# Patient Record
Sex: Male | Born: 1965
Health system: Southern US, Community
[De-identification: ages and names within clinical notes are randomized; demographics above are authoritative.]

## PROBLEM LIST (undated history)

## (undated) DIAGNOSIS — G4730 Sleep apnea, unspecified: Secondary | ICD-10-CM

## (undated) DIAGNOSIS — I872 Venous insufficiency (chronic) (peripheral): Secondary | ICD-10-CM

## (undated) DIAGNOSIS — M1990 Unspecified osteoarthritis, unspecified site: Secondary | ICD-10-CM

## (undated) DIAGNOSIS — E669 Obesity, unspecified: Secondary | ICD-10-CM

## (undated) DIAGNOSIS — E119 Type 2 diabetes mellitus without complications: Secondary | ICD-10-CM

## (undated) DIAGNOSIS — I2699 Other pulmonary embolism without acute cor pulmonale: Secondary | ICD-10-CM

## (undated) DIAGNOSIS — I10 Essential (primary) hypertension: Secondary | ICD-10-CM

## (undated) DIAGNOSIS — M109 Gout, unspecified: Secondary | ICD-10-CM

## (undated) DIAGNOSIS — E785 Hyperlipidemia, unspecified: Secondary | ICD-10-CM

## (undated) HISTORY — PX: NO PAST SURGERIES: SHX2092

## (undated) HISTORY — DX: Sleep apnea, unspecified: G47.30

## (undated) HISTORY — DX: Type 2 diabetes mellitus without complications: E11.9

## (undated) HISTORY — DX: Hyperlipidemia, unspecified: E78.5

## (undated) HISTORY — DX: Venous insufficiency (chronic) (peripheral): I87.2

## (undated) HISTORY — DX: Unspecified osteoarthritis, unspecified site: M19.90

---

## 2002-12-24 ENCOUNTER — Emergency Department (HOSPITAL_COMMUNITY): Admission: EM | Admit: 2002-12-24 | Discharge: 2002-12-25 | Payer: Self-pay | Admitting: Emergency Medicine

## 2011-11-23 ENCOUNTER — Other Ambulatory Visit: Payer: Self-pay | Admitting: Neurology

## 2011-11-23 DIAGNOSIS — M48061 Spinal stenosis, lumbar region without neurogenic claudication: Secondary | ICD-10-CM

## 2011-11-23 DIAGNOSIS — M549 Dorsalgia, unspecified: Secondary | ICD-10-CM

## 2012-04-15 ENCOUNTER — Encounter (HOSPITAL_COMMUNITY): Payer: Self-pay | Admitting: *Deleted

## 2012-04-15 ENCOUNTER — Emergency Department (HOSPITAL_COMMUNITY)
Admission: EM | Admit: 2012-04-15 | Discharge: 2012-04-15 | Disposition: A | Discharge status: Home / Self Care | Payer: BC Managed Care – PPO | Attending: Emergency Medicine | Admitting: Emergency Medicine

## 2012-04-15 DIAGNOSIS — I8002 Phlebitis and thrombophlebitis of superficial vessels of left lower extremity: Secondary | ICD-10-CM

## 2012-04-15 DIAGNOSIS — M7989 Other specified soft tissue disorders: Secondary | ICD-10-CM

## 2012-04-15 DIAGNOSIS — M79609 Pain in unspecified limb: Secondary | ICD-10-CM | POA: Insufficient documentation

## 2012-04-15 DIAGNOSIS — Z7982 Long term (current) use of aspirin: Secondary | ICD-10-CM | POA: Insufficient documentation

## 2012-04-15 DIAGNOSIS — I10 Essential (primary) hypertension: Secondary | ICD-10-CM | POA: Insufficient documentation

## 2012-04-15 DIAGNOSIS — I8000 Phlebitis and thrombophlebitis of superficial vessels of unspecified lower extremity: Secondary | ICD-10-CM | POA: Insufficient documentation

## 2012-04-15 DIAGNOSIS — M109 Gout, unspecified: Secondary | ICD-10-CM | POA: Insufficient documentation

## 2012-04-15 DIAGNOSIS — Z79899 Other long term (current) drug therapy: Secondary | ICD-10-CM | POA: Insufficient documentation

## 2012-04-15 DIAGNOSIS — E669 Obesity, unspecified: Secondary | ICD-10-CM | POA: Insufficient documentation

## 2012-04-15 HISTORY — DX: Gout, unspecified: M10.9

## 2012-04-15 HISTORY — DX: Essential (primary) hypertension: I10

## 2012-04-15 HISTORY — DX: Obesity, unspecified: E66.9

## 2012-04-15 MED ORDER — IBUPROFEN 600 MG PO TABS: 600.0000 mg | ORAL_TABLET | Freq: Four times a day (QID) | ORAL | Status: DC | PRN

## 2012-04-15 NOTE — ED Provider Notes (Signed)
History   47 year old male with atraumatic right lower extremity pain. Gradual onset about 3 days ago. Patient currently feels a "knot" to the medial aspect of his shin. Mild swelling of the same leg. No acute numbness, tingling loss of strength. No fevers or chills. No history of diabetes. No intervention prior to Arrival.  CSN: [id number]  Arrival date & time 04/15/12  1143   First MD Initiated Contact with Patient 04/15/12 1303      Chief Complaint  Patient presents with  . Leg Swelling    (Consider location/radiation/quality/duration/timing/severity/associated sxs/prior treatment) HPI  Past Medical History  Diagnosis Date  . Gout   . Hypertension   . Obesity     History reviewed. No pertinent past surgical history.  History reviewed. No pertinent family history.  History  Substance Use Topics  . Smoking status: Not on file  . Smokeless tobacco: Not on file  . Alcohol Use: No      Review of Systems  All systems reviewed and negative, other than as noted in HPI.   Allergies  Review of patient's allergies indicates no known allergies.  Home Medications   Current Outpatient Rx  Name  Route  Sig  Dispense  Refill  . aspirin 81 MG tablet   Oral   Take 81 mg by mouth daily.         . Azilsartan Medoxomil (EDARBI) 80 MG TABS   Oral   Take 80 mg by mouth daily.         . Febuxostat (ULORIC) 80 MG TABS   Oral   Take 80 mg by mouth daily.         Marland Kitchen lovastatin (MEVACOR) 20 MG tablet   Oral   Take 20 mg by mouth at bedtime.           BP 138/83  Pulse 78  Temp(Src) 98.6 F (37 C) (Oral)  Resp 18  SpO2 96%  Physical Exam  Nursing note and vitals reviewed. Constitutional: He appears well-developed and well-nourished. No distress.  HENT:  Head: Normocephalic and atraumatic.  Eyes: Conjunctivae are normal. Right eye exhibits no discharge. Left eye exhibits no discharge.  Neck: Neck supple.  Cardiovascular: Normal rate, regular rhythm and  normal heart sounds.  Exam reveals no gallop and no friction rub.   No murmur heard. Pulmonary/Chest: Effort normal and breath sounds normal. No respiratory distress.  Abdominal: Soft. He exhibits no distension. There is no tenderness.  Musculoskeletal: He exhibits no edema and no tenderness.  Neurological: He is alert.  Skin: Skin is warm and dry.  Chronic appearing skin changes to bilateral shins. NO evidence of cellulitis. Right lower extremity swelling right, worse than left. Tenderness over the mid shin to the medial aspect. Good DP pulses bilaterally.   Psychiatric: He has a normal mood and affect. His behavior is normal. Thought content normal.    ED Course  Procedures (including critical care time)  Labs Reviewed - No data to display No results found.   1. Superficial phlebitis of left leg       MDM  47 year old male with atraumatic right lower extremity pain and swelling. Ultrasound significant for superficial phlebitis. No evidence of deep vein thrombosis. Plan NSAIDs and warm compresses.         Raeford Razor, MD 04/17/12 2008

## 2012-04-15 NOTE — Progress Notes (Signed)
VASCULAR LAB PRELIMINARY  PRELIMINARY  PRELIMINARY  PRELIMINARY  Right lower extremity venous Doppler completed.    Preliminary report:  There is no DVT noted in the right lower extremity.  There is superficial thrombophlebitis noted in a varicose vein from approximately 4 inches above ankle to mid calf.   KANADY, CANDACE, RVT 04/15/2012, 2:50 PM

## 2012-04-15 NOTE — ED Notes (Signed)
Pt reports having "knot" to right lower leg, having pain, redness, swelling and reports its warm to touch. States that his lower leg is normally discolored and swollen, but it is worse than it normally is. Ambulatory at triage.

## 2012-05-10 ENCOUNTER — Other Ambulatory Visit: Payer: Self-pay | Admitting: Neurology

## 2012-05-10 DIAGNOSIS — M549 Dorsalgia, unspecified: Secondary | ICD-10-CM

## 2012-05-10 DIAGNOSIS — M48061 Spinal stenosis, lumbar region without neurogenic claudication: Secondary | ICD-10-CM

## 2012-05-10 DIAGNOSIS — Z01818 Encounter for other preprocedural examination: Secondary | ICD-10-CM

## 2012-05-17 ENCOUNTER — Ambulatory Visit
Admission: RE | Admit: 2012-05-17 | Discharge: 2012-05-17 | Disposition: A | Discharge status: Home / Self Care | Payer: No Typology Code available for payment source | Source: Ambulatory Visit | Attending: Neurology | Admitting: Neurology

## 2012-05-17 ENCOUNTER — Ambulatory Visit
Admission: RE | Admit: 2012-05-17 | Discharge: 2012-05-17 | Disposition: A | Discharge status: Home / Self Care | Payer: Self-pay | Source: Ambulatory Visit | Attending: Neurology | Admitting: Neurology

## 2012-05-17 ENCOUNTER — Other Ambulatory Visit: Payer: Self-pay

## 2012-05-17 DIAGNOSIS — Z01818 Encounter for other preprocedural examination: Secondary | ICD-10-CM

## 2012-05-17 DIAGNOSIS — M549 Dorsalgia, unspecified: Secondary | ICD-10-CM

## 2012-05-17 DIAGNOSIS — M48061 Spinal stenosis, lumbar region without neurogenic claudication: Secondary | ICD-10-CM

## 2012-05-17 DIAGNOSIS — R209 Unspecified disturbances of skin sensation: Secondary | ICD-10-CM

## 2012-06-07 ENCOUNTER — Telehealth: Payer: Self-pay | Admitting: Neurology

## 2012-06-07 NOTE — Telephone Encounter (Signed)
Patient called to getrresults of MRI. Information provided with brief explanation of findings. Patient reports that he has had increased pain recently. Advised patient to schedule follow up appointment with increased symptoms. Patient would also like a copy of MRI results and will send in a medical release of information request.

## 2012-07-23 ENCOUNTER — Encounter: Payer: Self-pay | Admitting: Neurology

## 2012-07-23 ENCOUNTER — Ambulatory Visit (INDEPENDENT_AMBULATORY_CARE_PROVIDER_SITE_OTHER): Payer: BC Managed Care – PPO | Admitting: Neurology

## 2012-07-23 VITALS — BP 146/85 | HR 76 | Temp 98.1°F | Ht 72.0 in | Wt 391.0 lb

## 2012-07-23 DIAGNOSIS — M545 Low back pain, unspecified: Secondary | ICD-10-CM

## 2012-07-23 DIAGNOSIS — R200 Anesthesia of skin: Secondary | ICD-10-CM

## 2012-07-23 DIAGNOSIS — M549 Dorsalgia, unspecified: Secondary | ICD-10-CM | POA: Insufficient documentation

## 2012-07-23 DIAGNOSIS — G8929 Other chronic pain: Secondary | ICD-10-CM

## 2012-07-23 DIAGNOSIS — M48061 Spinal stenosis, lumbar region without neurogenic claudication: Secondary | ICD-10-CM

## 2012-07-23 NOTE — Progress Notes (Signed)
[facility]  PATIENT: Earl Donovan DOB: 05-11-1965   HISTORY FROM: patient, chart REASON FOR VISIT: routine follow up  HISTORY OF PRESENT ILLNESS:  47 year old morbidly obese Caucasian male with chronic low back pain and numbness likely of musculoskeletal etiology but onset with exertion and relief with rest raises concern for spinal stenosis.  UPDATE 01/25/12: He returns for f/u after last visit on 11/22/11.  He admits to noticing improvement in his low back pain and numbness while he was doing regular back exercises.  He had slight cough in the last month and noticed worsening of his back pain again.  He has not scheduled MRI scan of his spine yet due to financial constraints and may do so next year.  He had a NCV/EMG study on 11/24/11 which showed no evidence of neuropathy or radiculopathy.  He states he has been on diet and trying to lose weight.  He has no new complaints today.  UPDATE 07/23/12: He returns for f/u visit to get results of MRI lumbar spine.  He has no new complaints.  He admits his back felt better when he did back exercises 4-5 days per week but he has only been doing them once or twice a week for the last few months.  His weight has increased about five lbs. since last visit.  He thinks late-night eating is one of his barriers.  He states he knows he needs to loose weight but he has trouble sticking to a diet.  He states he has trouble exercising because his back hurts and goes numb with exertion.  He denies pain or numbness in his legs or feet.  He states he has frequent loose stools with occasional blood (not bright red) intermixed with stool.  REVIEW OF SYSTEMS: Full 14 system review of systems performed and notable only for: endocrine: flushing gastrointestinal: blood in stool sleep: snoring   ALLERGIES: No Known Allergies  HOME MEDICATIONS: Outpatient Prescriptions Prior to Visit  Medication Sig Dispense Refill  . aspirin 81 MG tablet Take  81 mg by mouth daily.      . Febuxostat (ULORIC) 80 MG TABS Take 80 mg by mouth daily.      Marland Kitchen lovastatin (MEVACOR) 20 MG tablet Take 20 mg by mouth at bedtime.      . Azilsartan Medoxomil (EDARBI) 80 MG TABS Take 80 mg by mouth daily.      Marland Kitchen ibuprofen (ADVIL,MOTRIN) 600 MG tablet Take 1 tablet (600 mg total) by mouth every 6 (six) hours as needed for pain.  30 tablet  0   No facility-administered medications prior to visit.    PAST MEDICAL HISTORY: Past Medical History  Diagnosis Date  . Gout   . Hypertension   . Obesity     PAST SURGICAL HISTORY: No past surgical history on file.  FAMILY HISTORY: Family History  Problem Relation Age of Onset  . Arthritis Mother   . Cancer Mother   . Cancer Father   . Asthma Maternal Grandmother   . Asthma Maternal Grandfather     SOCIAL HISTORY: History   Social History  . Marital Status: Married    Spouse Name: N/A    Number of Children: N/A  . Years of Education: N/A   Occupational History  . Not on file.   Social History Main Topics  . Smoking status: Never Smoker   . Smokeless tobacco: Not on file  . Alcohol Use: No  . Drug Use: No  . Sexually  Active: Not on file   Other Topics Concern  . Not on file   Social History Narrative  . No narrative on file     PHYSICAL EXAM  Filed Vitals:   07/23/12 1426  BP: 146/85  Pulse: 76  Temp: 98.1 F (36.7 C)  TempSrc: Oral  Height: 6' (1.829 m)  Weight: 391 lb (177.356 kg)   Body mass index is 53.02 kg/(m^2).  GENERAL EXAM: Patient is in no distress, well developed and well groomed. HEAD: Symmetric facial features. EARS, NOSE, and THROAT: Normal.  NECK: Supple, no JVD RESPIRATORY: Lungs CTA. CARDIOVASCULAR: Regular rate and rhythm, no murmurs, no carotid bruits SKIN: No rash, no bruising  NEUROLOGIC: MENTAL STATUS: awake, alert and oriented to person, place and time, language fluent, comprehension intact, naming intact CRANIAL NERVE: pupils equal and reactive to  light, visual fields full to confrontation, extraocular muscles intact, no nystagmus, facial sensation and strength symmetric, uvula midline, shoulder shrug symmetric, tongue midline. MOTOR: normal bulk and tone, full strength in the BUE, BLE, fine finger movements normal. SENSORY: normal and symmetric to light touch, pinprick, temperature, vibration  COORDINATION: finger-nose-finger normal REFLEXES: deep tendon reflexes present and symmetric 2+,  GAIT/STATION: narrow based gait; able to walk on toes, heels.  Tandem UNSTEADY; romberg is negative.     DIAGNOSTIC DATA (LABS, IMAGING, TESTING) - I reviewed patient records, labs, notes, testing and imaging myself where available.   11/23/11: MRI lumbar spine without This MRI scan of the lumbar spine shows only mild disc desiccation changes at L3-4 and L4-5 and minor facet hypertrophy but no significant compression.   ASSESSMENT AND PLAN  47 year old morbidly obese Caucasian male with chronic low back pain and numbness of musculoskeletal etiology.  Workup for spinal stenosis negative.  Plan:  I have reviewed results from MRI lumbar spine with patient.  Advised patient to do regular back stretching exercises 4-5 days per week, lose weight with lifestyle modifications including daily exercise. Suggested stationary bicycling, walking or working out in the pool as warm water may soothe the back and provide non-weight-bearing exercise.  Encouraged patient to start slow and build up exercise tolerance.  Advised him to make dietary changes to reduce caloric intake and cut out late-night eating. And lose weight.  Encouraged patient to discuss GI problems with his PCP.  No future appointment scheduled.   LYNN LAM NP-C 07/23/2012, 3:09 PM  [facility] [address] [place], [place] [address] [phone]  I have personally examined this patient, reviewed pertinent data, developed plan of care and discussed with patient and  agree with above. Delia Heady, MD

## 2012-07-23 NOTE — Patient Instructions (Signed)
Return as needed.  No future appointment needed.

## 2013-08-20 IMAGING — CR DG ORBITS FOR FOREIGN BODY
2 series · 2 of 2 positions shown · non-contrast
Comparison: None.

CLINICAL DATA: Pre MRI; history of previous metal work

ORBITS FOR FOREIGN BODY - 2 VIEW

[view not recorded (1 of 2)]
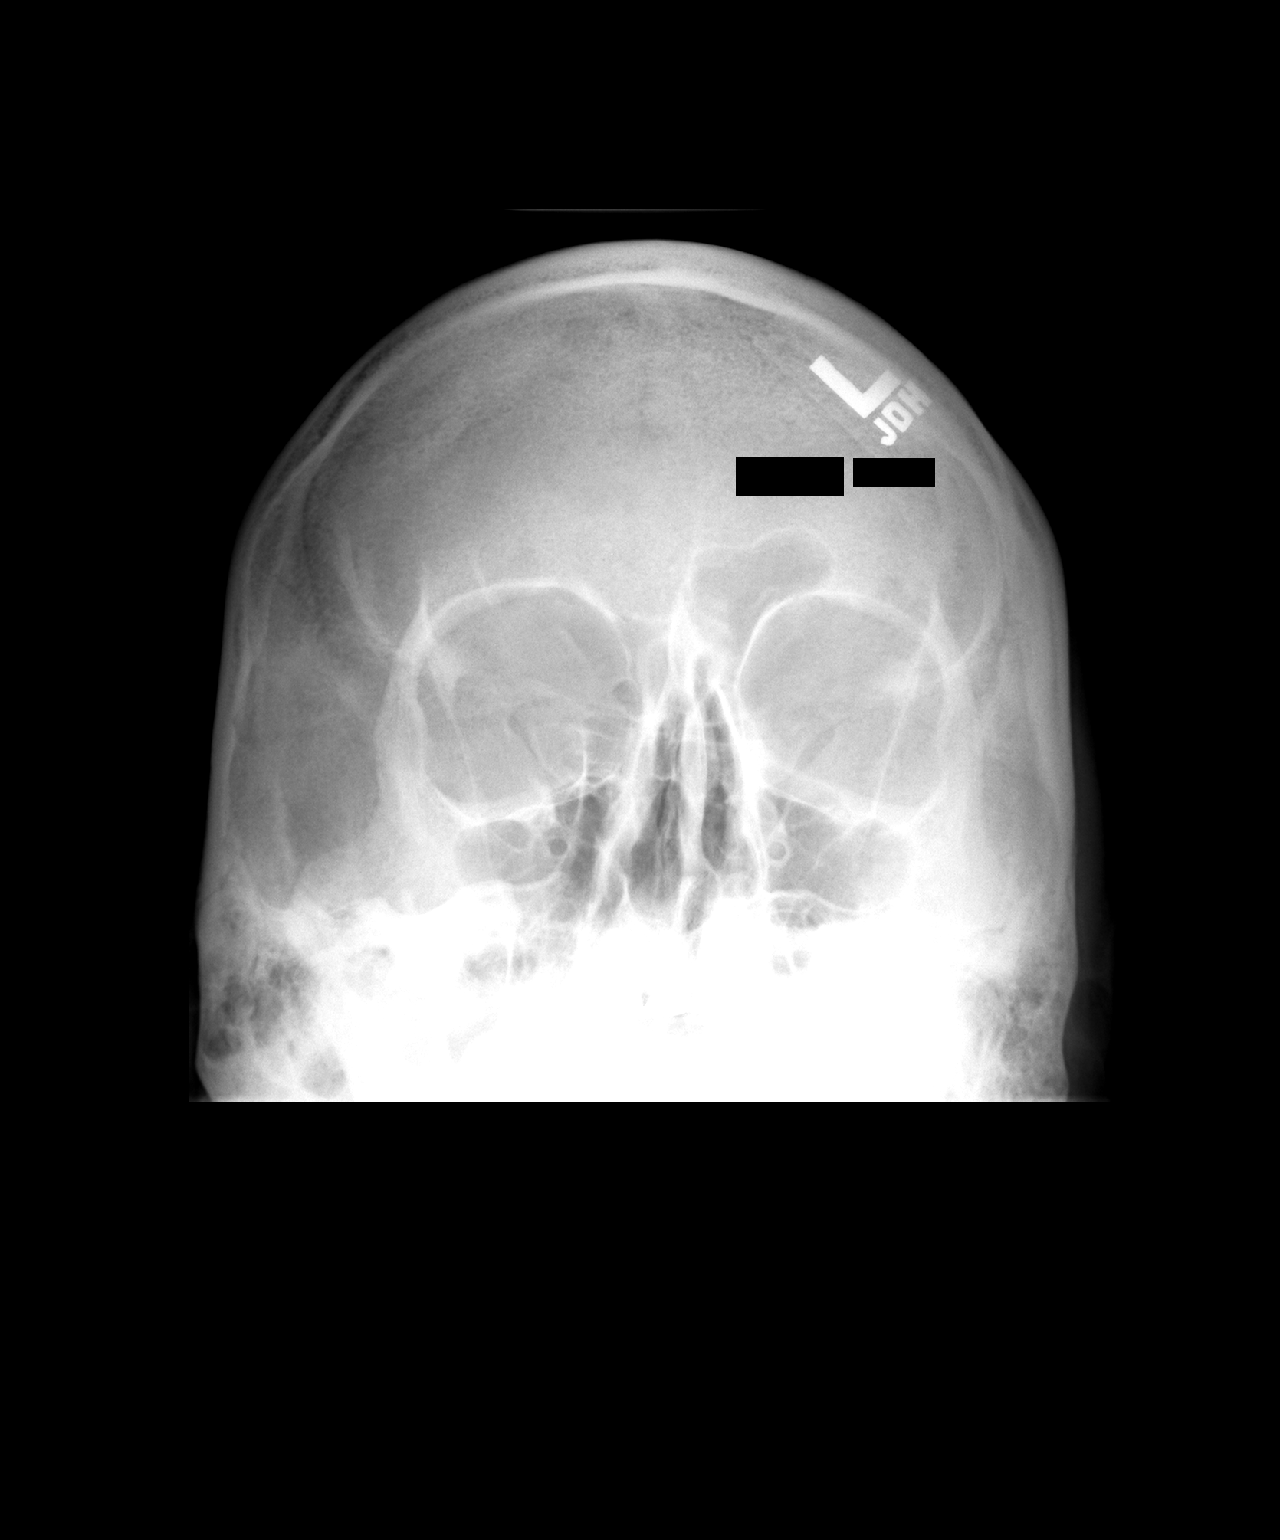

[view not recorded (2 of 2)]
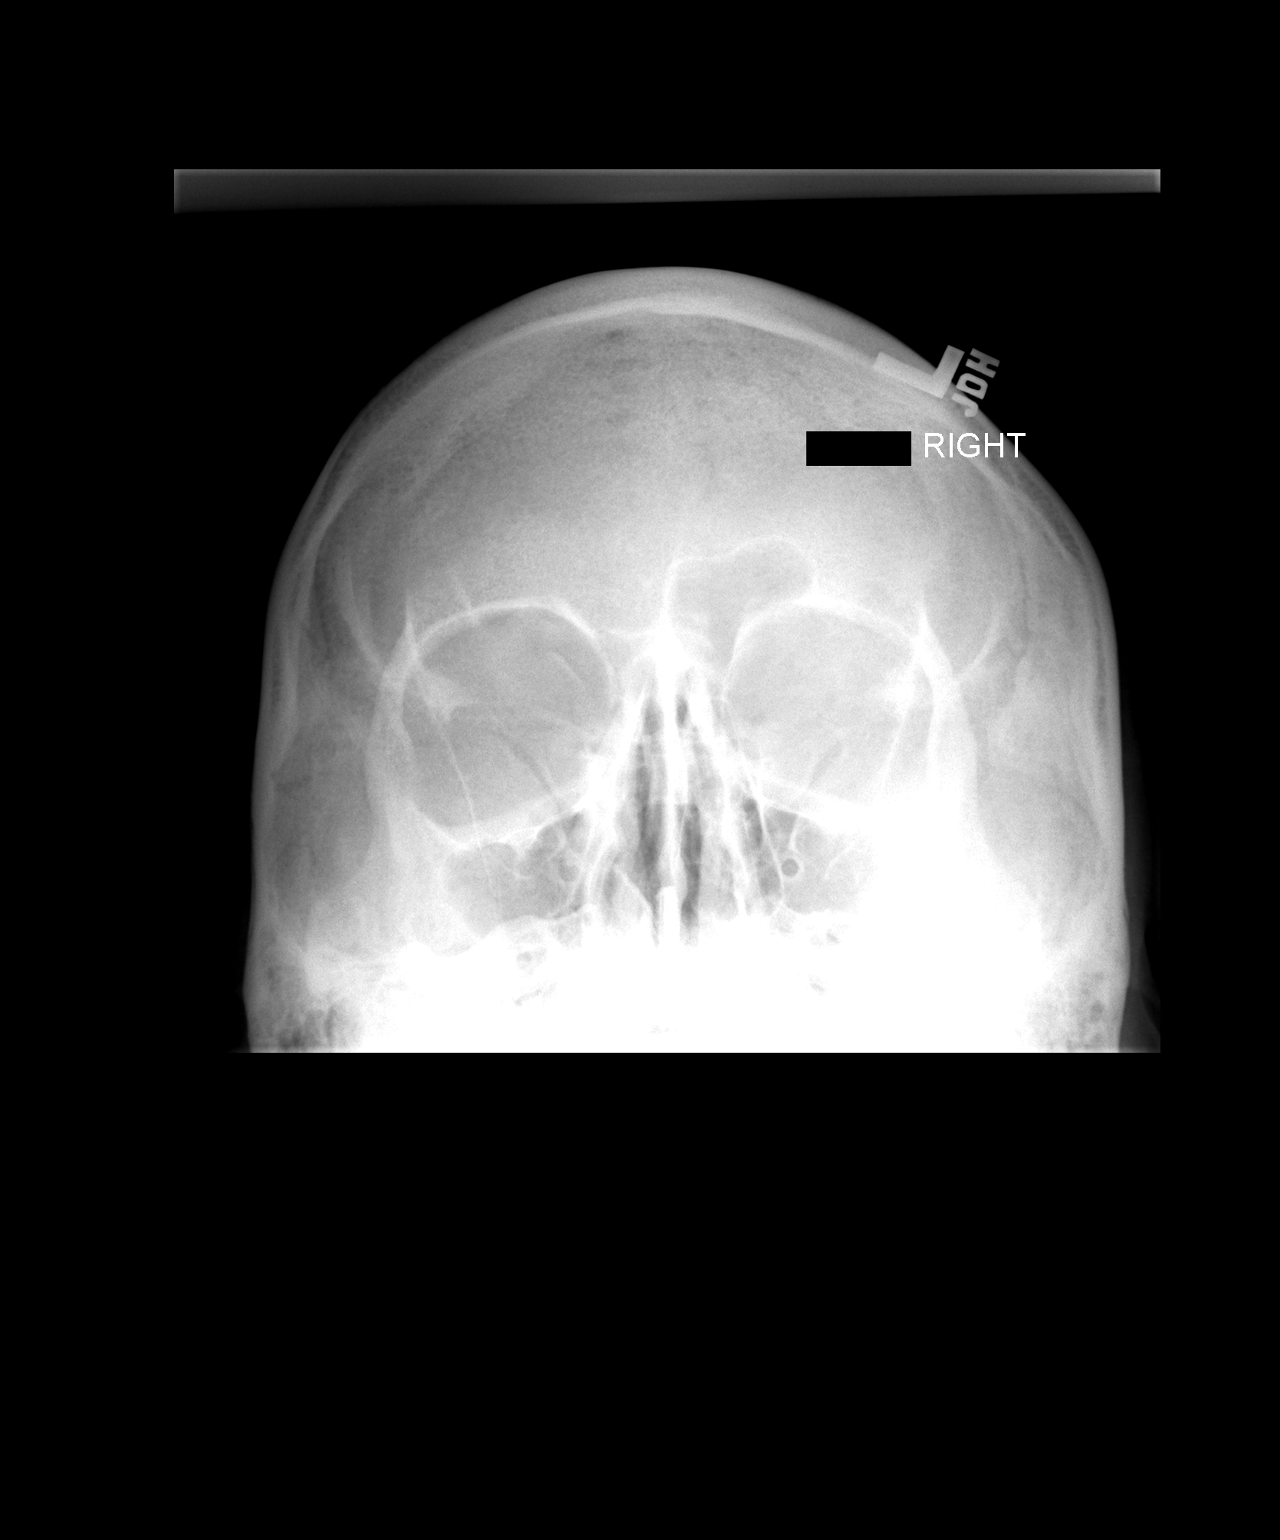

[2 of 2 positions shown; findings below may reference images not displayed]

FINDINGS: Water's views with eyes deviated toward the left and
toward the right were obtained.  There is no intraorbital
radiopaque foreign body.  Frontal sinuses are hypoplastic.  Aerated
paranasal sinuses appear clear.  No fracture or dislocation.
IMPRESSION: No intraorbital radiopaque foreign body.

## 2015-07-31 ENCOUNTER — Encounter: Payer: Self-pay | Admitting: Family Medicine

## 2015-07-31 ENCOUNTER — Ambulatory Visit (INDEPENDENT_AMBULATORY_CARE_PROVIDER_SITE_OTHER): Payer: BLUE CROSS/BLUE SHIELD | Admitting: Family Medicine

## 2015-07-31 VITALS — BP 140/90 | HR 66 | Temp 98.4°F | Resp 12 | Ht 72.0 in | Wt 395.4 lb

## 2015-07-31 DIAGNOSIS — E785 Hyperlipidemia, unspecified: Secondary | ICD-10-CM

## 2015-07-31 DIAGNOSIS — I8311 Varicose veins of right lower extremity with inflammation: Secondary | ICD-10-CM

## 2015-07-31 DIAGNOSIS — G4733 Obstructive sleep apnea (adult) (pediatric): Secondary | ICD-10-CM | POA: Insufficient documentation

## 2015-07-31 DIAGNOSIS — I872 Venous insufficiency (chronic) (peripheral): Secondary | ICD-10-CM | POA: Insufficient documentation

## 2015-07-31 DIAGNOSIS — I8312 Varicose veins of left lower extremity with inflammation: Secondary | ICD-10-CM

## 2015-07-31 DIAGNOSIS — M549 Dorsalgia, unspecified: Secondary | ICD-10-CM

## 2015-07-31 DIAGNOSIS — Z9989 Dependence on other enabling machines and devices: Secondary | ICD-10-CM

## 2015-07-31 DIAGNOSIS — M109 Gout, unspecified: Secondary | ICD-10-CM | POA: Insufficient documentation

## 2015-07-31 DIAGNOSIS — Z6841 Body Mass Index (BMI) 40.0 and over, adult: Secondary | ICD-10-CM

## 2015-07-31 DIAGNOSIS — I10 Essential (primary) hypertension: Secondary | ICD-10-CM | POA: Diagnosis not present

## 2015-07-31 DIAGNOSIS — M1009 Idiopathic gout, multiple sites: Secondary | ICD-10-CM

## 2015-07-31 DIAGNOSIS — Z1211 Encounter for screening for malignant neoplasm of colon: Secondary | ICD-10-CM | POA: Diagnosis not present

## 2015-07-31 DIAGNOSIS — G8929 Other chronic pain: Secondary | ICD-10-CM

## 2015-07-31 LAB — BASIC METABOLIC PANEL
BUN: 12 mg/dL (ref 6–23)
CO2: 30 mEq/L (ref 19–32)
Calcium: 9.6 mg/dL (ref 8.4–10.5)
Chloride: 101 mEq/L (ref 96–112)
Creatinine, Ser: 0.88 mg/dL (ref 0.40–1.50)
GFR: 97.33 mL/min (ref >60.00)
Glucose, Bld: 133 mg/dL — ABNORMAL HIGH (ref 70–99)
Potassium: 4 mEq/L (ref 3.5–5.1)
Sodium: 137 mEq/L (ref 135–145)

## 2015-07-31 NOTE — Patient Instructions (Signed)
A few things to remember from today's visit:   1. Venous stasis dermatitis of both lower extremities   2. BMI 50.0-59.9, adult (HCC)   3. Essential hypertension, benign  - Basic Metabolic Panel  4. Colon cancer screening  - Ambulatory referral to Gastroenterology  5. Gout, arthropathy   6. Hyperlipidemia   7. Back pain, chronic    Blood pressure goal for most people is less than 140/90.Some populations (older than 60) the goal is less than 150/90.  Elevated blood pressure increases the risk of strokes, heart and kidney disease, and eye problems. Regular physical activity and a healthy diet (DASH diet) usually help. Low salt diet. Take medications as instructed. Caution with some over the counter medications as cold medications, dietary products (for weight loss), and Ibuprofen or Aleve (frequent use);all these medications could cause elevation of blood pressure.   No changes in current medications. Continue healthy diet and regular physical activity. I will see you back in 4 months, then we can repeat cholesterol lab and check for diabetes.          If you sign-up for My chart, you can communicate easier with us in case you have any question or concern.

## 2015-07-31 NOTE — Progress Notes (Signed)
Pre visit review using our clinic review tool, if applicable. No additional management support is needed unless otherwise documented below in the visit note. 

## 2015-07-31 NOTE — Progress Notes (Signed)
HPI:   [name] is a 50 y.o. male, who is here today to establish care with me.  Former PCP: Dr Orvan Falconerampbell. Last preventive routine visit: 02/2015.  Hx of HTN,gout,HLD,lower back pain, and obesity among some.  Concerns today: LE edema for years, worse at the end of the day. Elevation and compression stocking help. Stable. He states that his wife whnted him to mention it.  04/2012: Lower ext venous Doppler:No evidence of deep vein thrombosis involving the right  lower extremity. There is superficial thrombophlebitis  noted in a varicose vein approximately 4 inches above calf  extending to mid calf. - No evidence of Baker's cyst on the right.   Hypertension: Dx 10 years ago.  Reporting labs done last in 02/2015. + OSA, started CPaP about a month ago. Currently he is on Losartan 100 mg daily.  Exercising and following a healthy/low salt diet.   He has not noted unusual headache, visual changes, exertional chest pain,dyspnea, focal weakness, or worsening edema. Tolerating medications well with no major side effects reported.   BP reading at home.140-150/80's. Started exercising and following a healthier diet for the past 2 weeks.   Reporting Hx of pre-diabetes.    Back pain:  4-5 years, intermittent, no radiated. He has followed with neurosurgeon in 2013.  05/2012: Lumbar WUJ:WJXBRI:This MRI scan of the lumbar spine shows only mild disc desiccation changes at L3-4 and L4-5 and minor facet hypertrophy but no significant compression.  07/2012: He had a NCV/EMG study on 11/24/11 which showed no evidence of neuropathy or radiculopathy   Pain is not associated LE numbness, tingling, urinary incontinence or retention, stool incontinence, or saddle anesthesia.  No rash or edema on area, fever, chills, or abnormal wt loss.  Exacerbated by prolonged walking, pain is mild and does not limit daily activities. + lower back numbness also when prolonged walking, it is  moderated and this is the reason he stopped exercising before. He has not had any since he resume exercise. Alleviated by bending forward.  Gout: last 7 years ago when he started Uloric 80 mg daily.He tried Allopurinol first but did not help.    HLD:  He was on Lovastatin, he discontinued, he states that he does not like to take medications if he does not feel like it is needed. He is trying to do better with healthy diet. Denies any Hx of CVD. Currently on Aspirin.     Review of Systems  Constitutional: Negative for fever, activity change, appetite change, fatigue and unexpected weight change.  HENT: Negative for nosebleeds, sore throat and trouble swallowing.   Eyes: Negative for pain, redness and visual disturbance.  Respiratory: Positive for apnea (on CPaP). Negative for cough, shortness of breath and wheezing.   Cardiovascular: Positive for leg swelling. Negative for chest pain and palpitations.  Gastrointestinal: Negative for nausea, vomiting, abdominal pain and constipation.       + Has had blood on tissue after defecation, has not had any in 6 months.  No changes in bowel habits.  Endocrine: Negative for polydipsia, polyphagia and polyuria.  Genitourinary: Negative for dysuria, hematuria and decreased urine volume.  Musculoskeletal: Positive for back pain. Negative for myalgias, arthralgias and gait problem.  Skin: Positive for rash (LE, stable.). Negative for wound.  Neurological: Negative for dizziness, seizures, syncope, weakness and headaches.  Psychiatric/Behavioral: Negative for confusion. The patient is not nervous/anxious.       Current Outpatient Prescriptions on File Prior to Visit  Medication  Sig Dispense Refill  . aspirin 81 MG tablet Take 81 mg by mouth daily.    . Febuxostat (ULORIC) 80 MG TABS Take 80 mg by mouth daily.     No current facility-administered medications on file prior to visit.     Past Medical History  Diagnosis Date  . Gout   .  Hypertension   . Obesity    No Known Allergies  Family History  Problem Relation Age of Onset  . Arthritis Mother   . Cancer Mother   . Cancer Father   . Asthma Maternal Grandmother   . Asthma Maternal Grandfather     Social History   Social History  . Marital Status: Married    Spouse Name: N/A  . Number of Children: N/A  . Years of Education: N/A   Social History Main Topics  . Smoking status: Never Smoker   . Smokeless tobacco: None  . Alcohol Use: No  . Drug Use: No  . Sexual Activity: Not Asked   Other Topics Concern  . None   Social History Narrative    Filed Vitals:   07/31/15 1301  BP: 140/90  Pulse: 66  Temp: 98.4 F (36.9 C)    Body mass index is 53.61 kg/(m^2).   SpO2 Readings from Last 3 Encounters:  07/31/15 97%  04/15/12 97%     Physical Exam  Constitutional: He is oriented to person, place, and time. He appears well-developed. No distress.  HENT:  Head: Atraumatic.  Mouth/Throat: Oropharynx is clear and moist and mucous membranes are normal.  Eyes: Conjunctivae and EOM are normal. Pupils are equal, round, and reactive to light.  Neck: No thyromegaly present.  Cardiovascular: Normal rate and regular rhythm.   No murmur heard. Pulses:      Dorsalis pedis pulses are 2+ on the right side, and 2+ on the left side.  Respiratory: Effort normal and breath sounds normal. No respiratory distress.  GI: Soft. He exhibits no mass. There is no hepatomegaly. There is no tenderness.  Musculoskeletal: He exhibits no edema or tenderness.  No significant deformity appreciated. No tenderness upon palpation of paraspinal muscles. Pain is not elicited with movement on exam table during examination.    Lymphadenopathy:    He has no cervical adenopathy.  Neurological: He is alert and oriented to person, place, and time. He has normal strength. Coordination and gait normal.  SLR negative bilateral.  Skin: Skin is warm. Rash (Hyperpigmentation changes,  pretibial bilateral.Scaly skin, no wounds or drainage.) noted. No lesion noted. No erythema.  Psychiatric: He has a normal mood and affect. His speech is normal.  Well groomed, good eye contact.      ASSESSMENT AND PLAN:    Saba was seen today for new patient (initial visit).  Diagnoses and all orders for this visit:  Venous stasis dermatitis of both lower extremities  Stable. Skin care discussed, moisturized with Vaseline at bedtime. LE elevation and compression stockings. I do not think further study is needed for now.  Essential hypertension, benign  Slightly above goal, ideally < 140/90. No changes in current management. DASH diet recommended. Eye exam recommended annually. F/U in 4 months, before if needed. Instructed to continue monitoring BP at home and bring readings.  BMI 50.0-59.9, adult (HCC)  We discussed benefits of wt loss as well as adverse effects of obesity. Consistency with healthy diet and physical activity recommended. Weight Watchers is a good option as well as daily brisk walking for 15-30 min as tolerated.  Colon cancer screening  Vs Dx, reporting occasional blood in stool, none in 6 months. FHx negative for colon cancer.  -     Ambulatory referral to Gastroenterology  Gout, arthropathy  Asymptomatic. No changes in current management. Some side effects of Uloric discussed. F/U in 4 months.  Hyperlipidemia  Low fat diet to continue for now. FLP next OV.   Back pain, chronic  Stable and reported as mild. Continue stretching exercises. Wt loss may help. Monitor for warning signs. F/U annually, before if needed.        -PMHx reviewed. -Will obtain records from former PCP.        Betty G. [place], MD  [facility]. [place] office.

## 2015-08-14 ENCOUNTER — Encounter: Payer: Self-pay | Admitting: Family Medicine

## 2015-09-13 ENCOUNTER — Encounter (HOSPITAL_COMMUNITY): Payer: Self-pay

## 2015-09-13 ENCOUNTER — Emergency Department (HOSPITAL_COMMUNITY)
Admission: EM | Admit: 2015-09-13 | Discharge: 2015-09-13 | Disposition: A | Discharge status: Home / Self Care | Payer: BLUE CROSS/BLUE SHIELD | Attending: Emergency Medicine | Admitting: Emergency Medicine

## 2015-09-13 DIAGNOSIS — M545 Low back pain, unspecified: Secondary | ICD-10-CM

## 2015-09-13 DIAGNOSIS — Z79899 Other long term (current) drug therapy: Secondary | ICD-10-CM | POA: Insufficient documentation

## 2015-09-13 DIAGNOSIS — I10 Essential (primary) hypertension: Secondary | ICD-10-CM | POA: Diagnosis not present

## 2015-09-13 DIAGNOSIS — Z7982 Long term (current) use of aspirin: Secondary | ICD-10-CM | POA: Diagnosis not present

## 2015-09-13 MED ORDER — CYCLOBENZAPRINE HCL 10 MG PO TABS: 10.0000 mg | ORAL_TABLET | Freq: Two times a day (BID) | ORAL | 0 refills | Status: DC | PRN

## 2015-09-13 MED ORDER — NAPROXEN 500 MG PO TABS: 500.0000 mg | ORAL_TABLET | Freq: Two times a day (BID) | ORAL | 0 refills | Status: DC

## 2015-09-13 MED ORDER — HYDROCODONE-ACETAMINOPHEN 5-325 MG PO TABS
2.0000 | ORAL_TABLET | Freq: Once | ORAL | Status: AC
  Administered 2015-09-13: 2 via ORAL
  Filled 2015-09-13: qty 2

## 2015-09-13 NOTE — ED Triage Notes (Signed)
Pt states twisted back this am. Denies any fall/trauma. Denies any radiation to legs. Pt states central low back pain.

## 2015-09-13 NOTE — ED Provider Notes (Signed)
[facility]-EMERGENCY DEPT Provider Note   CSN: [id number] Arrival date & time: 09/13/15  2036  First Provider Contact: 11:08 PM   By signing my name below, I, Vista Mink, attest that this documentation has been prepared under the direction and in the presence of [facility]  Electronically Signed: Vista Mink, ED Scribe. 09/14/15. 12:07 AM.   History   Chief Complaint Chief Complaint  Patient presents with  . Back Pain    HPI HPI Comments: Earl Donovan is a 50 y.o. Male with a PMHx of chronic back pain, who presents to the Emergency Department complaining of sudden onset, middle to lower back pain that occurred this morning. Pt was getting ready to go play golf this morning when he twisted and bent down and reports sudden onset of pain to his back ever since. Pt also states he is in no significant pain while resting but reports pain is exacerbated upon ambulation and certain movements. Pt states he took 1 Hydrocodone from an old prescription prior to arrival, around 1900, and reports mild relief. Pt reports Hx of chronic back pain but none similar to this episode. Pt states he has had MRI's of his back in the past, none showed significant findings. Pt denies any numbness or tingling in lower extremities, bowel or bladder incontinence, urinary symptoms, abdominal pain. Pt further denies Hx of IV drug use, GI issues  The history is provided by the patient. No language interpreter was used.   Past Medical History:  Diagnosis Date  . Gout   . Hypertension   . Obesity     Patient Active Problem List   Diagnosis Date Noted  . Hyperlipidemia 07/31/2015  . Gout, arthropathy 07/31/2015  . BMI 50.0-59.9, adult (HCC) 07/31/2015  . Essential hypertension, benign 07/31/2015  . Venous stasis dermatitis of both lower extremities 07/31/2015  . OSA on CPAP 07/31/2015  . Spinal stenosis of lumbar region 07/23/2012  . Numbness 07/23/2012  . Back pain, chronic 07/23/2012     History reviewed. No pertinent surgical history.     Home Medications    Prior to Admission medications   Medication Sig Start Date End Date Taking? Authorizing Provider  aspirin 81 MG tablet Take 81 mg by mouth daily.    Historical Provider, MD  cyclobenzaprine (FLEXERIL) 10 MG tablet Take 1 tablet (10 mg total) by mouth 2 (two) times daily as needed for muscle spasms. 09/13/15   Jerre Simon, PA  Febuxostat (ULORIC) 80 MG TABS Take 80 mg by mouth daily.    Historical Provider, MD  losartan (COZAAR) 100 MG tablet Take 100 mg by mouth daily.    Historical Provider, MD  naproxen (NAPROSYN) 500 MG tablet Take 1 tablet (500 mg total) by mouth 2 (two) times daily. 09/13/15   Jerre Simon, PA    Family History Family History  Problem Relation Age of Onset  . Arthritis Mother   . Cancer Mother   . Cancer Father   . Asthma Maternal Grandmother   . Asthma Maternal Grandfather     Social History Social History  Substance Use Topics  . Smoking status: Never Smoker  . Smokeless tobacco: Never Used  . Alcohol use No     Allergies   Review of patient's allergies indicates no known allergies.   Review of Systems Review of Systems  Constitutional: Negative for fever.  Gastrointestinal: Negative for abdominal pain.  Genitourinary: Negative for frequency and urgency.  Musculoskeletal: Positive for back pain (lower mid back).  Neurological: Negative for numbness.     Physical Exam Updated Vital Signs BP 138/76   Pulse 62   Temp 98.2 F (36.8 C) (Oral)   Resp 18   Ht 6' (1.829 m)   Wt (!) 385 lb (174.6 kg)   SpO2 95%   BMI 52.22 kg/m   Physical Exam  Constitutional: He appears well-developed and well-nourished. No distress.  HENT:  Head: Normocephalic and atraumatic.  Eyes: Conjunctivae are normal.  Pulmonary/Chest: Effort normal. No respiratory distress.  Musculoskeletal: Normal range of motion.  Examination lower back revealed no deformity, step-off,  ecchymosis, edema, no TTP to lumbar spine, mild TTP of the paraspinal muscles bilaterally left greater than right, good range of motion of the T and L-spine, strength 5/5 of bilateral lower extremities, DP pulses 2+ bilaterally, patient neurovascularly intact distally  Neurological: He is alert. Coordination normal.  Skin: Skin is warm and dry. He is not diaphoretic.  Psychiatric: He has a normal mood and affect. His behavior is normal.  Nursing note and vitals reviewed.    ED Treatments / Results  DIAGNOSTIC STUDIES: Oxygen Saturation is 95% on RA, normal by my interpretation.  COORDINATION OF CARE: 11:32 PM-Will order muscle relaxer and continued care of pain. Discussed treatment plan with pt at bedside and pt agreed to plan.   Labs (all labs ordered are listed, but only abnormal results are displayed) Labs Reviewed - No data to display  EKG  EKG Interpretation None       Radiology No results found.  Procedures Procedures (including critical care time)  Medications Ordered in ED Medications  HYDROcodone-acetaminophen (NORCO/VICODIN) 5-325 MG per tablet 2 tablet (2 tablets Oral Given 09/13/15 2325)     Initial Impression / Assessment and Plan / ED Course  I have reviewed the triage vital signs and the nursing notes.  Pertinent labs & imaging results that were available during my care of the patient were reviewed by me and considered in my medical decision making (see chart for details).  Clinical Course    Patient with low back pain.  Denies trauma. No neurological deficits and normal neuro exam.  Patient can walk but states is painful.  No loss of bowel or bladder control.  No concern for cauda equina.  No fever, night sweats, weight loss, h/o cancer, IVDU.  RICE protocol and pain medicine indicated and discussed with patient.Instructed patient to follow-up with his primary care provider and/or neurosurgeon to his back reevaluated to 3 days of symptoms do not improve.  Discussed strict return precautions. Patient expressed understanding to the discharge instructions.  I personally performed the services described in this documentation, which was scribed in my presence. The recorded information has been reviewed and is accurate.     Final Clinical Impressions(s) / ED Diagnoses   Final diagnoses:  Bilateral low back pain without sciatica    New Prescriptions Discharge Medication List as of 09/13/2015 11:25 PM    START taking these medications   Details  cyclobenzaprine (FLEXERIL) 10 MG tablet Take 1 tablet (10 mg total) by mouth 2 (two) times daily as needed for muscle spasms., Starting Sun 09/13/2015, Print    naproxen (NAPROSYN) 500 MG tablet Take 1 tablet (500 mg total) by mouth 2 (two) times daily., Starting Sun 09/13/2015, Print         Jerre Simon, PA 09/14/15 0031    Rolland Porter, MD 09/26/15 2154

## 2015-09-13 NOTE — Discharge Instructions (Signed)
Take the naproxen and Flexeril as prescribed. Be sure to eat when taking the naproxen as it can be hard on your stomach. Do not drive or operate machinery or drink alcohol while on the Flexeril as it can cause drowsiness. Use ice as often as you can, 20 minutes on, 20 minutes off and be sure to keep a thin cloth between your skin and the ice. Follow-up with your primary care provider within 3 days to be reevaluated. If your symptoms do not improve follow-up with your neurosurgeon.  Return to the department if you experience worsening back pain, pain radiating into your legs, numbness/tingling into your legs, weakness, loss of bowel or bladder function, fever, chills, night sweats or any other concerning symptoms.

## 2015-12-07 ENCOUNTER — Ambulatory Visit: Payer: Self-pay | Admitting: Family Medicine

## 2016-08-02 NOTE — Progress Notes (Signed)
HPI:  [name] is a 51 y.o.male here today for his routine physical examination.   He lives with wife, [name].  Regular exercise 3 or more times per week: Walking for 30 min and active with yard work. Following a healthy diet: Not consistently.   Chronic medical problems: HTN,OSA, venus stasis dermatitis, back pain, gout,and HLD among some.He last followed in 07/2015.  Hx of STD's: Denies.  Last colon cancer screening: Referral was placed last OV, 07/2015. He did not keep appt. Last prostate ca screening: Not done before.  Denies dysuria,increased urinary frequency, gross hematuria,or decreased urine output.  -Denies high alcohol intake, tobacco use, or Hx of illicit drug use.  -Concerns and/or follow up today:   Hypertension:   Hx of OSA on CPAP. Currently on Cozaar 100 mg daily.   120'-130's/80's. He has not been consistent with low salt diet. Last eye exam 1.5 years.   He denies headache, visual changes, exertional chest pain, dyspnea,  focal weakness, or edema.   Lab Results  Component Value Date   CREATININE 0.88 07/31/2015   BUN 12 07/31/2015   NA 137 07/31/2015   K 4.0 07/31/2015   CL 101 07/31/2015   CO2 30 07/31/2015   He has had some lightheadedness for the past 2 weeks,intermittently, exacerbated by bending down. He denies associated palpitation,hearing los,tinnitus.   Gout:  He has not had an attack since he started Uloric, 8 years ago. He tried Allopurinol and daily Colchicine but did nit help control attacks. His insurance is not longer covering for this medication, according to pt, he needs a letter from PCP.  Venous insufficiency with stasis dermatitis. He wears compression stockings. Usually symptoms are worse during hot weather. A week ago he scratched RLE while working outdoors. Superficial laceration is healing.  HLD: He is non pharmacologic treatment.  Review of Systems  Constitutional: Negative for activity  change, appetite change, fatigue, fever and unexpected weight change.  HENT: Negative for dental problem, nosebleeds, sore throat, trouble swallowing and voice change.   Eyes: Negative for redness and visual disturbance.  Respiratory: Negative for cough, shortness of breath and wheezing.   Cardiovascular: Positive for leg swelling. Negative for chest pain and palpitations.  Gastrointestinal: Negative for abdominal pain, blood in stool, nausea and vomiting.       No changes in bowel habits.  Endocrine: Negative for cold intolerance, heat intolerance, polydipsia, polyphagia and polyuria.  Genitourinary: Negative for decreased urine volume, dysuria, genital sores, hematuria and testicular pain.  Musculoskeletal: Positive for back pain (Chronic). Negative for gait problem and myalgias.  Skin: Positive for wound. Negative for color change and rash.  Neurological: Positive for light-headedness. Negative for syncope, weakness and headaches.  Hematological: Negative for adenopathy. Does not bruise/bleed easily.  Psychiatric/Behavioral: Negative for confusion and sleep disturbance. The patient is not nervous/anxious.   All other systems reviewed and are negative.    Current Outpatient Prescriptions on File Prior to Visit  Medication Sig Dispense Refill  . aspirin 81 MG tablet Take 81 mg by mouth daily.     No current facility-administered medications on file prior to visit.      Past Medical History:  Diagnosis Date  . Arthritis    Lower back pain  . Gout   . Hypertension   . Obesity     No Known Allergies  Family History  Problem Relation Age of Onset  . Arthritis Mother   . Cancer Mother   . Cancer  Father   . Asthma Maternal Grandmother   . Asthma Maternal Grandfather   . Diabetes Daughter     Social History   Social History  . Marital status: Married    Spouse name: N/A  . Number of children: N/A  . Years of education: N/A   Social History Main Topics  . Smoking  status: Never Smoker  . Smokeless tobacco: Never Used  . Alcohol use No  . Drug use: No  . Sexual activity: Yes   Other Topics Concern  . None   Social History Narrative  . None     Vitals:   08/03/16 0702  BP: 128/80  Pulse: 74  Resp: 12   Body mass index is 54.04 kg/m.  O2 sat at RA 96%  Wt Readings from Last 3 Encounters:  08/03/16 (!) 398 lb 7 oz (180.7 kg)  09/13/15 (!) 385 lb (174.6 kg)  07/31/15 (!) 395 lb 6 oz (179.3 kg)    Physical Exam  Nursing note and vitals reviewed. Constitutional: He is oriented to person, place, and time. He appears well-developed. No distress.  HENT:  Head: Atraumatic.  Right Ear: Tympanic membrane, external ear and ear canal normal.  Left Ear: Tympanic membrane, external ear and ear canal normal.  Mouth/Throat: Oropharynx is clear and moist and mucous membranes are normal.  Eyes: Conjunctivae and EOM are normal. Pupils are equal, round, and reactive to light.  Neck: Normal range of motion. No tracheal deviation present. No thyromegaly present.  Cardiovascular: Normal rate and regular rhythm.   No murmur heard. Pulses:      Dorsalis pedis pulses are 2+ on the right side, and 2+ on the left side.  Respiratory: Effort normal and breath sounds normal. No respiratory distress.  GI: Soft. He exhibits no mass. There is no tenderness.  Genitourinary:  Genitourinary Comments: Chose not to do, no concerns  Musculoskeletal: He exhibits edema (R>L 3+ and 2+ pitting LE edema.). He exhibits no tenderness.  No major deformities appreciated and no signs of synovitis.  Lymphadenopathy:    He has no cervical adenopathy.       Right: No supraclavicular adenopathy present.       Left: No supraclavicular adenopathy present.  Neurological: He is alert and oriented to person, place, and time. He has normal strength. No cranial nerve deficit or sensory deficit. Coordination and gait normal.  Reflex Scores:      Bicep reflexes are 2+ on the right  side and 2+ on the left side.      Patellar reflexes are 2+ on the right side and 2+ on the left side. Skin: Skin is warm. Abrasion noted. There is erythema.     Superficial excoriation lateral aspect of right calf, max diameter 2 cm. No erythema, no drainage, no edema  Hyperpigmentation changes on pretibial area,bilateral.  Psychiatric: He has a normal mood and affect. Cognition and memory are normal.  Well groomed,good eye contact.     ASSESSMENT AND PLAN:   Mikhael was seen today for annual exam.  Diagnoses and all orders for this visit:  Encounter for general adult medical examination with abnormal findings   We discussed the importance of regular physical activity and healthy diet for prevention of chronic illness and/or complications. Preventive guidelines reviewed. Vaccination up to date, per pt report. Aspirin for primary prevention discussed as well as side effects. Next CPE in 1 year.   -     Hemoglobin A1c  Essential hypertension, benign  Adequately controlled.  No changes in current management. DASH-low salt diet to continue. Eye exam recommended annually. F/U in 6 months, before if needed.  -     Comprehensive metabolic panel -     TSH -     losartan (COZAAR) 100 MG tablet; Take 1 tablet (100 mg total) by mouth daily.  Hyperlipidemia, unspecified hyperlipidemia type  Continue low fat diet. Further recommendations will be given according to lab results.  -     Comprehensive metabolic panel -     Lipid panel  Venous stasis dermatitis of both lower extremities  Skin care discussed. Wt loss may also help.  Topical abx to apply on small excoriation x 7 days. Avoid trauma. Continue wearing compression stockings.   -     silver sulfADIAZINE (SILVADENE) 1 % cream; Apply 1 application topically daily.  Gout, arthropathy  No changes in current management, will follow labs done today and will give further recommendations accordingly. Letter will be  provided.  -     Uric acid -     Febuxostat (ULORIC) 80 MG TABS; Take 1 tablet (80 mg total) by mouth daily.  Diabetes mellitus screening -     Hemoglobin A1c  Prostate cancer screening -     PSA(Must document that pt has been informed of limitations of PSA testing.)  Colon cancer screening -     Ambulatory referral to Gastroenterology    Return in 6 months (on 02/02/2017).    Dionna Wiedemann G. [place], MD  [facility]. [place] office.

## 2016-08-03 ENCOUNTER — Encounter: Payer: Self-pay | Admitting: Family Medicine

## 2016-08-03 ENCOUNTER — Encounter: Payer: Self-pay | Admitting: Internal Medicine

## 2016-08-03 ENCOUNTER — Ambulatory Visit (INDEPENDENT_AMBULATORY_CARE_PROVIDER_SITE_OTHER): Payer: BLUE CROSS/BLUE SHIELD | Admitting: Family Medicine

## 2016-08-03 VITALS — BP 128/80 | HR 74 | Resp 12 | Ht 72.0 in | Wt 398.4 lb

## 2016-08-03 DIAGNOSIS — E785 Hyperlipidemia, unspecified: Secondary | ICD-10-CM

## 2016-08-03 DIAGNOSIS — Z1211 Encounter for screening for malignant neoplasm of colon: Secondary | ICD-10-CM

## 2016-08-03 DIAGNOSIS — Z0001 Encounter for general adult medical examination with abnormal findings: Secondary | ICD-10-CM | POA: Diagnosis not present

## 2016-08-03 DIAGNOSIS — Z125 Encounter for screening for malignant neoplasm of prostate: Secondary | ICD-10-CM | POA: Diagnosis not present

## 2016-08-03 DIAGNOSIS — Z131 Encounter for screening for diabetes mellitus: Secondary | ICD-10-CM

## 2016-08-03 DIAGNOSIS — I10 Essential (primary) hypertension: Secondary | ICD-10-CM | POA: Diagnosis not present

## 2016-08-03 DIAGNOSIS — M109 Gout, unspecified: Secondary | ICD-10-CM | POA: Diagnosis not present

## 2016-08-03 DIAGNOSIS — I872 Venous insufficiency (chronic) (peripheral): Secondary | ICD-10-CM

## 2016-08-03 LAB — LIPID PANEL
Cholesterol: 185 mg/dL (ref 0–200)
HDL: 45.8 mg/dL (ref >39.00)
LDL Cholesterol: 123 mg/dL — ABNORMAL HIGH (ref 0–99)
NonHDL: 138.71
Total CHOL/HDL Ratio: 4
Triglycerides: 77 mg/dL (ref 0.0–149.0)
VLDL: 15.4 mg/dL (ref 0.0–40.0)

## 2016-08-03 LAB — COMPREHENSIVE METABOLIC PANEL
ALT: 19 U/L (ref 0–53)
AST: 29 U/L (ref 0–37)
Albumin: 4.1 g/dL (ref 3.5–5.2)
Alkaline Phosphatase: 58 U/L (ref 39–117)
BUN: 11 mg/dL (ref 6–23)
CO2: 26 mEq/L (ref 19–32)
Calcium: 9.4 mg/dL (ref 8.4–10.5)
Chloride: 103 mEq/L (ref 96–112)
Creatinine, Ser: 0.75 mg/dL (ref 0.40–1.50)
GFR: 116.57 mL/min (ref >60.00)
Glucose, Bld: 116 mg/dL — ABNORMAL HIGH (ref 70–99)
Potassium: 4.3 mEq/L (ref 3.5–5.1)
Sodium: 137 mEq/L (ref 135–145)
Total Bilirubin: 1.1 mg/dL (ref 0.2–1.2)
Total Protein: 7.2 g/dL (ref 6.0–8.3)

## 2016-08-03 LAB — PSA: PSA: 2.68 ng/mL (ref 0.10–4.00)

## 2016-08-03 LAB — HEMOGLOBIN A1C: Hgb A1c MFr Bld: 6.4 % (ref 4.6–6.5)

## 2016-08-03 LAB — TSH: TSH: 2.29 u[IU]/mL (ref 0.35–4.50)

## 2016-08-03 LAB — URIC ACID: Uric Acid, Serum: 3.1 mg/dL — ABNORMAL LOW (ref 4.0–7.8)

## 2016-08-03 MED ORDER — SILVER SULFADIAZINE 1 % EX CREA: 1.0000 "application " | TOPICAL_CREAM | Freq: Every day | CUTANEOUS | 1 refills | Status: DC

## 2016-08-03 MED ORDER — FEBUXOSTAT 80 MG PO TABS: 80.0000 mg | ORAL_TABLET | Freq: Every day | ORAL | 3 refills | Status: DC

## 2016-08-03 MED ORDER — LOSARTAN POTASSIUM 100 MG PO TABS: 100.0000 mg | ORAL_TABLET | Freq: Every day | ORAL | 2 refills | Status: DC

## 2016-08-03 NOTE — Patient Instructions (Addendum)
A few things to remember from today's visit:   Essential hypertension, benign - Plan: Comprehensive metabolic panel, TSH  Hyperlipidemia, unspecified hyperlipidemia type - Plan: Comprehensive metabolic panel, Lipid panel  Venous stasis dermatitis of both lower extremities  Gout, arthropathy - Plan: Uric acid  Diabetes mellitus screening - Plan: Hemoglobin A1c  Prostate cancer screening - Plan: PSA(Must document that pt has been informed of limitations of PSA testing.)  Colon cancer screening - Plan: Ambulatory referral to Gastroenterology  Encounter for general adult medical examination with abnormal findings    At least 150 minutes of moderate exercise per week, daily brisk walking for 15-30 min is a good exercise option. Healthy diet low in saturated (animal) fats and sweets and consisting of fresh fruits and vegetables, lean meats such as fish and white chicken and whole grains.  - Vaccines:  Tdap vaccine every 10 years.  Shingles vaccine recommended at age 51, could be given after 51 years of age but not sure about insurance coverage.  Pneumonia vaccines:  Prevnar 13 at 65 and Pneumovax at 266.   -Screening recommendations for low/normal risk males:  Screening for diabetes at age 51-45 and every 3 years.    Lipid screening at 35 and every 3 years.   Colonoscopy for colon cancer screening at age 51 and until age 51.  Prostate cancer screening: some controversy.         Please be sure medication list is accurate. If a new problem present, please set up appointment sooner than planned today.

## 2016-08-06 ENCOUNTER — Encounter: Payer: Self-pay | Admitting: Family Medicine

## 2016-09-14 ENCOUNTER — Other Ambulatory Visit: Payer: Self-pay

## 2016-09-14 ENCOUNTER — Telehealth: Payer: Self-pay | Admitting: *Deleted

## 2016-09-14 DIAGNOSIS — Z1211 Encounter for screening for malignant neoplasm of colon: Secondary | ICD-10-CM

## 2016-09-14 NOTE — Telephone Encounter (Signed)
Dr Leone PayorGessner: Pt is scheduled for direct screening colonoscopy and PV 8/14. I see during chart review that pt has BMI 54.2 and wt 398 lb.  Medical history include sleep apnea.  Is pt okay for direct hospital procedure or does he need OV with you first? Thanks, Olegario MessierKathy in Pv

## 2016-09-14 NOTE — Telephone Encounter (Signed)
Direct hospital ok 

## 2016-09-14 NOTE — Telephone Encounter (Signed)
[facility] at 11;45.  Called pt to let him know of change in appointment.  No answer.  Will try later

## 2016-09-15 NOTE — Telephone Encounter (Signed)
Spoke with pt and notified of [facility] and his PV is scheduled for 08/14 with the nurse.  Pt will call back if he has further questions.

## 2016-10-05 ENCOUNTER — Telehealth: Payer: Self-pay | Admitting: Internal Medicine

## 2016-10-05 NOTE — Telephone Encounter (Signed)
Patient wants to cancel procedure at [facility] for 9.17.18 and does not want to reschedule until first of the year.

## 2016-10-06 NOTE — Telephone Encounter (Signed)
Procedure cancelled with the hospital

## 2016-10-11 ENCOUNTER — Encounter: Payer: BLUE CROSS/BLUE SHIELD | Admitting: Internal Medicine

## 2016-10-21 ENCOUNTER — Ambulatory Visit (HOSPITAL_COMMUNITY): Admit: 2016-10-21 | Payer: BLUE CROSS/BLUE SHIELD | Admitting: Internal Medicine

## 2016-10-21 ENCOUNTER — Encounter (HOSPITAL_COMMUNITY): Payer: Self-pay

## 2016-10-21 SURGERY — COLONOSCOPY WITH PROPOFOL
Anesthesia: Monitor Anesthesia Care

## 2016-11-03 ENCOUNTER — Encounter: Payer: Self-pay | Admitting: Family Medicine

## 2017-02-02 NOTE — Progress Notes (Signed)
[name] is a 51 y.o.male, who is here today to follow on HTN.  Currently he is on Cozaar 100 mg daily. He is taking medications as instructed, no side effects reported. Home BP's: 120's-130/80's. He has not noted unusual headache, visual changes, exertional chest pain, dyspnea,  focal weakness, or edema.  Last eye exam has been "a while."  Lab Results  Component Value Date   CREATININE 0.75 08/03/2016   BUN 11 08/03/2016   NA 137 08/03/2016   K 4.3 08/03/2016   CL 103 08/03/2016   CO2 26 08/03/2016    Pre-diabetes:  Lab Results  Component Value Date   HGBA1C 6.4 08/03/2016   Obesity:  Dietary changes since last OV: He is trying not to eat at night. Exercise: Not regularly.  He tells me that his wife is concerned about LE edema. Hx of stasis dermatitis. Problem is worse during warm weather and with prolonged standing/sitting. Alleviated with LE elevation, better in the morning. Intermittent skin erythema with exudate. He wears compression stockings.   A few days ago he "bumped" RLE against something and has a superficial excoriation. He denies worsening pain, local heat,or erythema. He has not used OTC medication.   Review of Systems  Constitutional: Negative for activity change, appetite change, fatigue, fever and unexpected weight change.  HENT: Negative for nosebleeds, sore throat and trouble swallowing.   Eyes: Negative for redness and visual disturbance.  Respiratory: Negative for apnea, cough, shortness of breath and wheezing.   Cardiovascular: Positive for leg swelling. Negative for chest pain and palpitations.  Gastrointestinal: Negative for abdominal pain, nausea and vomiting.  Endocrine: Negative for polydipsia, polyphagia and polyuria.  Genitourinary: Negative for decreased urine volume, dysuria and hematuria.  Skin: Positive for rash and wound.  Neurological: Negative for dizziness, syncope, weakness and headaches.    Psychiatric/Behavioral: Negative for confusion.     Current Outpatient Medications on File Prior to Visit  Medication Sig Dispense Refill  . aspirin 81 MG tablet Take 81 mg by mouth daily.    . Febuxostat (ULORIC) 80 MG TABS Take 1 tablet (80 mg total) by mouth daily. 90 tablet 3  . losartan (COZAAR) 100 MG tablet Take 1 tablet (100 mg total) by mouth daily. 90 tablet 2  . silver sulfADIAZINE (SILVADENE) 1 % cream Apply 1 application topically daily. 50 g 1   No current facility-administered medications on file prior to visit.      Past Medical History:  Diagnosis Date  . Arthritis    Lower back pain  . Gout   . Hypertension   . Obesity     No Known Allergies  Social History   Socioeconomic History  . Marital status: Married    Spouse name: None  . Number of children: None  . Years of education: None  . Highest education level: None  Social Needs  . Financial resource strain: None  . Food insecurity - worry: None  . Food insecurity - inability: None  . Transportation needs - medical: None  . Transportation needs - non-medical: None  Occupational History  . None  Tobacco Use  . Smoking status: Never Smoker  . Smokeless tobacco: Never Used  Substance and Sexual Activity  . Alcohol use: No  . Drug use: No  . Sexual activity: Yes  Other Topics Concern  . None  Social History Narrative  . None    Vitals:   02/03/17 0744  BP: 125/82  Pulse: 67  Resp:  12  Temp: 98.3 F (36.8 C)  SpO2: 95%   Body mass index is 53.32 kg/m.  Wt Readings from Last 3 Encounters:  02/03/17 (!) 393 lb 2 oz (178.3 kg)  08/03/16 (!) 398 lb 7 oz (180.7 kg)  09/13/15 (!) 385 lb (174.6 kg)     Physical Exam  Nursing note and vitals reviewed. Constitutional: He is oriented to person, place, and time. He appears well-developed. No distress.  HENT:  Head: Normocephalic and atraumatic.  Mouth/Throat: Oropharynx is clear and moist and mucous membranes are normal.  Eyes:  Conjunctivae are normal. Pupils are equal, round, and reactive to light.  Cardiovascular: Normal rate and regular rhythm.  No murmur heard. Pulses:      Dorsalis pedis pulses are 2+ on the right side, and 2+ on the left side.  Respiratory: Effort normal and breath sounds normal. No respiratory distress.  GI: Soft. He exhibits no mass. There is no hepatomegaly. There is no tenderness.  Musculoskeletal: He exhibits edema (2+ pitting LE edema LE,bilateral.). He exhibits no tenderness.  Lymphadenopathy:    He has no cervical adenopathy.  Neurological: He is alert and oriented to person, place, and time. He has normal strength. Gait normal.  Skin: Skin is warm. Abrasion noted. No ecchymosis noted. Rash is not vesicular. No erythema.  LE post inflammatory changes,pretibial bilateral. Crusty macular lesions scattered on pretibial rea,bilateral.  No local heat,drainage,or erythema.     Psychiatric: He has a normal mood and affect. Cognition and memory are normal.  Well groomed, good eye contact.    ASSESSMENT AND PLAN:   [name] was seen today for follow-up.  Diagnoses and all orders for this visit:  Lab Results  Component Value Date   HGBA1C 6.4 02/03/2017   Lab Results  Component Value Date   CREATININE 0.79 02/03/2017   BUN 11 02/03/2017   NA 134 (L) 02/03/2017   K 4.4 02/03/2017   CL 100 02/03/2017   CO2 26 02/03/2017   Venous stasis dermatitis of both lower extremities  Educated about Dx,prognosis,and treatment options. Vascular evaluation could be arrange but he prefers to hold on it. Skin care discussed, no signs of infection,so I do not think oral abx is needed today. Continue compression stockings. Wt loss may also help. HCTZ added today,which may help to control edema. Instructed about warning signs.  -     hydrochlorothiazide (HYDRODIURIL) 25 MG tablet; Take 1 tablet (25 mg total) by mouth daily.  Essential hypertension, benign  DBP's in the 80's. HCTZ  added today to aim for BP goal of < 130/80. No changes in current management. DASH diet recommended. Eye exam recommended annually. F/U in 6 months, before if needed.  -     hydrochlorothiazide (HYDRODIURIL) 25 MG tablet; Take 1 tablet (25 mg total) by mouth daily. -     Basic metabolic panel  BMI 50.0-59.9, adult (HCC)  He lost about 5 Lbs. We discussed benefits of wt loss as well as adverse effects of obesity. Consistency with healthy diet and physical activity recommended. Daily brisk walking for 15-30 min as tolerated.  Pre-diabetes  Healthy life style for primary prevention recommended. Further recommendations will be given according to lab results.  -     Hemoglobin A1c      -[name] was advised to return sooner than planned today if new concerns arise.     Betty G. SwazilandJordan, MD  [facility]. [place] office.

## 2017-02-03 ENCOUNTER — Encounter: Payer: Self-pay | Admitting: Family Medicine

## 2017-02-03 ENCOUNTER — Ambulatory Visit (INDEPENDENT_AMBULATORY_CARE_PROVIDER_SITE_OTHER): Payer: BLUE CROSS/BLUE SHIELD | Admitting: Family Medicine

## 2017-02-03 VITALS — BP 125/82 | HR 67 | Temp 98.3°F | Resp 12 | Ht 72.0 in | Wt 393.1 lb

## 2017-02-03 DIAGNOSIS — I10 Essential (primary) hypertension: Secondary | ICD-10-CM

## 2017-02-03 DIAGNOSIS — R7303 Prediabetes: Secondary | ICD-10-CM

## 2017-02-03 DIAGNOSIS — E119 Type 2 diabetes mellitus without complications: Secondary | ICD-10-CM | POA: Insufficient documentation

## 2017-02-03 DIAGNOSIS — E1169 Type 2 diabetes mellitus with other specified complication: Secondary | ICD-10-CM | POA: Insufficient documentation

## 2017-02-03 DIAGNOSIS — Z6841 Body Mass Index (BMI) 40.0 and over, adult: Secondary | ICD-10-CM

## 2017-02-03 DIAGNOSIS — I872 Venous insufficiency (chronic) (peripheral): Secondary | ICD-10-CM | POA: Diagnosis not present

## 2017-02-03 LAB — BASIC METABOLIC PANEL
BUN: 11 mg/dL (ref 6–23)
CO2: 26 mEq/L (ref 19–32)
Calcium: 9.1 mg/dL (ref 8.4–10.5)
Chloride: 100 mEq/L (ref 96–112)
Creatinine, Ser: 0.79 mg/dL (ref 0.40–1.50)
GFR: 109.57 mL/min (ref >60.00)
Glucose, Bld: 117 mg/dL — ABNORMAL HIGH (ref 70–99)
Potassium: 4.4 mEq/L (ref 3.5–5.1)
Sodium: 134 mEq/L — ABNORMAL LOW (ref 135–145)

## 2017-02-03 LAB — HEMOGLOBIN A1C: Hgb A1c MFr Bld: 6.4 % (ref 4.6–6.5)

## 2017-02-03 MED ORDER — HYDROCHLOROTHIAZIDE 25 MG PO TABS: 25.0000 mg | ORAL_TABLET | Freq: Every day | ORAL | 2 refills | Status: DC

## 2017-02-03 NOTE — Patient Instructions (Addendum)
A few things to remember from today's visit:   Venous stasis dermatitis of both lower extremities  Essential hypertension, benign - Plan: Basic metabolic panel  BMI 50.0-59.9, adult (HCC)  Pre-diabetes - Plan: Hemoglobin A1c   Chronic Venous Insufficiency Chronic venous insufficiency, also called venous stasis, is a condition that prevents blood from being pumped effectively through the veins in your legs. Blood may no longer be pumped effectively from the legs back to the heart. This condition can range from mild to severe. With proper treatment, you should be able to continue with an active life. What are the causes? Chronic venous insufficiency occurs when the vein walls become stretched, weakened, or damaged, or when valves within the vein are damaged. Some common causes of this include:  High blood pressure inside the veins (venous hypertension).  Increased blood pressure in the leg veins from long periods of sitting or standing.  A blood clot that blocks blood flow in a vein (deep vein thrombosis, DVT).  Inflammation of a vein (phlebitis) that causes a blood clot to form.  Tumors in the pelvis that cause blood to back up.  What increases the risk? The following factors may make you more likely to develop this condition:  Having a family history of this condition.  Obesity.  Pregnancy.  Living without enough physical activity or exercise (sedentary lifestyle).  Smoking.  Having a job that requires long periods of standing or sitting in one place.  Being a certain age. Women in their 7840s and 7350s and men in their 870s are more likely to develop this condition.  What are the signs or symptoms? Symptoms of this condition include:  Veins that are enlarged, bulging, or twisted (varicose veins).  Skin breakdown or ulcers.  Reddened or discolored skin on the front of the leg.  Brown, smooth, tight, and painful skin just above the ankle, usually on the inside of the  leg (lipodermatosclerosis).  Swelling.  How is this diagnosed? This condition may be diagnosed based on:  Your medical history.  A physical exam.  Tests, such as: ? A procedure that creates an image of a blood vessel and nearby organs and provides information about blood flow through the blood vessel (duplex ultrasound). ? A procedure that tests blood flow (plethysmography). ? A procedure to look at the veins using X-ray and dye (venogram).  How is this treated? The goals of treatment are to help you return to an active life and to minimize pain or disability. Treatment depends on the severity of your condition, and it may include:  Wearing compression stockings. These can help relieve symptoms and help prevent your condition from getting worse. However, they do not cure the condition.  Sclerotherapy. This is a procedure involving an injection of a material that "dissolves" damaged veins.  Surgery. This may involve: ? Removing a diseased vein (vein stripping). ? Cutting off blood flow through the vein (laser ablation surgery). ? Repairing a valve.  Follow these instructions at home:  Wear compression stockings as told by your health care provider. These stockings help to prevent blood clots and reduce swelling in your legs.  Take over-the-counter and prescription medicines only as told by your health care provider.  Stay active by exercising, walking, or doing different activities. Ask your health care provider what activities are safe for you and how much exercise you need.  Drink enough fluid to keep your urine clear or pale yellow.  Do not use any products that contain nicotine or  tobacco, such as cigarettes and e-cigarettes. If you need help quitting, ask your health care provider.  Keep all follow-up visits as told by your health care provider. This is important. Contact a health care provider if:  You have redness, swelling, or more pain in the affected area.  You  see a red streak or line that extends up or down from the affected area.  You have skin breakdown or a loss of skin in the affected area, even if the breakdown is small.  You get an injury in the affected area. Get help right away if:  You get an injury and an open wound in the affected area.  You have severe pain that does not get better with medicine.  You have sudden numbness or weakness in the foot or ankle below the affected area, or you have trouble moving your foot or ankle.  You have a fever and you have worse or persistent symptoms.  You have chest pain.  You have shortness of breath. Summary  Chronic venous insufficiency, also called venous stasis, is a condition that prevents blood from being pumped effectively through the veins in your legs.  Chronic venous insufficiency occurs when the vein walls become stretched, weakened, or damaged, or when valves within the vein are damaged.  Treatment for this condition depends on how severe your condition is, and it may involve wearing compression stockings or having a procedure.  Make sure you stay active by exercising, walking, or doing different activities. Ask your health care provider what activities are safe for you and how much exercise you need. This information is not intended to replace advice given to you by your health care provider. Make sure you discuss any questions you have with your health care provider. Document Released: 06/06/2006 Document Revised: 12/21/2015 Document Reviewed: 12/21/2015 Elsevier Interactive Patient Education  2017 [employer]  Please be sure medication list is accurate. If a new problem present, please set up appointment sooner than planned today.

## 2017-02-11 ENCOUNTER — Encounter: Payer: Self-pay | Admitting: Family Medicine

## 2017-02-21 ENCOUNTER — Other Ambulatory Visit: Payer: Self-pay | Admitting: *Deleted

## 2017-02-21 DIAGNOSIS — M109 Gout, unspecified: Secondary | ICD-10-CM

## 2017-02-21 MED ORDER — FEBUXOSTAT 80 MG PO TABS: 80.0000 mg | ORAL_TABLET | Freq: Every day | ORAL | 3 refills | Status: DC

## 2017-02-24 ENCOUNTER — Telehealth: Payer: Self-pay | Admitting: Family Medicine

## 2017-02-24 NOTE — Telephone Encounter (Signed)
Copied from CRM [phone]. Topic: Quick Communication - See Telephone Encounter >> Feb 24, 2017 11:54 AM Eston Mouldavis, Sundeep Cary B wrote: CRM for notification. See Telephone encounter for:  PT states he needs prior authorization for  Febuxostat (ULORIC) 80 MG TABS  PT is out of the med and when he went to pharmacy  they told him his insurance  Had denied it

## 2017-02-27 NOTE — Telephone Encounter (Signed)
Prior auth sent to [email or web address].

## 2017-03-03 NOTE — Telephone Encounter (Signed)
Copied from CRM [id number]. Topic: Quick Communication - See Telephone Encounter >> Mar 03, 2017  2:44 PM Terisa Starraylor, Brittany L wrote: CRM for notification. See Telephone encounter for:   03/03/17.  [employer] needs Febuxostat (ULORIC) prior autho on this.   Call back is [phone]   [phone] [facility] (fax [phone])

## 2017-03-06 NOTE — Telephone Encounter (Signed)
Rx denied under [employer] due to no coverage.  I called the pt and received different insurance info and re-submitted to [email or web address].

## 2017-03-10 NOTE — Telephone Encounter (Signed)
Note in [email or web address] state the Rx was denied-PA Case ID: [id number] and a note was faxed stating other medications could be used also. Message sent and fax given to Dr Elvis CoilJordan's assistant.

## 2017-03-10 NOTE — Telephone Encounter (Signed)
Information given to Dr. SwazilandJordan for review.

## 2017-03-13 NOTE — Telephone Encounter (Signed)
Medication can be changed to Allopurinol 300 mg daily. BJ

## 2017-03-14 MED ORDER — ALLOPURINOL 300 MG PO TABS: 300.0000 mg | ORAL_TABLET | Freq: Every day | ORAL | 1 refills | Status: DC

## 2017-03-14 NOTE — Telephone Encounter (Signed)
Allopurinol sent to pharmacy electronically.

## 2017-06-07 DIAGNOSIS — G4733 Obstructive sleep apnea (adult) (pediatric): Secondary | ICD-10-CM | POA: Diagnosis not present

## 2017-07-28 DIAGNOSIS — G4733 Obstructive sleep apnea (adult) (pediatric): Secondary | ICD-10-CM | POA: Diagnosis not present

## 2017-08-26 ENCOUNTER — Encounter (HOSPITAL_COMMUNITY): Payer: Self-pay | Admitting: Emergency Medicine

## 2017-08-26 ENCOUNTER — Emergency Department (HOSPITAL_COMMUNITY)
Admission: EM | Admit: 2017-08-26 | Discharge: 2017-08-26 | Disposition: A | Discharge status: Home / Self Care | Payer: BLUE CROSS/BLUE SHIELD | Attending: Emergency Medicine | Admitting: Emergency Medicine

## 2017-08-26 ENCOUNTER — Other Ambulatory Visit: Payer: Self-pay

## 2017-08-26 DIAGNOSIS — Y92009 Unspecified place in unspecified non-institutional (private) residence as the place of occurrence of the external cause: Secondary | ICD-10-CM | POA: Insufficient documentation

## 2017-08-26 DIAGNOSIS — Z7982 Long term (current) use of aspirin: Secondary | ICD-10-CM | POA: Insufficient documentation

## 2017-08-26 DIAGNOSIS — Y9389 Activity, other specified: Secondary | ICD-10-CM | POA: Insufficient documentation

## 2017-08-26 DIAGNOSIS — Z79899 Other long term (current) drug therapy: Secondary | ICD-10-CM | POA: Insufficient documentation

## 2017-08-26 DIAGNOSIS — S0101XA Laceration without foreign body of scalp, initial encounter: Secondary | ICD-10-CM | POA: Insufficient documentation

## 2017-08-26 DIAGNOSIS — Z23 Encounter for immunization: Secondary | ICD-10-CM | POA: Insufficient documentation

## 2017-08-26 DIAGNOSIS — I10 Essential (primary) hypertension: Secondary | ICD-10-CM | POA: Insufficient documentation

## 2017-08-26 DIAGNOSIS — W1809XA Striking against other object with subsequent fall, initial encounter: Secondary | ICD-10-CM | POA: Insufficient documentation

## 2017-08-26 DIAGNOSIS — Y999 Unspecified external cause status: Secondary | ICD-10-CM | POA: Insufficient documentation

## 2017-08-26 MED ORDER — TETANUS-DIPHTH-ACELL PERTUSSIS 5-2.5-18.5 LF-MCG/0.5 IM SUSP
0.5000 mL | Freq: Once | INTRAMUSCULAR | Status: AC
  Administered 2017-08-26: 0.5 mL via INTRAMUSCULAR
  Filled 2017-08-26: qty 0.5

## 2017-08-26 MED ORDER — BACITRACIN ZINC 500 UNIT/GM EX OINT
TOPICAL_OINTMENT | Freq: Two times a day (BID) | CUTANEOUS | Status: AC
  Administered 2017-08-26: 1 via TOPICAL
  Filled 2017-08-26: qty 0.9

## 2017-08-26 NOTE — ED Provider Notes (Signed)
[facility] EMERGENCY DEPARTMENT Provider Note   CSN: [id number] Arrival date & time: 08/26/17  1923     History   Chief Complaint Chief Complaint  Patient presents with  . Head Laceration    HPI Earl Donovan is a 52 y.o. male.  52yo male presents with injury from a fall. Patient was using a metal bar to help lift a trailer off the wheel when the pipe slipped, causing him to fall forward from bent over position- striking the top of his head on the trailer, then falling to the ground. No LOC, not on blood thinner, bleeding controlled, ambulatory since the incident without difficulty. No other injuries, complaints or concerns. Last td 6 years ago.      Past Medical History:  Diagnosis Date  . Arthritis    Lower back pain  . Gout   . Hypertension   . Obesity     Patient Active Problem List   Diagnosis Date Noted  . Pre-diabetes 02/03/2017  . Hyperlipidemia 07/31/2015  . Gout, arthropathy 07/31/2015  . BMI 50.0-59.9, adult (HCC) 07/31/2015  . Essential hypertension, benign 07/31/2015  . Venous stasis dermatitis of both lower extremities 07/31/2015  . OSA on CPAP 07/31/2015  . Spinal stenosis of lumbar region 07/23/2012  . Numbness 07/23/2012  . Back pain, chronic 07/23/2012    History reviewed. No pertinent surgical history.      Home Medications    Prior to Admission medications   Medication Sig Start Date End Date Taking? Authorizing Provider  allopurinol (ZYLOPRIM) 300 MG tablet Take 1 tablet (300 mg total) by mouth daily. 03/14/17   [place], Betty G, MD  aspirin 81 MG tablet Take 81 mg by mouth daily.    [provider]  Febuxostat (ULORIC) 80 MG TABS Take 1 tablet (80 mg total) by mouth daily. 02/21/17   [place], Betty G, MD  hydrochlorothiazide (HYDRODIURIL) 25 MG tablet Take 1 tablet (25 mg total) by mouth daily. 02/03/17   [place], Betty G, MD  losartan (COZAAR) 100 MG tablet Take 1 tablet (100 mg total) by mouth daily.  08/03/16   [place], Betty G, MD  silver sulfADIAZINE (SILVADENE) 1 % cream Apply 1 application topically daily. 08/03/16   [place], Betty G, MD    Family History Family History  Problem Relation Age of Onset  . Arthritis Mother   . Cancer Mother   . Cancer Father   . Asthma Maternal Grandmother   . Asthma Maternal Grandfather   . Diabetes Daughter     Social History Social History   Tobacco Use  . Smoking status: Never Smoker  . Smokeless tobacco: Never Used  Substance Use Topics  . Alcohol use: No  . Drug use: No     Allergies   Patient has no known allergies.   Review of Systems Review of Systems  Constitutional: Negative for fever.  Eyes: Negative for visual disturbance.  Gastrointestinal: Negative for nausea and vomiting.  Musculoskeletal: Negative for back pain, neck pain and neck stiffness.  Skin: Positive for wound.  Allergic/Immunologic: Negative for immunocompromised state.  Neurological: Negative for dizziness, syncope, weakness, light-headedness, numbness and headaches.  Hematological: Does not bruise/bleed easily.  Psychiatric/Behavioral: Negative for confusion.  All other systems reviewed and are negative.    Physical Exam Updated Vital Signs BP (!) 188/99 (BP Location: Right Arm)   Pulse 73   Temp 98.4 F (36.9 C) (Oral)   Resp 19   Ht 6' (1.829 m)   [place] Fordyce Marland Kitchen)  174.6 kg (385 lb)   SpO2 97%   BMI 52.22 kg/m   Physical Exam  Constitutional: He is oriented to person, place, and time. He appears well-developed and well-nourished. No distress.  HENT:  Head: Normocephalic and atraumatic.    Eyes: Pupils are equal, round, and reactive to light. EOM are normal.  Neck: Normal range of motion. Neck supple.  Cardiovascular: Intact distal pulses.  Pulmonary/Chest: Effort normal.  Musculoskeletal: Normal range of motion. He exhibits no tenderness or deformity.       Cervical back: He exhibits normal range of motion, no tenderness and no bony tenderness.    Neurological: He is alert and oriented to person, place, and time. Gait normal. GCS eye subscore is 4. GCS verbal subscore is 5. GCS motor subscore is 6.  Skin: Skin is warm and dry. He is not diaphoretic.  Psychiatric: He has a normal mood and affect. His behavior is normal.  Nursing note and vitals reviewed.    ED Treatments / Results  Labs (all labs ordered are listed, but only abnormal results are displayed) Labs Reviewed - No data to display  EKG None  Radiology No results found.  Procedures Procedures (including critical care time)  Medications Ordered in ED Medications  Tdap (BOOSTRIX) injection 0.5 mL (0.5 mLs Intramuscular Given 08/26/17 2048)  bacitracin ointment (1 application Topical Given 08/26/17 2048)     Initial Impression / Assessment and Plan / ED Course  I have reviewed the triage vital signs and the nursing notes.  Pertinent labs & imaging results that were available during my care of the patient were reviewed by me and considered in my medical decision making (see chart for details).  Clinical Course as of Aug 26 2049  Sat Aug 26, 2017  2049 52 yo male with minor scalp laceration after fall forward and striking head on a trailer. No LOC, not on blood thinners, no other injuries, exam unremarkable with exception of a 3cm superficial linear avulsion laceration to the mid frontal scalp. Are will be cleaned and dressed, tdap updated, dc to follow up with PCP as needed.   [LM]    Clinical Course User Index [LM] Jeannie FendMurphy, Kaid Seeberger A, PA-C    Final Clinical Impressions(s) / ED Diagnoses   Final diagnoses:  Laceration of scalp, initial encounter    ED Discharge Orders    None       Alden HippMurphy, Rayen Palen A, PA-C 08/26/17 2051    Gerhard MunchLockwood, Robert, MD 08/26/17 2358

## 2017-08-26 NOTE — Discharge Instructions (Signed)
Wound care, recheck for any concerns, return to ER for worsening symptoms.

## 2017-08-26 NOTE — ED Triage Notes (Signed)
Patient from home, was on a trailer trying to lift an item off of it, patient lost balance and fell head first on the side of the trailer. No LOC, not on blood thinners. Has about a 2in laceration on left side of forehead, bleeding controlled at this time. AOx4.

## 2017-09-08 ENCOUNTER — Other Ambulatory Visit: Payer: Self-pay | Admitting: Family Medicine

## 2017-09-09 ENCOUNTER — Other Ambulatory Visit: Payer: Self-pay | Admitting: Family Medicine

## 2017-09-09 DIAGNOSIS — I10 Essential (primary) hypertension: Secondary | ICD-10-CM

## 2017-10-11 ENCOUNTER — Other Ambulatory Visit: Payer: Self-pay | Admitting: Family Medicine

## 2017-10-11 DIAGNOSIS — I10 Essential (primary) hypertension: Secondary | ICD-10-CM

## 2017-10-24 NOTE — Progress Notes (Signed)
HPI:   Earl Donovan is a 52 y.o. male, who is here today for follow up.  He was last seen in 01/2017.  Hypertension:   Currently on HCTZ 25 mg daily and Cozaar 100 mg daily.  He has not taken med consistently, did not take it for 3 days and took it last night.  BP readings with med 130's/80's. Last eye exam over a year ago. No side effects reported.  He has not noted unusual headache, visual changes, exertional chest pain, dyspnea,  focal weakness, or edema.   Lab Results  Component Value Date   CREATININE 0.79 02/03/2017   BUN 11 02/03/2017   NA 134 (L) 02/03/2017   K 4.4 02/03/2017   CL 100 02/03/2017   CO2 26 02/03/2017     Prediabetes:  He has not been exercising regularly and has not been consistent with a healthy diet.  Lab Results  Component Value Date   HGBA1C 6.4 02/03/2017   Venus stasis dermatitis, edema has not been as bad as prior years. He wears compression stocking intermittently. Skin erythema, no ulcers or exudate. Problem is worse with prolonged standing and walking,usually at the end of the day. Alleviated by elevation.  Gout: He takes Allopurinol 100 mg daily. He has not had a gout flare up in years. Tolerating medication well.    Review of Systems  Constitutional: Negative for activity change, appetite change, fatigue and fever.  HENT: Negative for nosebleeds, sore throat and trouble swallowing.   Eyes: Negative for redness and visual disturbance.  Respiratory: Negative for cough, shortness of breath and wheezing.   Cardiovascular: Positive for leg swelling. Negative for chest pain and palpitations.  Gastrointestinal: Negative for abdominal pain, nausea and vomiting.  Endocrine: Negative for polydipsia, polyphagia and polyuria.  Genitourinary: Negative for decreased urine volume, dysuria and hematuria.  Musculoskeletal: Negative for gait problem and myalgias.  Neurological: Negative for dizziness, syncope, weakness  and headaches.     Current Outpatient Medications on File Prior to Visit  Medication Sig Dispense Refill  . allopurinol (ZYLOPRIM) 300 MG tablet TAKE 1 TABLET BY MOUTH EVERY DAY 90 tablet 1  . aspirin 81 MG tablet Take 81 mg by mouth daily.    . silver sulfADIAZINE (SILVADENE) 1 % cream Apply 1 application topically daily. 50 g 1   No current facility-administered medications on file prior to visit.      Past Medical History:  Diagnosis Date  . Arthritis    Lower back pain  . Gout   . Hypertension   . Obesity    No Known Allergies  Social History   Socioeconomic History  . Marital status: Married    Spouse name: Not on file  . Number of children: Not on file  . Years of education: Not on file  . Highest education level: Not on file  Occupational History  . Not on file  Social Needs  . Financial resource strain: Not on file  . Food insecurity:    Worry: Not on file    Inability: Not on file  . Transportation needs:    Medical: Not on file    Non-medical: Not on file  Tobacco Use  . Smoking status: Never Smoker  . Smokeless tobacco: Never Used  Substance and Sexual Activity  . Alcohol use: No  . Drug use: No  . Sexual activity: Yes  Lifestyle  . Physical activity:    Days per week: Not on file  Minutes per session: Not on file  . Stress: Not on file  Relationships  . Social connections:    Talks on phone: Not on file    Gets together: Not on file    Attends religious service: Not on file    Active member of club or organization: Not on file    Attends meetings of clubs or organizations: Not on file    Relationship status: Not on file  Other Topics Concern  . Not on file  Social History Narrative  . Not on file    Vitals:   10/25/17 0715  BP: 122/80  Pulse: 60  Temp: 98.3 F (36.8 C)  SpO2: 96%   Body mass index is 55.06 kg/m.   Wt Readings from Last 3 Encounters:  10/25/17 (!) 406 lb (184.2 kg)  08/26/17 (!) 385 lb (174.6 kg)    02/03/17 (!) 393 lb 2 oz (178.3 kg)    Physical Exam  Nursing note and vitals reviewed. Constitutional: He is oriented to person, place, and time. He appears well-developed. No distress.  HENT:  Head: Normocephalic and atraumatic.  Mouth/Throat: Oropharynx is clear and moist and mucous membranes are normal.  Eyes: Pupils are equal, round, and reactive to light. Conjunctivae are normal.  Cardiovascular: Normal rate and regular rhythm.  No murmur heard. Pulses:      Posterior tibial pulses are 2+ on the right side, and 2+ on the left side.  various veins LE,bilateral.  Respiratory: Effort normal and breath sounds normal. No respiratory distress.  GI: Soft. He exhibits no mass. There is no hepatomegaly. There is no tenderness.  Musculoskeletal: He exhibits edema (2+ non pitting LE edema, bilateral.).  Lymphadenopathy:    He has no cervical adenopathy.  Neurological: He is alert and oriented to person, place, and time. He has normal strength. No cranial nerve deficit. Gait normal.  Skin: Skin is warm. There is erythema.  Pretibial minimal erythema and hyperpigmentation changes. No tenderness and no local heat. No ulcers.  Psychiatric: He has a normal mood and affect. Cognition and memory are normal.  Well groomed, good eye contact.       ASSESSMENT AND PLAN:   Earl Donovan was seen today for follow-up.  Orders Placed This Encounter  Procedures  . Basic metabolic panel  . Hemoglobin A1c  . Lipid panel  . Cologuard   Lab Results  Component Value Date   CREATININE 0.75 10/25/2017   BUN 10 10/25/2017   NA 135 10/25/2017   K 4.3 10/25/2017   CL 101 10/25/2017   CO2 25 10/25/2017   Lab Results  Component Value Date   HGBA1C 6.6 (H) 10/25/2017   Lab Results  Component Value Date   CHOL 168 10/25/2017   HDL 42.80 10/25/2017   LDLCALC 107 (H) 10/25/2017   TRIG 91.0 10/25/2017   CHOLHDL 4 10/25/2017     1. Essential hypertension, benign  Adequately  BP today. Educated about importance of compliance and risk of complications with poorly controlled HTN.  No changes in current management. DASH diet recommended. Eye exam recommended annually. F/U in 6 months, before if needed.  - Basic metabolic panel - losartan (COZAAR) 100 MG tablet; Take 1 tablet (100 mg total) by mouth daily.  Dispense: 90 tablet; Refill: 2 - hydrochlorothiazide (HYDRODIURIL) 25 MG tablet; Take 1 tablet (25 mg total) by mouth daily.  Dispense: 90 tablet; Refill: 2  2. Pre-diabetes  He has gained some wt, last HgA1C 6.4. Further recommendations will  be given according to lab results.  - Hemoglobin A1c  3. Hyperlipidemia, unspecified hyperlipidemia type  Continue non pharmacologic treatment for now. Further recommendations will be given according to lab results.  - Lipid panel  4. Morbid obesity with BMI of 50.0-59.9, adult [facility])  He is gaining wt steadily,about 13 Lb since his last visit in 01/2017. We discussed benefits of wt loss as well as adverse effects of obesity. Consistency with healthy diet and physical activity recommended. Daily brisk walking for 15-30 min as tolerated.   5. Venous stasis dermatitis of both lower extremities  It has ben stable. Good skin care to prevent ulcers. Resumed HCTZ. Compression stocking consistently and LE elevation.  - hydrochlorothiazide (HYDRODIURIL) 25 MG tablet; Take 1 tablet (25 mg total) by mouth daily.  Dispense: 90 tablet; Refill: 2  6. Colon cancer screening - Cologuard     Jerlyn Pain G. [place], MD  [facility]. [place] office.

## 2017-10-25 ENCOUNTER — Ambulatory Visit (INDEPENDENT_AMBULATORY_CARE_PROVIDER_SITE_OTHER): Payer: Commercial Managed Care - PPO | Admitting: Family Medicine

## 2017-10-25 ENCOUNTER — Encounter: Payer: Self-pay | Admitting: Family Medicine

## 2017-10-25 VITALS — BP 122/80 | HR 60 | Temp 98.3°F | Resp 16 | Ht 72.0 in | Wt >= 6400 oz

## 2017-10-25 DIAGNOSIS — I10 Essential (primary) hypertension: Secondary | ICD-10-CM

## 2017-10-25 DIAGNOSIS — Z6841 Body Mass Index (BMI) 40.0 and over, adult: Secondary | ICD-10-CM

## 2017-10-25 DIAGNOSIS — E785 Hyperlipidemia, unspecified: Secondary | ICD-10-CM | POA: Diagnosis not present

## 2017-10-25 DIAGNOSIS — I872 Venous insufficiency (chronic) (peripheral): Secondary | ICD-10-CM

## 2017-10-25 DIAGNOSIS — Z1211 Encounter for screening for malignant neoplasm of colon: Secondary | ICD-10-CM

## 2017-10-25 DIAGNOSIS — R7303 Prediabetes: Secondary | ICD-10-CM | POA: Diagnosis not present

## 2017-10-25 DIAGNOSIS — E119 Type 2 diabetes mellitus without complications: Secondary | ICD-10-CM

## 2017-10-25 LAB — BASIC METABOLIC PANEL
BUN: 10 mg/dL (ref 6–23)
CO2: 25 mEq/L (ref 19–32)
Calcium: 9 mg/dL (ref 8.4–10.5)
Chloride: 101 mEq/L (ref 96–112)
Creatinine, Ser: 0.75 mg/dL (ref 0.40–1.50)
GFR: 116.01 mL/min (ref >60.00)
Glucose, Bld: 122 mg/dL — ABNORMAL HIGH (ref 70–99)
POTASSIUM: 4.3 meq/L (ref 3.5–5.1)
SODIUM: 135 meq/L (ref 135–145)

## 2017-10-25 LAB — LIPID PANEL
Cholesterol: 168 mg/dL (ref 0–200)
HDL: 42.8 mg/dL (ref >39.00)
LDL Cholesterol: 107 mg/dL — ABNORMAL HIGH (ref 0–99)
NonHDL: 125.09
TRIGLYCERIDES: 91 mg/dL (ref 0.0–149.0)
Total CHOL/HDL Ratio: 4
VLDL: 18.2 mg/dL (ref 0.0–40.0)

## 2017-10-25 LAB — HEMOGLOBIN A1C: HEMOGLOBIN A1C: 6.6 % — AB (ref 4.6–6.5)

## 2017-10-25 MED ORDER — HYDROCHLOROTHIAZIDE 25 MG PO TABS: 25.0000 mg | ORAL_TABLET | Freq: Every day | ORAL | 2 refills | Status: DC

## 2017-10-25 MED ORDER — LOSARTAN POTASSIUM 100 MG PO TABS
100.0000 mg | ORAL_TABLET | Freq: Every day | ORAL | 2 refills | Status: DC
Start: 2017-10-25 — End: 2018-04-30

## 2017-10-25 NOTE — Patient Instructions (Signed)
A few things to remember from today's visit:   Essential hypertension, benign - Plan: Basic metabolic panel  Pre-diabetes - Plan: Hemoglobin A1c  Hyperlipidemia, unspecified hyperlipidemia type - Plan: Lipid panel   Stasis Dermatitis Stasis dermatitis is a long-term (chronic) skin condition that happens when veins can no longer pump blood back to the heart (poor circulation). This condition causes a red or brown scaly rash or sores (ulcers) from the pooling of blood (stasis). This condition usually affects the lower legs. It may affect one leg or both legs. Without treatment, severe stasis dermatitis can lead to other skin conditions and infections. What are the causes? This condition is caused by poor circulation. What increases the risk? This condition is more likely to develop in people who:  Are not very active.  Stand for long periods of time.  Have veins that have become enlarged and twisted (varicose veins).  Have leg veins that are not strong enough to send blood back to the heart (venous insufficiency).  Have had a blood clot.  Have been pregnant many times.  Have had vein surgery.  Are obese.  Have heart or kidney failure.  Are 52 years of age or older.  What are the signs or symptoms? Common early symptoms of this condition include:  Swelling in your ankle or leg. This might get better overnight but be worse again in the day.  Skin that looks thin on your ankle and leg.  Manson Passey marks that develop slowly.  Skin that is easily irritated or cracked.  Red, swollen skin.  An achy or heavy feeling after you walk or stand for long periods of time.  Pain.  Later and more severe symptoms of this condition include:  Skin that looks shiny.  Small, open sores (ulcers). These are often red or purple.  Dry, cracking skin.  Skin that feels hard.  Severe itching.  A change in the shape or color of your lower legs.  Severe pain.  Difficulty  walking.  How is this diagnosed? Your health care provider may suspect this condition from your symptoms and medical history. Your health care provider will also do a physical exam. You may need to see a health care provider who specializes in skin diseases (dermatologist). You may also have tests to confirm the diagnosis, including:  Blood tests.  Imaging studies to check blood flow (Doppler ultrasound).  Allergy tests.  How is this treated? Treatment for this condition may include medicine, such as:  Corticosteroid creams and ointments.  Non-corticosteroid medicines applied to the skin (topical).  Medicine to reduce swelling in the legs (diuretics).  Antibiotics.  Medicine to relieve itching (antihistamines).  You may also have to wear:  Compression stockings or an elastic wrap to improve circulation.  A bandage (dressing).  A wrap that contains zinc and gelatin (Unna boot).  Follow these instructions at home: Skin Care  Moisturize your skin as told by your health care provider. Do not use moisturizers with fragrance. This can irritate your skin.  Apply cool compresses to the affected areas.  Do not scratch your skin.  Do not rub your skin dry after a bath or shower. Gently pat your skin dry.  Do not use scented soaps, detergents, or perfumes. Medicines  Take or use over-the-counter and prescription medicines only as told by your health care provider.  If you were prescribed an antibiotic medicine, take or use it as told by your health care provider. Do not stop taking or using the antibiotic  even if your condition starts to improve. Lifestyle  Do not stand or sit in one position for long periods of time.  Do not cross your legs when you sit.  Raise (elevate) your legs above the level of your heart when you are sitting or lying down.  Walk as told by your health care provider. Walking increases blood flow.  Wear comfortable, loose-fitting clothing.  Circulation in your legs will be worse if you wear tight pants, belts, and waistbands. General instructions  Change and remove any dressing as told by your health care provider, if this applies.  Wear compression stockings as told by your health care provider, if this applies. These stockings help to prevent blood clots and reduce swelling in your legs.  Wear the [employer] as told by your health care provider, if this applies.  Keep all follow-up visits as told by your health care provider. This is important. Contact a health care provider if:  Your condition does not improve with treatment.  Your condition gets worse.  You have signs of infection in the affected area. Watch for: ? Swelling. ? Tenderness. ? Redness. ? Soreness. ? Warmth.  You have a fever. Get help right away if:  You notice red streaks coming from the affected area.  Your bone or joint underneath the affected area becomes painful after the skin has healed.  The affected area turns darker.  You feel a deep pain in your leg or groin.  You are short of breath. This information is not intended to replace advice given to you by your health care provider. Make sure you discuss any questions you have with your health care provider. Document Released: 05/12/2005 Document Revised: 09/29/2015 Document Reviewed: 06/18/2014 Elsevier Interactive Patient Education  2018 [employer].  Please be sure medication list is accurate. If a new problem present, please set up appointment sooner than planned today.

## 2017-10-29 ENCOUNTER — Encounter: Payer: Self-pay | Admitting: Family Medicine

## 2017-10-31 ENCOUNTER — Other Ambulatory Visit: Payer: Self-pay | Admitting: *Deleted

## 2017-10-31 MED ORDER — ATORVASTATIN CALCIUM 10 MG PO TABS: 10.0000 mg | ORAL_TABLET | Freq: Every day | ORAL | 3 refills | Status: DC

## 2017-10-31 MED ORDER — METFORMIN HCL 500 MG PO TABS: 500.0000 mg | ORAL_TABLET | Freq: Two times a day (BID) | ORAL | 3 refills | Status: DC

## 2017-12-11 DIAGNOSIS — G4733 Obstructive sleep apnea (adult) (pediatric): Secondary | ICD-10-CM | POA: Diagnosis not present

## 2017-12-12 DIAGNOSIS — G4733 Obstructive sleep apnea (adult) (pediatric): Secondary | ICD-10-CM | POA: Diagnosis not present

## 2018-01-23 DIAGNOSIS — Z1211 Encounter for screening for malignant neoplasm of colon: Secondary | ICD-10-CM | POA: Diagnosis not present

## 2018-01-23 LAB — COLOGUARD: Cologuard: NEGATIVE

## 2018-02-05 ENCOUNTER — Encounter: Payer: Self-pay | Admitting: Family Medicine

## 2018-03-17 ENCOUNTER — Other Ambulatory Visit: Payer: Self-pay | Admitting: Family Medicine

## 2018-04-25 ENCOUNTER — Ambulatory Visit: Payer: Commercial Managed Care - PPO | Admitting: Family Medicine

## 2018-04-30 ENCOUNTER — Encounter: Payer: Self-pay | Admitting: Family Medicine

## 2018-04-30 ENCOUNTER — Ambulatory Visit (INDEPENDENT_AMBULATORY_CARE_PROVIDER_SITE_OTHER): Payer: Commercial Managed Care - PPO | Admitting: Family Medicine

## 2018-04-30 ENCOUNTER — Other Ambulatory Visit: Payer: Self-pay

## 2018-04-30 VITALS — BP 122/80 | HR 60 | Temp 98.0°F | Resp 12 | Ht 72.0 in | Wt 390.0 lb

## 2018-04-30 DIAGNOSIS — E119 Type 2 diabetes mellitus without complications: Secondary | ICD-10-CM | POA: Diagnosis not present

## 2018-04-30 DIAGNOSIS — I10 Essential (primary) hypertension: Secondary | ICD-10-CM

## 2018-04-30 DIAGNOSIS — E785 Hyperlipidemia, unspecified: Secondary | ICD-10-CM

## 2018-04-30 DIAGNOSIS — M109 Gout, unspecified: Secondary | ICD-10-CM | POA: Diagnosis not present

## 2018-04-30 DIAGNOSIS — I872 Venous insufficiency (chronic) (peripheral): Secondary | ICD-10-CM

## 2018-04-30 LAB — COMPREHENSIVE METABOLIC PANEL
ALK PHOS: 61 U/L (ref 39–117)
ALT: 17 U/L (ref 0–53)
AST: 25 U/L (ref 0–37)
Albumin: 4.2 g/dL (ref 3.5–5.2)
BILIRUBIN TOTAL: 1.7 mg/dL — AB (ref 0.2–1.2)
BUN: 13 mg/dL (ref 6–23)
CALCIUM: 9.7 mg/dL (ref 8.4–10.5)
CO2: 26 mEq/L (ref 19–32)
Chloride: 97 mEq/L (ref 96–112)
Creatinine, Ser: 0.77 mg/dL (ref 0.40–1.50)
GFR: 105.68 mL/min (ref >60.00)
Glucose, Bld: 123 mg/dL — ABNORMAL HIGH (ref 70–99)
POTASSIUM: 4.2 meq/L (ref 3.5–5.1)
Sodium: 134 mEq/L — ABNORMAL LOW (ref 135–145)
TOTAL PROTEIN: 7.5 g/dL (ref 6.0–8.3)

## 2018-04-30 LAB — URIC ACID: Uric Acid, Serum: 4.6 mg/dL (ref 4.0–7.8)

## 2018-04-30 LAB — LIPID PANEL
CHOLESTEROL: 144 mg/dL (ref 0–200)
HDL: 45.3 mg/dL (ref >39.00)
LDL CALC: 83 mg/dL (ref 0–99)
NONHDL: 98.69
Total CHOL/HDL Ratio: 3
Triglycerides: 80 mg/dL (ref 0.0–149.0)
VLDL: 16 mg/dL (ref 0.0–40.0)

## 2018-04-30 LAB — MICROALBUMIN / CREATININE URINE RATIO
Creatinine,U: 96.2 mg/dL
MICROALB/CREAT RATIO: 0.7 mg/g (ref 0.0–30.0)
Microalb, Ur: <0.7 mg/dL (ref 0.0–1.9)

## 2018-04-30 LAB — HEMOGLOBIN A1C: Hgb A1c MFr Bld: 6.4 % (ref 4.6–6.5)

## 2018-04-30 MED ORDER — LOSARTAN POTASSIUM 100 MG PO TABS: 100.0000 mg | ORAL_TABLET | Freq: Every day | ORAL | 2 refills | Status: DC

## 2018-04-30 MED ORDER — ATORVASTATIN CALCIUM 10 MG PO TABS: 10.0000 mg | ORAL_TABLET | Freq: Every day | ORAL | 3 refills | Status: DC

## 2018-04-30 MED ORDER — HYDROCHLOROTHIAZIDE 25 MG PO TABS: 25.0000 mg | ORAL_TABLET | Freq: Every day | ORAL | 2 refills | Status: DC

## 2018-04-30 MED ORDER — ALLOPURINOL 300 MG PO TABS: 300.0000 mg | ORAL_TABLET | Freq: Every day | ORAL | 3 refills | Status: DC

## 2018-04-30 MED ORDER — METFORMIN HCL 500 MG PO TABS: 500.0000 mg | ORAL_TABLET | Freq: Two times a day (BID) | ORAL | 2 refills | Status: DC

## 2018-04-30 NOTE — Assessment & Plan Note (Signed)
Well controlled with Allopurinol. No changes in current management.

## 2018-04-30 NOTE — Assessment & Plan Note (Signed)
HgA1C pending today. No changes in current management, will adjust Metformin if needed. Regular exercise and healthy diet with avoidance of added sugar food intake is an important part of treatment and recommended. Annual eye exam, periodic dental and foot care recommended. F/U in 5-6 months

## 2018-04-30 NOTE — Progress Notes (Signed)
HPI:   [name] is a 52 y.o. male, who is here today for chronic disease management.  He was last seen on 10/25/2017.  Hyperlipidemia: Last visit atorvastatin 10 mg daily was recommended. He is tolerating medication well. He has not been consistent with low-fat diet.  Lab Results  Component Value Date   CHOL 168 10/25/2017   HDL 42.80 10/25/2017   LDLCALC 107 (H) 10/25/2017   TRIG 91.0 10/25/2017   CHOLHDL 4 10/25/2017   DM II: Dx in 10/2017. Currently he is on metformin 500 mg twice daily. He is tolerating medication well. He is not checking BS. Denies abdominal pain, nausea,vomiting, polydipsia,polyuria, or polyphagia.  Negative for feet numbness, tingling, or burning. Lab Results  Component Value Date   HGBA1C 6.6 (H) 10/25/2017   Hypertension: Currently he is on losartan 100 mg daily and HCTZ 25 mg daily. Denies severe/frequent headache, visual changes, chest pain, dyspnea, palpitation, claudication, focal weakness, or worsening LE edema.  Lab Results  Component Value Date   CREATININE 0.75 10/25/2017   BUN 10 10/25/2017   NA 135 10/25/2017   K 4.3 10/25/2017   CL 101 10/25/2017   CO2 25 10/25/2017   Gout: Last visit Uloric was changed to allopurinol because of cost. He has not had an exacerbation since he started allopurinol. He is tolerating medication well.  He lost wt initially, initial 3 months, gained wt back. Started watching his diet for the past week. Walking 2 times per week.   Review of Systems  Constitutional: Negative for activity change, appetite change, fatigue and fever.  HENT: Negative for mouth sores and nosebleeds.   Eyes: Negative for redness and visual disturbance.  Respiratory: Negative for cough, shortness of breath and wheezing.   Cardiovascular: Positive for leg swelling. Negative for chest pain and palpitations.  Gastrointestinal: Negative for abdominal pain, nausea and vomiting.  Endocrine: Negative  for polydipsia, polyphagia and polyuria.  Genitourinary: Negative for decreased urine volume, dysuria and hematuria.  Musculoskeletal: Negative for gait problem and myalgias.  Skin: Negative for rash and wound.  Neurological: Negative for weakness, numbness and headaches.    Current Outpatient Medications on File Prior to Visit  Medication Sig Dispense Refill  . allopurinol (ZYLOPRIM) 300 MG tablet TAKE 1 TABLET BY MOUTH EVERY DAY 90 tablet 1  . aspirin 81 MG tablet Take 81 mg by mouth daily.    Marland Kitchen atorvastatin (LIPITOR) 10 MG tablet Take 1 tablet (10 mg total) by mouth daily. 90 tablet 3  . hydrochlorothiazide (HYDRODIURIL) 25 MG tablet Take 1 tablet (25 mg total) by mouth daily. 90 tablet 2  . losartan (COZAAR) 100 MG tablet Take 1 tablet (100 mg total) by mouth daily. 90 tablet 2  . metFORMIN (GLUCOPHAGE) 500 MG tablet Take 1 tablet (500 mg total) by mouth 2 (two) times daily with a meal. 180 tablet 3  . silver sulfADIAZINE (SILVADENE) 1 % cream Apply 1 application topically daily. 50 g 1   No current facility-administered medications on file prior to visit.      Past Medical History:  Diagnosis Date  . Arthritis    Lower back pain  . Gout   . Hypertension   . Obesity    No Known Allergies  Social History   Socioeconomic History  . Marital status: Married    Spouse name: Not on file  . Number of children: Not on file  . Years of education: Not on file  . Highest education  level: Not on file  Occupational History  . Not on file  Social Needs  . Financial resource strain: Not on file  . Food insecurity:    Worry: Not on file    Inability: Not on file  . Transportation needs:    Medical: Not on file    Non-medical: Not on file  Tobacco Use  . Smoking status: Never Smoker  . Smokeless tobacco: Never Used  Substance and Sexual Activity  . Alcohol use: No  . Drug use: No  . Sexual activity: Yes  Lifestyle  . Physical activity:    Days per week: Not on file     Minutes per session: Not on file  . Stress: Not on file  Relationships  . Social connections:    Talks on phone: Not on file    Gets together: Not on file    Attends religious service: Not on file    Active member of club or organization: Not on file    Attends meetings of clubs or organizations: Not on file    Relationship status: Not on file  Other Topics Concern  . Not on file  Social History Narrative  . Not on file    Vitals:   04/30/18 0735 04/30/18 0800  BP: 122/80   Pulse: (!) 56 60  Resp: 12   Temp: 98 F (36.7 C)   SpO2: 99%    Body mass index is 52.89 kg/m.  Wt Readings from Last 3 Encounters:  04/30/18 (!) 390 lb (176.9 kg)  10/25/17 (!) 406 lb (184.2 kg)  08/26/17 (!) 385 lb (174.6 kg)     Physical Exam  Nursing note and vitals reviewed. Constitutional: He is oriented to person, place, and time. He appears well-developed. No distress.  HENT:  Head: Normocephalic and atraumatic.  Mouth/Throat: Oropharynx is clear and moist and mucous membranes are normal.  Eyes: Pupils are equal, round, and reactive to light. Conjunctivae are normal.  Cardiovascular: Normal rate and regular rhythm.  No murmur heard. Pulses:      Dorsalis pedis pulses are 2+ on the right side and 2+ on the left side.  Respiratory: Effort normal and breath sounds normal. No respiratory distress.  GI: Soft. He exhibits no mass. There is no hepatomegaly. There is no abdominal tenderness.  Musculoskeletal:        General: Edema (Lymphedema/trace pitting LE edema, bilateral) present.  Lymphadenopathy:    He has no cervical adenopathy.  Neurological: He is alert and oriented to person, place, and time. He has normal strength. No cranial nerve deficit. Gait normal.  Skin: Skin is warm. No rash noted. No erythema.  Psychiatric: He has a normal mood and affect. Cognition and memory are normal.  Well groomed, good eye contact.   Diabetic Foot Exam - Simple   Simple Foot Form Diabetic Foot  exam was performed with the following findings:  Yes 04/30/2018  7:29 PM  Visual Inspection See comments:  Yes Sensation Testing Intact to touch and monofilament testing bilaterally:  Yes Pulse Check Posterior Tibialis and Dorsalis pulse intact bilaterally:  Yes Comments Hypertrophic toenails.      ASSESSMENT AND PLAN:  [name] was seen today for chronic disease management.  Orders Placed This Encounter  Procedures  . Microalbumin / creatinine urine ratio  . Lipid panel  . Hemoglobin A1c  . Comprehensive metabolic panel  . Uric acid   Lab Results  Component Value Date   MICROALBUR <0.7 04/30/2018   Lab  Results  Component Value Date   HGBA1C 6.4 04/30/2018   Lab Results  Component Value Date   CHOL 144 04/30/2018   HDL 45.30 04/30/2018   LDLCALC 83 04/30/2018   TRIG 80.0 04/30/2018   CHOLHDL 3 04/30/2018   Lab Results  Component Value Date   ALT 17 04/30/2018   AST 25 04/30/2018   ALKPHOS 61 04/30/2018   BILITOT 1.7 (H) 04/30/2018   Lab Results  Component Value Date   LABURIC 4.6 04/30/2018     Hyperlipidemia No changes in current management, will follow labs done today and will give further recommendations accordingly.   Gout, arthropathy Well controlled with Allopurinol. No changes in current management.   Essential hypertension, benign Adequately controlled. No changes in current management. DASH diet recommended. Eye exam recommended annually. F/U in 6 months, before if needed.   Diabetes mellitus type II, non insulin dependent (HCC) HgA1C pending today. No changes in current management, will adjust Metformin if needed. Regular exercise and healthy diet with avoidance of added sugar food intake is an important part of treatment and recommended. Annual eye exam, periodic dental and foot care recommended. F/U in 5-6 months   Venous stasis dermatitis of both lower extremities Problem is adequately controlled. No changes  in HCTZ 25 mg. Continue compression stockings and/or LE elevation above waist level. Good skin care and avoiding trauma also recommended.     Return in about 6 months (around 10/31/2018) for f/u.       Shauntea Lok G. [place], MD  [facility]. [place] office.

## 2018-04-30 NOTE — Assessment & Plan Note (Signed)
No changes in current management, will follow labs done today and will give further recommendations accordingly.  

## 2018-04-30 NOTE — Assessment & Plan Note (Signed)
Adequately controlled. No changes in current management. DASH diet recommended. Eye exam recommended annually. F/U in 6 months, before if needed.  

## 2018-04-30 NOTE — Patient Instructions (Addendum)
A few things to remember from today's visit:   Diabetes mellitus type II, non insulin dependent (HCC) - Plan: Microalbumin / creatinine urine ratio, Hemoglobin A1c, Comprehensive metabolic panel  Essential hypertension, benign  Hyperlipidemia, unspecified hyperlipidemia type - Plan: Lipid panel, Comprehensive metabolic panel  No changes today.  HgA1C goal < 7.0. Avoid sugar added food:regular soft drinks, energy drinks, and sports drinks. candy. cakes. cookies. pies and cobblers. sweet rolls, pastries, and donuts. fruit drinks, such as fruitades and fruit punch. dairy desserts, such as ice cream  Mediterranean diet has showed benefits for sugar control.  How much and what type of carbohydrate foods are important for managing diabetes. The balance between how much insulin is in your body and the carbohydrate you eat makes a difference in your blood glucose levels.  Fasting blood sugar ideally 130 or less, 2 hours after meals less than 180.   Regular exercise also will help with controlling disease, daily brisk walking as tolerated for 15-30 min definitively will help.   Avoid skipping meals, blood sugar might drop and cause serious problems. Remember checking feet periodically, good dental hygiene, and annual eye exam.      Please be sure medication list is accurate. If a new problem present, please set up appointment sooner than planned today.

## 2018-04-30 NOTE — Assessment & Plan Note (Signed)
Problem is adequately controlled. No changes in HCTZ 25 mg. Continue compression stockings and/or LE elevation above waist level. Good skin care and avoiding trauma also recommended.

## 2018-05-08 ENCOUNTER — Telehealth: Payer: Self-pay | Admitting: *Deleted

## 2018-05-08 NOTE — Telephone Encounter (Signed)
Cozaar 100 mg: medication on backorder, please try something similar

## 2018-05-11 ENCOUNTER — Other Ambulatory Visit: Payer: Self-pay | Admitting: *Deleted

## 2018-05-11 MED ORDER — OLMESARTAN MEDOXOMIL 20 MG PO TABS: 20.0000 mg | ORAL_TABLET | Freq: Every day | ORAL | 0 refills | Status: DC

## 2018-05-11 NOTE — Telephone Encounter (Signed)
Benicar 20 mg daily sent to pharmacy. Patient informed that Rx was sent to the pharmacy and to monitor b/p daily and verbalized understanding.

## 2018-05-11 NOTE — Telephone Encounter (Signed)
Benicar 20 mg OR Avapro 150 mg daily. Monitor BP daily.  Thanks, BJ

## 2018-08-07 ENCOUNTER — Other Ambulatory Visit: Payer: Self-pay | Admitting: Family Medicine

## 2018-11-02 ENCOUNTER — Other Ambulatory Visit: Payer: Self-pay | Admitting: Family Medicine

## 2019-01-31 ENCOUNTER — Other Ambulatory Visit: Payer: Self-pay | Admitting: Family Medicine

## 2019-03-14 ENCOUNTER — Other Ambulatory Visit: Payer: Self-pay | Admitting: Family Medicine

## 2019-05-06 ENCOUNTER — Telehealth (INDEPENDENT_AMBULATORY_CARE_PROVIDER_SITE_OTHER): Payer: 59 | Admitting: Family Medicine

## 2019-05-06 ENCOUNTER — Encounter: Payer: Self-pay | Admitting: Family Medicine

## 2019-05-06 VITALS — BP 122/79 | Ht 72.0 in

## 2019-05-06 DIAGNOSIS — E119 Type 2 diabetes mellitus without complications: Secondary | ICD-10-CM

## 2019-05-06 DIAGNOSIS — M109 Gout, unspecified: Secondary | ICD-10-CM

## 2019-05-06 DIAGNOSIS — I10 Essential (primary) hypertension: Secondary | ICD-10-CM

## 2019-05-06 DIAGNOSIS — I872 Venous insufficiency (chronic) (peripheral): Secondary | ICD-10-CM | POA: Diagnosis not present

## 2019-05-06 DIAGNOSIS — E785 Hyperlipidemia, unspecified: Secondary | ICD-10-CM

## 2019-05-06 DIAGNOSIS — E1169 Type 2 diabetes mellitus with other specified complication: Secondary | ICD-10-CM

## 2019-05-06 MED ORDER — ALLOPURINOL 300 MG PO TABS: 300.0000 mg | ORAL_TABLET | Freq: Every day | ORAL | 2 refills | Status: DC

## 2019-05-06 MED ORDER — METFORMIN HCL 500 MG PO TABS: 500.0000 mg | ORAL_TABLET | Freq: Two times a day (BID) | ORAL | 2 refills | Status: DC

## 2019-05-06 MED ORDER — HYDROCHLOROTHIAZIDE 25 MG PO TABS: 25.0000 mg | ORAL_TABLET | Freq: Every day | ORAL | 0 refills | Status: DC

## 2019-05-06 MED ORDER — OLMESARTAN MEDOXOMIL 20 MG PO TABS: 20.0000 mg | ORAL_TABLET | Freq: Every day | ORAL | 2 refills | Status: DC

## 2019-05-06 MED ORDER — ATORVASTATIN CALCIUM 10 MG PO TABS: 10.0000 mg | ORAL_TABLET | Freq: Every day | ORAL | 3 refills | Status: DC

## 2019-05-06 NOTE — Assessment & Plan Note (Signed)
Problem seems to be stable. Continue HCTZ daily as needed. Compression stockings and LE elevation. Continue appropriate skin care.

## 2019-05-06 NOTE — Assessment & Plan Note (Signed)
Reporting normal A1c. Continue Metformin 500 mg twice daily. Periodic eye exam, at least once per year, and appropriate foot care discussed. He will drop results of labs done recently.

## 2019-05-06 NOTE — Assessment & Plan Note (Signed)
Problem is well controlled. Continue allopurinol 300 mg daily and low purine diet.

## 2019-05-06 NOTE — Assessment & Plan Note (Signed)
Based on reported readings problem seems to be well controlled. Continue Benicar 20 mg daily. He is taking HCTZ as needed for lower extremity edema. Possible complications of elevated BP discussed. Recommend monitoring BP regularly at home. Continue low-salt diet. He will drop results of labs done 3 to 4 weeks ago.

## 2019-05-06 NOTE — Progress Notes (Signed)
Virtual Visit via Video Note   I connected with Earl Donovan on 05/06/19 by a video enabled telemedicine application and verified that I am speaking with the correct person using two identifiers.  Location patient: home Location provider:work office Persons participating in the virtual visit: patient, provider  I discussed the limitations of evaluation and management by telemedicine and the availability of in person appointments. The patient expressed understanding and agreed to proceed.   HPI: Earl Donovan is a 54 yo male being seen today for chronic disease management. Last visit on 04/30/18. No new problems sine his last visit.  DM II:Dx'ed in 10/2017. Denies abdominal pain, nausea,vomiting, polydipsia,polyuria, or polyphagia.  Lab Results  Component Value Date   HGBA1C 6.4 04/30/2018   Gout: Dx'ed about 10 years ago. He has not had a flare up in years. He is on Allopurinol 300 mg daily.  Lab Results  Component Value Date   LABURIC 4.6 04/30/2018   HTN: Dx'ed around age 22. He is on Benicar 20 mg and HCTZ 25 mg daily prn, the latter one mainly for LE edema/lymphedema.  LE edema is worse at the end of the day and alleviated by elevation and when he wears compression stockings.  Negative fore severe/frequent headache, visual changes, chest pain, dyspnea, palpitation, claudication, focal weakness, or worsening edema.  Lab Results  Component Value Date   CREATININE 0.77 04/30/2018   BUN 13 04/30/2018   NA 134 (L) 04/30/2018   K 4.2 04/30/2018   CL 97 04/30/2018   CO2 26 04/30/2018   HLD:On Atorvastatin 10 mg daily. Tolerating medication well.  Lab Results  Component Value Date   CHOL 144 04/30/2018   HDL 45.30 04/30/2018   LDLCALC 83 04/30/2018   TRIG 80.0 04/30/2018   CHOLHDL 3 04/30/2018   States that he had labs done 3-4 weeks ago for life insurance and every thing was fine.  ROS: See pertinent positives and negatives per HPI.  Past Medical History:   Diagnosis Date  . Arthritis    Lower back pain  . Gout   . Hypertension   . Obesity     No past surgical history on file.  Family History  Problem Relation Age of Onset  . Arthritis Mother   . Cancer Mother   . Cancer Father   . Asthma Maternal Grandmother   . Asthma Maternal Grandfather   . Diabetes Daughter     Social History   Socioeconomic History  . Marital status: Married    Spouse name: Not on file  . Number of children: Not on file  . Years of education: Not on file  . Highest education level: Not on file  Occupational History  . Not on file  Tobacco Use  . Smoking status: Never Smoker  . Smokeless tobacco: Never Used  Substance and Sexual Activity  . Alcohol use: No  . Drug use: No  . Sexual activity: Yes  Other Topics Concern  . Not on file  Social History Narrative  . Not on file   Social Determinants of Health   Financial Resource Strain:   . Difficulty of Paying Living Expenses:   Food Insecurity:   . Worried About Programme researcher, broadcasting/film/video in the Last Year:   . Barista in the Last Year:   Transportation Needs:   . Freight forwarder (Medical):   Marland Kitchen Lack of Transportation (Non-Medical):   Physical Activity:   . Days of Exercise per Week:   .  Minutes of Exercise per Session:   Stress:   . Feeling of Stress :   Social Connections:   . Frequency of Communication with Friends and Family:   . Frequency of Social Gatherings with Friends and Family:   . Attends Religious Services:   . Active Member of Clubs or Organizations:   . Attends Archivist Meetings:   Marland Kitchen Marital Status:   Intimate Partner Violence:   . Fear of Current or Ex-Partner:   . Emotionally Abused:   Marland Kitchen Physically Abused:   . Sexually Abused:     Current Outpatient Medications:  .  allopurinol (ZYLOPRIM) 300 MG tablet, Take 1 tablet (300 mg total) by mouth daily., Disp: 90 tablet, Rfl: 2 .  aspirin 81 MG tablet, Take 81 mg by mouth daily., Disp: , Rfl:  .   atorvastatin (LIPITOR) 10 MG tablet, Take 1 tablet (10 mg total) by mouth daily., Disp: 90 tablet, Rfl: 3 .  hydrochlorothiazide (HYDRODIURIL) 25 MG tablet, Take 1 tablet (25 mg total) by mouth daily., Disp: 90 tablet, Rfl: 0 .  metFORMIN (GLUCOPHAGE) 500 MG tablet, Take 1 tablet (500 mg total) by mouth 2 (two) times daily with a meal., Disp: 180 tablet, Rfl: 2 .  olmesartan (BENICAR) 20 MG tablet, Take 1 tablet (20 mg total) by mouth daily., Disp: 90 tablet, Rfl: 2 .  silver sulfADIAZINE (SILVADENE) 1 % cream, Apply 1 application topically daily., Disp: 50 g, Rfl: 1  EXAM:  VITALS per patient if applicable:BP 626/94   Ht 6' (1.829 m)   BMI 52.89 kg/m   GENERAL: alert, oriented, appears well and in no acute distress  HEENT: atraumatic, conjunctiva clear, no obvious abnormalities on inspection.  NECK: normal movements of the head and neck  LUNGS: on inspection no signs of respiratory distress, breathing rate appears normal, no obvious gross SOB, gasping or wheezing  CV: no obvious cyanosis  PSYCH/NEURO: pleasant and cooperative, no obvious depression or anxiety, speech and thought processing grossly intact  ASSESSMENT AND PLAN:  Discussed the following assessment and plan:  Essential hypertension, benign Based on reported readings problem seems to be well controlled. Continue Benicar 20 mg daily. He is taking HCTZ as needed for lower extremity edema. Possible complications of elevated BP discussed. Recommend monitoring BP regularly at home. Continue low-salt diet. He will drop results of labs done 3 to 4 weeks ago.  Diabetes mellitus type II, non insulin dependent ([place]) Reporting normal A1c. Continue Metformin 500 mg twice daily. Periodic eye exam, at least once per year, and appropriate foot care discussed. He will drop results of labs done recently.  Venous stasis dermatitis of both lower extremities Problem seems to be stable. Continue HCTZ daily as  needed. Compression stockings and LE elevation. Continue appropriate skin care.  Gout, arthropathy Problem is well controlled. Continue allopurinol 300 mg daily and low purine diet.    I discussed the assessment and treatment plan with the patient. Earl Donovan was provided an opportunity to ask questions and all were answered. He agreed with the plan and demonstrated an understanding of the instructions.    Return in about 6 months (around 11/06/2019) for HTN,DM II.    Verbena Boeding [place], MD

## 2019-07-19 ENCOUNTER — Emergency Department (HOSPITAL_COMMUNITY): Payer: Commercial Managed Care - PPO

## 2019-07-19 ENCOUNTER — Other Ambulatory Visit: Payer: Self-pay

## 2019-07-19 ENCOUNTER — Inpatient Hospital Stay (HOSPITAL_COMMUNITY)
Admission: EM | Admit: 2019-07-19 | Discharge: 2019-07-22 | DRG: 176 | Disposition: A | Discharge status: Home / Self Care | Payer: Commercial Managed Care - PPO | Attending: Internal Medicine | Admitting: Internal Medicine

## 2019-07-19 ENCOUNTER — Encounter (HOSPITAL_COMMUNITY): Payer: Self-pay | Admitting: Emergency Medicine

## 2019-07-19 DIAGNOSIS — G4733 Obstructive sleep apnea (adult) (pediatric): Secondary | ICD-10-CM

## 2019-07-19 DIAGNOSIS — Z825 Family history of asthma and other chronic lower respiratory diseases: Secondary | ICD-10-CM

## 2019-07-19 DIAGNOSIS — Z9989 Dependence on other enabling machines and devices: Secondary | ICD-10-CM

## 2019-07-19 DIAGNOSIS — I10 Essential (primary) hypertension: Secondary | ICD-10-CM | POA: Diagnosis present

## 2019-07-19 DIAGNOSIS — Z809 Family history of malignant neoplasm, unspecified: Secondary | ICD-10-CM

## 2019-07-19 DIAGNOSIS — Z8261 Family history of arthritis: Secondary | ICD-10-CM

## 2019-07-19 DIAGNOSIS — Z7984 Long term (current) use of oral hypoglycemic drugs: Secondary | ICD-10-CM

## 2019-07-19 DIAGNOSIS — I872 Venous insufficiency (chronic) (peripheral): Secondary | ICD-10-CM | POA: Diagnosis present

## 2019-07-19 DIAGNOSIS — Z20822 Contact with and (suspected) exposure to covid-19: Secondary | ICD-10-CM | POA: Diagnosis present

## 2019-07-19 DIAGNOSIS — Z833 Family history of diabetes mellitus: Secondary | ICD-10-CM

## 2019-07-19 DIAGNOSIS — Z79899 Other long term (current) drug therapy: Secondary | ICD-10-CM

## 2019-07-19 DIAGNOSIS — M1990 Unspecified osteoarthritis, unspecified site: Secondary | ICD-10-CM | POA: Diagnosis present

## 2019-07-19 DIAGNOSIS — M545 Low back pain: Secondary | ICD-10-CM | POA: Diagnosis present

## 2019-07-19 DIAGNOSIS — R0609 Other forms of dyspnea: Secondary | ICD-10-CM

## 2019-07-19 DIAGNOSIS — M109 Gout, unspecified: Secondary | ICD-10-CM | POA: Diagnosis present

## 2019-07-19 DIAGNOSIS — I2699 Other pulmonary embolism without acute cor pulmonale: Secondary | ICD-10-CM | POA: Diagnosis not present

## 2019-07-19 DIAGNOSIS — Z86711 Personal history of pulmonary embolism: Secondary | ICD-10-CM

## 2019-07-19 DIAGNOSIS — E119 Type 2 diabetes mellitus without complications: Secondary | ICD-10-CM

## 2019-07-19 DIAGNOSIS — E785 Hyperlipidemia, unspecified: Secondary | ICD-10-CM | POA: Diagnosis present

## 2019-07-19 DIAGNOSIS — Z7982 Long term (current) use of aspirin: Secondary | ICD-10-CM

## 2019-07-19 DIAGNOSIS — E1169 Type 2 diabetes mellitus with other specified complication: Secondary | ICD-10-CM | POA: Diagnosis present

## 2019-07-19 DIAGNOSIS — Z6841 Body Mass Index (BMI) 40.0 and over, adult: Secondary | ICD-10-CM

## 2019-07-19 DIAGNOSIS — R0602 Shortness of breath: Secondary | ICD-10-CM | POA: Diagnosis not present

## 2019-07-19 HISTORY — DX: Other pulmonary embolism without acute cor pulmonale: I26.99

## 2019-07-19 HISTORY — DX: Morbid (severe) obesity due to excess calories: E66.01

## 2019-07-19 LAB — CBC WITH DIFFERENTIAL/PLATELET
Abs Immature Granulocytes: 0.06 10*3/uL (ref 0.00–0.07)
Basophils Absolute: 0.1 10*3/uL (ref 0.0–0.1)
Basophils Relative: 1 %
Eosinophils Absolute: 0.2 10*3/uL (ref 0.0–0.5)
Eosinophils Relative: 2 %
HCT: 51.1 % (ref 39.0–52.0)
Hemoglobin: 17 g/dL (ref 13.0–17.0)
Immature Granulocytes: 1 %
Lymphocytes Relative: 35 %
Lymphs Abs: 3.2 10*3/uL (ref 0.7–4.0)
MCH: 30 pg (ref 26.0–34.0)
MCHC: 33.3 g/dL (ref 30.0–36.0)
MCV: 90.1 fL (ref 80.0–100.0)
Monocytes Absolute: 0.7 10*3/uL (ref 0.1–1.0)
Monocytes Relative: 8 %
Neutro Abs: 4.9 10*3/uL (ref 1.7–7.7)
Neutrophils Relative %: 53 %
Platelets: 223 10*3/uL (ref 150–400)
RBC: 5.67 MIL/uL (ref 4.22–5.81)
RDW: 13.3 % (ref 11.5–15.5)
WBC: 9 10*3/uL (ref 4.0–10.5)
nRBC: 0 % (ref 0.0–0.2)

## 2019-07-19 LAB — COMPREHENSIVE METABOLIC PANEL
ALT: 18 U/L (ref 0–44)
AST: 32 U/L (ref 15–41)
Albumin: 4 g/dL (ref 3.5–5.0)
Alkaline Phosphatase: 60 U/L (ref 38–126)
Anion gap: 12 (ref 5–15)
BUN: 8 mg/dL (ref 6–20)
CO2: 23 mmol/L (ref 22–32)
Calcium: 9.3 mg/dL (ref 8.9–10.3)
Chloride: 102 mmol/L (ref 98–111)
Creatinine, Ser: 0.93 mg/dL (ref 0.61–1.24)
GFR calc Af Amer: >60 mL/min (ref >60)
GFR calc non Af Amer: >60 mL/min (ref >60)
Glucose, Bld: 106 mg/dL — ABNORMAL HIGH (ref 70–99)
Potassium: 4.3 mmol/L (ref 3.5–5.1)
Sodium: 137 mmol/L (ref 135–145)
Total Bilirubin: 1.2 mg/dL (ref 0.3–1.2)
Total Protein: 8.7 g/dL — ABNORMAL HIGH (ref 6.5–8.1)

## 2019-07-19 NOTE — ED Triage Notes (Signed)
Pt states he has increase SOB for the past 4 days with some fever, denies any exposure to Covid 19 and that he is fully vaccinated. Pt is able to talk on complete sentences no acute distress noticed.

## 2019-07-20 ENCOUNTER — Observation Stay (HOSPITAL_COMMUNITY): Payer: Commercial Managed Care - PPO

## 2019-07-20 ENCOUNTER — Encounter (HOSPITAL_COMMUNITY): Payer: Self-pay | Admitting: Internal Medicine

## 2019-07-20 ENCOUNTER — Emergency Department (HOSPITAL_COMMUNITY): Payer: Commercial Managed Care - PPO

## 2019-07-20 ENCOUNTER — Encounter (HOSPITAL_COMMUNITY): Payer: 59

## 2019-07-20 DIAGNOSIS — Z8261 Family history of arthritis: Secondary | ICD-10-CM | POA: Diagnosis not present

## 2019-07-20 DIAGNOSIS — E785 Hyperlipidemia, unspecified: Secondary | ICD-10-CM | POA: Diagnosis present

## 2019-07-20 DIAGNOSIS — Z20822 Contact with and (suspected) exposure to covid-19: Secondary | ICD-10-CM | POA: Diagnosis present

## 2019-07-20 DIAGNOSIS — Z86711 Personal history of pulmonary embolism: Secondary | ICD-10-CM | POA: Diagnosis not present

## 2019-07-20 DIAGNOSIS — Z809 Family history of malignant neoplasm, unspecified: Secondary | ICD-10-CM | POA: Diagnosis not present

## 2019-07-20 DIAGNOSIS — Z7982 Long term (current) use of aspirin: Secondary | ICD-10-CM | POA: Diagnosis not present

## 2019-07-20 DIAGNOSIS — R0602 Shortness of breath: Secondary | ICD-10-CM

## 2019-07-20 DIAGNOSIS — Z825 Family history of asthma and other chronic lower respiratory diseases: Secondary | ICD-10-CM | POA: Diagnosis not present

## 2019-07-20 DIAGNOSIS — Z7984 Long term (current) use of oral hypoglycemic drugs: Secondary | ICD-10-CM | POA: Diagnosis not present

## 2019-07-20 DIAGNOSIS — I872 Venous insufficiency (chronic) (peripheral): Secondary | ICD-10-CM | POA: Diagnosis present

## 2019-07-20 DIAGNOSIS — M109 Gout, unspecified: Secondary | ICD-10-CM | POA: Diagnosis present

## 2019-07-20 DIAGNOSIS — I2699 Other pulmonary embolism without acute cor pulmonale: Secondary | ICD-10-CM | POA: Diagnosis present

## 2019-07-20 DIAGNOSIS — M1990 Unspecified osteoarthritis, unspecified site: Secondary | ICD-10-CM | POA: Diagnosis present

## 2019-07-20 DIAGNOSIS — I10 Essential (primary) hypertension: Secondary | ICD-10-CM | POA: Diagnosis present

## 2019-07-20 DIAGNOSIS — E1169 Type 2 diabetes mellitus with other specified complication: Secondary | ICD-10-CM | POA: Diagnosis present

## 2019-07-20 DIAGNOSIS — G4733 Obstructive sleep apnea (adult) (pediatric): Secondary | ICD-10-CM | POA: Diagnosis present

## 2019-07-20 DIAGNOSIS — Z6841 Body Mass Index (BMI) 40.0 and over, adult: Secondary | ICD-10-CM | POA: Diagnosis not present

## 2019-07-20 DIAGNOSIS — Z79899 Other long term (current) drug therapy: Secondary | ICD-10-CM | POA: Diagnosis not present

## 2019-07-20 DIAGNOSIS — M545 Low back pain: Secondary | ICD-10-CM | POA: Diagnosis present

## 2019-07-20 DIAGNOSIS — Z833 Family history of diabetes mellitus: Secondary | ICD-10-CM | POA: Diagnosis not present

## 2019-07-20 LAB — ECHOCARDIOGRAM COMPLETE
Height: 72 in
Weight: 6384.52 oz

## 2019-07-20 LAB — SARS CORONAVIRUS 2 BY RT PCR (HOSPITAL ORDER, PERFORMED IN ~~LOC~~ HOSPITAL LAB): SARS Coronavirus 2: NEGATIVE

## 2019-07-20 LAB — HEMOGLOBIN A1C
Hgb A1c MFr Bld: 6.5 % — ABNORMAL HIGH (ref 4.8–5.6)
Mean Plasma Glucose: 139.85 mg/dL

## 2019-07-20 LAB — GLUCOSE, CAPILLARY
Glucose-Capillary: 120 mg/dL — ABNORMAL HIGH (ref 70–99)
Glucose-Capillary: 102 mg/dL — ABNORMAL HIGH (ref 70–99)

## 2019-07-20 LAB — TROPONIN I (HIGH SENSITIVITY)
Troponin I (High Sensitivity): 48 ng/L — ABNORMAL HIGH (ref <18)
Troponin I (High Sensitivity): 52 ng/L — ABNORMAL HIGH (ref <18)

## 2019-07-20 LAB — BRAIN NATRIURETIC PEPTIDE: B Natriuretic Peptide: 45.6 pg/mL (ref 0.0–100.0)

## 2019-07-20 LAB — D-DIMER, QUANTITATIVE: D-Dimer, Quant: 9.34 ug/mL-FEU — ABNORMAL HIGH (ref 0.00–0.50)

## 2019-07-20 LAB — ANTITHROMBIN III: AntiThromb III Func: 78 % (ref 75–120)

## 2019-07-20 LAB — HIV ANTIBODY (ROUTINE TESTING W REFLEX): HIV Screen 4th Generation wRfx: NONREACTIVE

## 2019-07-20 LAB — HEPARIN LEVEL (UNFRACTIONATED): Heparin Unfractionated: 0.59 IU/mL (ref 0.30–0.70)

## 2019-07-20 MED ORDER — ONDANSETRON HCL 4 MG PO TABS: 4.0000 mg | ORAL_TABLET | Freq: Four times a day (QID) | ORAL | Status: DC | PRN

## 2019-07-20 MED ORDER — ACETAMINOPHEN 325 MG PO TABS: 650.0000 mg | ORAL_TABLET | Freq: Four times a day (QID) | ORAL | Status: DC | PRN

## 2019-07-20 MED ORDER — ALLOPURINOL 300 MG PO TABS
300.0000 mg | ORAL_TABLET | Freq: Every day | ORAL | Status: DC
  Administered 2019-07-20 – 2019-07-22 (×3): 300 mg via ORAL
  Filled 2019-07-20 (×4): qty 1

## 2019-07-20 MED ORDER — INSULIN ASPART 100 UNIT/ML ~~LOC~~ SOLN: 0.0000 [IU] | Freq: Every day | SUBCUTANEOUS | Status: DC

## 2019-07-20 MED ORDER — SODIUM CHLORIDE 0.9% FLUSH
3.0000 mL | Freq: Two times a day (BID) | INTRAVENOUS | Status: DC
  Administered 2019-07-20 – 2019-07-21 (×4): 3 mL via INTRAVENOUS

## 2019-07-20 MED ORDER — ACETAMINOPHEN 650 MG RE SUPP: 650.0000 mg | Freq: Four times a day (QID) | RECTAL | Status: DC | PRN

## 2019-07-20 MED ORDER — IOHEXOL 350 MG/ML SOLN
100.0000 mL | Freq: Once | INTRAVENOUS | Status: AC | PRN
  Administered 2019-07-20: 78 mL via INTRAVENOUS

## 2019-07-20 MED ORDER — HYDROCHLOROTHIAZIDE 25 MG PO TABS
25.0000 mg | ORAL_TABLET | Freq: Every day | ORAL | Status: DC
  Administered 2019-07-20 – 2019-07-21 (×2): 25 mg via ORAL
  Filled 2019-07-20 (×2): qty 1

## 2019-07-20 MED ORDER — PERFLUTREN LIPID MICROSPHERE
1.0000 mL | INTRAVENOUS | Status: AC | PRN
  Administered 2019-07-20: 2 mL via INTRAVENOUS
  Filled 2019-07-20: qty 10

## 2019-07-20 MED ORDER — IRBESARTAN 150 MG PO TABS
150.0000 mg | ORAL_TABLET | Freq: Every day | ORAL | Status: DC
  Administered 2019-07-20 – 2019-07-21 (×2): 150 mg via ORAL
  Filled 2019-07-20 (×2): qty 1

## 2019-07-20 MED ORDER — HEPARIN BOLUS VIA INFUSION
7500.0000 [IU] | Freq: Once | INTRAVENOUS | Status: AC
  Administered 2019-07-20: 7500 [IU] via INTRAVENOUS
  Filled 2019-07-20: qty 7500

## 2019-07-20 MED ORDER — MORPHINE SULFATE (PF) 2 MG/ML IV SOLN: 2.0000 mg | INTRAVENOUS | Status: DC | PRN

## 2019-07-20 MED ORDER — HEPARIN (PORCINE) 25000 UT/250ML-% IV SOLN
2200.0000 [IU]/h | INTRAVENOUS | Status: DC
  Administered 2019-07-20 – 2019-07-22 (×5): 2200 [IU]/h via INTRAVENOUS
  Filled 2019-07-20 (×5): qty 250

## 2019-07-20 MED ORDER — TRIAMCINOLONE 0.1 % CREAM:EUCERIN CREAM 1:1
TOPICAL_CREAM | Freq: Two times a day (BID) | CUTANEOUS | Status: DC
  Filled 2019-07-20 (×2): qty 1

## 2019-07-20 MED ORDER — ONDANSETRON HCL 4 MG/2ML IJ SOLN: 4.0000 mg | Freq: Four times a day (QID) | INTRAMUSCULAR | Status: DC | PRN

## 2019-07-20 MED ORDER — ALBUTEROL SULFATE HFA 108 (90 BASE) MCG/ACT IN AERS
2.0000 | INHALATION_SPRAY | Freq: Once | RESPIRATORY_TRACT | Status: AC
  Administered 2019-07-20: 2 via RESPIRATORY_TRACT
  Filled 2019-07-20: qty 6.7

## 2019-07-20 MED ORDER — INSULIN ASPART 100 UNIT/ML ~~LOC~~ SOLN
0.0000 [IU] | Freq: Three times a day (TID) | SUBCUTANEOUS | Status: DC
  Administered 2019-07-21: 2 [IU] via SUBCUTANEOUS
  Administered 2019-07-21: 1 [IU] via SUBCUTANEOUS
  Administered 2019-07-22: 2 [IU] via SUBCUTANEOUS

## 2019-07-20 NOTE — ED Provider Notes (Signed)
[facility] EMERGENCY DEPARTMENT Provider Note   CSN: [id number] Arrival date & time: 07/19/19  2041     History Chief Complaint  Patient presents with  . Shortness of Breath    Earl Donovan is a 54 y.o. male.  54 year old gentleman past medical history of hypertension, obesity, gout presenting to the emergency department for 4 days of shortness of breath.  Patient reports that he has become short of breath with exertion.  He has had any new, dry cough.  He reports that he felt like he had a low-grade fever but did not take his temperature.  He has chronic venous stasis dermatitis but does not report that he has had any increased leg swelling.  He is not a smoker.  Denies any history of asthma or COPD.        Past Medical History:  Diagnosis Date  . Arthritis    Lower back pain  . Gout   . Hypertension   . Obesity     Patient Active Problem List   Diagnosis Date Noted  . Diabetes mellitus type II, non insulin dependent ([place]) 02/03/2017  . Hyperlipidemia associated with type 2 diabetes mellitus ([place]) 07/31/2015  . Gout, arthropathy 07/31/2015  . Morbid obesity with BMI of 50.0-59.9, adult ([place]) 07/31/2015  . Essential hypertension, benign 07/31/2015  . Venous stasis dermatitis of both lower extremities 07/31/2015  . OSA on CPAP 07/31/2015  . Spinal stenosis of lumbar region 07/23/2012  . Numbness 07/23/2012  . Back pain, chronic 07/23/2012    History reviewed. No pertinent surgical history.     Family History  Problem Relation Age of Onset  . Arthritis Mother   . Cancer Mother   . Cancer Father   . Asthma Maternal Grandmother   . Asthma Maternal Grandfather   . Diabetes Daughter     Social History   Tobacco Use  . Smoking status: Never Smoker  . Smokeless tobacco: Never Used  Substance Use Topics  . Alcohol use: No  . Drug use: No    Home Medications Prior to Admission medications   Medication Sig Start Date End Date  Taking? Authorizing Provider  allopurinol (ZYLOPRIM) 300 MG tablet Take 1 tablet (300 mg total) by mouth daily. 05/06/19   [place], Betty G, MD  aspirin 81 MG tablet Take 81 mg by mouth daily.    [provider]  atorvastatin (LIPITOR) 10 MG tablet Take 1 tablet (10 mg total) by mouth daily. 05/06/19   [place], Betty G, MD  hydrochlorothiazide (HYDRODIURIL) 25 MG tablet Take 1 tablet (25 mg total) by mouth daily. 05/06/19   [place], Betty G, MD  metFORMIN (GLUCOPHAGE) 500 MG tablet Take 1 tablet (500 mg total) by mouth 2 (two) times daily with a meal. 05/06/19   [place], Betty G, MD  olmesartan (BENICAR) 20 MG tablet Take 1 tablet (20 mg total) by mouth daily. 05/06/19   [place], Betty G, MD  silver sulfADIAZINE (SILVADENE) 1 % cream Apply 1 application topically daily. 08/03/16   [place], Betty G, MD    Allergies    Patient has no known allergies.  Review of Systems   Review of Systems  Constitutional: Positive for fever. Negative for appetite change.  HENT: Negative for congestion.   Respiratory: Positive for cough and shortness of breath.   Cardiovascular: Positive for leg swelling (chronic). Negative for chest pain.  Gastrointestinal: Negative for abdominal pain, nausea and vomiting.  Genitourinary: Negative for dysuria.  Musculoskeletal: Negative for back  pain.  Skin: Negative for color change.  Neurological: Negative for dizziness and light-headedness.  All other systems reviewed and are negative.   Physical Exam Updated Vital Signs BP (!) 144/78   Pulse 72   Temp 98.6 F (37 C)   Resp 18   Ht 6' (1.829 m)   Wt (!) 181 kg   SpO2 98%   BMI 54.12 kg/m   Physical Exam Vitals and nursing note reviewed.  Constitutional:      General: He is not in acute distress.    Appearance: Normal appearance. He is obese. He is not ill-appearing, toxic-appearing or diaphoretic.  HENT:     Head: Normocephalic.     Mouth/Throat:     Mouth: Mucous membranes are moist.  Eyes:      Conjunctiva/sclera: Conjunctivae normal.     Pupils: Pupils are equal, round, and reactive to light.  Cardiovascular:     Rate and Rhythm: Normal rate and regular rhythm.  Pulmonary:     Effort: Pulmonary effort is normal.     Breath sounds: Normal breath sounds. No decreased breath sounds.  Musculoskeletal:     Right lower leg: Edema present.     Left lower leg: Edema present.  Skin:    General: Skin is warm and dry.  Neurological:     General: No focal deficit present.     Mental Status: He is alert.  Psychiatric:        Mood and Affect: Mood normal.     ED Results / Procedures / Treatments   Labs (all labs ordered are listed, but only abnormal results are displayed) Labs Reviewed  COMPREHENSIVE METABOLIC PANEL - Abnormal; Notable for the following components:      Result Value   Glucose, Bld 106 (*)    Total Protein 8.7 (*)    All other components within normal limits  CBC WITH DIFFERENTIAL/PLATELET  BRAIN NATRIURETIC PEPTIDE  D-DIMER, QUANTITATIVE (NOT AT [facility])    EKG None  Radiology DG Chest 2 View  Result Date: 07/19/2019 CLINICAL DATA:  Shortness of breath. Patient reports shortness of breath with exertion. EXAM: CHEST - 2 VIEW COMPARISON:  None. FINDINGS: The cardiomediastinal contours are normal. Bronchial thickening. Pulmonary vasculature is normal. No consolidation, pleural effusion, or pneumothorax. No acute osseous abnormalities are seen. IMPRESSION: Bronchial thickening can be seen with asthma or bronchitis. Electronically Signed   By: Narda Rutherford M.D.   On: 07/19/2019 21:59    Procedures Procedures (including critical care time)  Medications Ordered in ED Medications - No data to display  ED Course  I have reviewed the triage vital signs and the nursing notes.  Pertinent labs & imaging results that were available during my care of the patient were reviewed by me and considered in my medical decision making (see chart for details).  Clinical  Course as of Jul 20 1454  Sat Jul 20, 2019  0722 Obese male presenting with dyspnea on exertion for 4 days. Well appearing at rest but does appear quite dyspnic with walking in the room. Oxygen sats stable at 94% and above. He has had his covid vaccine but given his sx we will still test him. BNP, CBC, CMP reassuring. Xray showing bronchitic changes. Awaiting ddimer test given his risk factors   [KM]  0911 D-dimer significantly elevated.  Awaiting CTA   [KM]  1020 CTA shows extensive bilateral PE without evidence of R heart strain. Patient remains stable. Possible etiology of PE could include obesity and that  patient has a sedentary job at [employer]. However, we will also order hypercoagulable panel and admit patient   [KM]  1054 Dr. Jonah Blue to admit.    [KM]    Clinical Course User Index [KM] Jeral Pinch   MDM Rules/Calculators/A&P                     CRITICAL CARE Performed by: Arlyn Dunning   Total critical care time: 35 minutes  Critical care time was exclusive of separately billable procedures and treating other patients.  Critical care was necessary to treat or prevent imminent or life-threatening deterioration.  Critical care was time spent personally by me on the following activities: development of treatment plan with patient and/or surrogate as well as nursing, discussions with consultants, evaluation of patient's response to treatment, examination of patient, obtaining history from patient or surrogate, ordering and performing treatments and interventions, ordering and review of laboratory studies, ordering and review of radiographic studies, pulse oximetry and re-evaluation of patient's condition.  Final Clinical Impression(s) / ED Diagnoses Final diagnoses:  None    Rx / DC Orders ED Discharge Orders    None       Jeral Pinch 07/20/19 1456    Benjiman Core, MD 07/20/19 [phone]

## 2019-07-20 NOTE — Care Management (Signed)
Consulted to get eligibility for the best priced  DOAG for patient based on insurance. [employer], they were not in, but received eligibility and benefits fax  Could not acertain drug benefits or his co-pays based on this information. Sent message to obtain benefits on Monday. Circled back with Dr. Ophelia Charter  So she is aware

## 2019-07-20 NOTE — Progress Notes (Signed)
Lower extremity venous has been completed.   Preliminary results in CV Proc.   Blanch Media 07/20/2019 3:25 PM

## 2019-07-20 NOTE — Progress Notes (Signed)
ANTICOAGULATION CONSULT NOTE  Pharmacy Consult for Heparin Indication: pulmonary embolus  No Known Allergies  Patient Measurements: Height: 6' (182.9 cm) Weight: (!) 181 kg (399 lb 0.5 oz) IBW/kg (Calculated) : 77.6 Heparin Dosing Weight: 122.2 kg  Vital Signs: Temp: 98.6 F (37 C) (06/05 0437) BP: 144/78 (06/05 0437) Pulse Rate: 72 (06/05 0437)  Labs: Recent Labs    07/19/19 2136  HGB 17.0  HCT 51.1  PLT 223  CREATININE 0.93    Estimated Creatinine Clearance: 152.8 mL/min (by C-G formula based on SCr of 0.93 mg/dL).   Medical History: Past Medical History:  Diagnosis Date  . Arthritis    Lower back pain  . Gout   . Hypertension   . Obesity     Medications:  Scheduled:  . heparin  7,500 Units Intravenous Once    Assessment: Patient is a 89 yom that presented to the ED with SOB. The patient was found to have bilateral Pes. Pharmacy has been asked to dose heparin at this time for treatment of his PE.   Goal of Therapy:  Heparin level 0.3-0.7 units/ml Monitor platelets by anticoagulation protocol: Yes   Plan:  - Heparin bolus 7500 unit IV x 1 dose - Heparin drip @ 2200 units/hr - Heparin level in ~ 6 hours  - Monitor patient for s/s of bleeding and CBC while on heparin   Joaquim Lai PharmD. BCPS  07/20/2019,10:42 AM

## 2019-07-20 NOTE — Progress Notes (Signed)
ANTICOAGULATION CONSULT NOTE  Pharmacy Consult for Heparin Indication: pulmonary embolus  No Known Allergies  Patient Measurements: Height: 6' (182.9 cm) Weight: (!) 181 kg (399 lb 0.5 oz) IBW/kg (Calculated) : 77.6 Heparin Dosing Weight: 122.2 kg  Vital Signs: Temp: 97.7 F (36.5 C) (06/05 1834) Temp Source: Oral (06/05 1834) BP: 118/62 (06/05 1834) Pulse Rate: 69 (06/05 1834)  Labs: Recent Labs    07/19/19 2136 07/20/19 1035 07/20/19 1216 07/20/19 1805  HGB 17.0  --   --   --   HCT 51.1  --   --   --   PLT 223  --   --   --   HEPARINUNFRC  --   --   --  0.59  CREATININE 0.93  --   --   --   TROPONINIHS  --  48* 52*  --     Estimated Creatinine Clearance: 152.8 mL/min (by C-G formula based on SCr of 0.93 mg/dL).   Medical History: Past Medical History:  Diagnosis Date   Arthritis    Lower back pain   Gout    Hypertension    Morbid obesity with BMI of 50.0-59.9, adult ([facility])    Pulmonary embolism ([facility])     Medications:  Scheduled:   allopurinol  300 mg Oral Daily   hydrochlorothiazide  25 mg Oral Daily   insulin aspart  0-15 Units Subcutaneous TID WC   insulin aspart  0-5 Units Subcutaneous QHS   irbesartan  150 mg Oral Daily   sodium chloride flush  3 mL Intravenous Q12H   triamcinolone 0.1 % cream : eucerin   Topical BID    Assessment: Patient is a 40 yom that presented to the ED with SOB. The patient was found to have bilateral Pes. Pharmacy dosing heparinfor treatment of his PE.   Goal of Therapy:  Heparin level 0.3-0.7 units/ml Monitor platelets by anticoagulation protocol: Yes   Plan:  -Continue heparin at 2200 units/hr -Daily heparin level and CBC  Harland German, PharmD Clinical Pharmacist **Pharmacist phone directory can now be found on [email or web address] ([email or web address]).  Listed under [facility].

## 2019-07-20 NOTE — ED Notes (Signed)
Ambulated pt in room on monitor.  RR increased to 24, Sats dropped to 92% and breathing became labored.  HR increased to 102.

## 2019-07-20 NOTE — H&P (Addendum)
History and Physical    Earl Donovan WPY:[id number] DOB: 09-29-1965 DOA: 07/19/2019  PCP: [place], Betty G, MD Consultants:  Earl Donovan - GI Patient coming from:  Home - lives with wife; NOK: Earl Donovan, [phone]  Chief Complaint: SOB  HPI: Earl Donovan is a 54 y.o. male with medical history significant of morbid obesity (BMI 54); DM; and HTN presenting with SOB.  He reports a fever last week but developed SOB on Wednesday with ambulation.  It is fine when he is at rest. He did strenuous stuff yesterday afternoon and was scared he wouldn't be able to breathe.  Dry cough.  No further fevers.  His left leg might be more swollen than usual but he usually only wears a compression sleeve on the right.  He has B stasis dermatitis but also hit his left lower leg on the corner of stairs and it has been slow to heal.  He is quite sedentary - at work more than at home.  No prior h/o VTE.   ED Course:  4 days of DOE and dry cough.  Hemodynamically stable, not hypoxic even with ambulation.  D-dimer elevated, CTA with B PE.  No apparent right heart strain.  Troponin added on, no chest pain.  Hypercoagulable panel pending.  Likely associated with sedentary lifestyle.  Review of Systems: As per HPI; otherwise review of systems reviewed and negative.   Ambulatory Status:  Ambulates without assistance  COVID Vaccine Status:   Complete  Past Medical History:  Diagnosis Date  . Arthritis    Lower back pain  . Gout   . Hypertension   . Morbid obesity with BMI of 50.0-59.9, adult (HCC)   . Pulmonary embolism (HCC)     History reviewed. No pertinent surgical history.  Social History   Socioeconomic History  . Marital status: Married    Spouse name: Not on file  . Number of children: Not on file  . Years of education: Not on file  . Highest education level: Not on file  Occupational History  . Not on file  Tobacco Use  . Smoking status: Never Smoker  . Smokeless  tobacco: Never Used  Substance and Sexual Activity  . Alcohol use: Yes    Comment: occasional  . Drug use: No  . Sexual activity: Yes  Other Topics Concern  . Not on file  Social History Narrative  . Not on file   Social Determinants of Health   Financial Resource Strain:   . Difficulty of Paying Living Expenses:   Food Insecurity:   . Worried About Programme researcher, broadcasting/film/video in the Last Year:   . Barista in the Last Year:   Transportation Needs:   . Freight forwarder (Medical):   Marland Kitchen Lack of Transportation (Non-Medical):   Physical Activity:   . Days of Exercise per Week:   . Minutes of Exercise per Session:   Stress:   . Feeling of Stress :   Social Connections:   . Frequency of Communication with Friends and Family:   . Frequency of Social Gatherings with Friends and Family:   . Attends Religious Services:   . Active Member of Clubs or Organizations:   . Attends Banker Meetings:   Marland Kitchen Marital Status:   Intimate Partner Violence:   . Fear of Current or Ex-Partner:   . Emotionally Abused:   Marland Kitchen Physically Abused:   . Sexually Abused:     No Known Allergies  Family History  Problem Relation Age of Onset  . Arthritis Mother   . Cancer Mother   . Cancer Father   . Asthma Maternal Grandmother   . Asthma Maternal Grandfather   . Diabetes Daughter   . Clotting disorder Neg Hx     Prior to Admission medications   Medication Sig Start Date End Date Taking? Authorizing Provider  allopurinol (ZYLOPRIM) 300 MG tablet Take 1 tablet (300 mg total) by mouth daily. 05/06/19   [place], Betty G, MD  aspirin 81 MG tablet Take 81 mg by mouth daily.    [provider]  atorvastatin (LIPITOR) 10 MG tablet Take 1 tablet (10 mg total) by mouth daily. 05/06/19   [place], Betty G, MD  hydrochlorothiazide (HYDRODIURIL) 25 MG tablet Take 1 tablet (25 mg total) by mouth daily. 05/06/19   [place], Betty G, MD  metFORMIN (GLUCOPHAGE) 500 MG tablet Take 1 tablet (500 mg  total) by mouth 2 (two) times daily with a meal. 05/06/19   [place], Betty G, MD  olmesartan (BENICAR) 20 MG tablet Take 1 tablet (20 mg total) by mouth daily. 05/06/19   [place], Betty G, MD  silver sulfADIAZINE (SILVADENE) 1 % cream Apply 1 application topically daily. 08/03/16   [place], Betty G, MD    Physical Exam: Vitals:   07/20/19 1145 07/20/19 1200 07/20/19 1215 07/20/19 1230  BP: 122/77 133/72 129/84 112/70  Pulse: (!) 58 66 67 66  Resp: (!) 21 20 20 16   Temp:      TempSrc:      SpO2: 97% 98% 97% 98%  Weight:      Height:         . General:  Appears calm and comfortable and is NAD, on RA . Eyes:  PERRL, EOMI, normal lids, iris . ENT:  grossly normal hearing, lips & tongue, mmm; appropriate dentition . Neck:  no LAD, masses or thyromegaly . Cardiovascular:  RRR, no m/r/Donovan.  . Respiratory:   CTA bilaterally with no wheezes/rales/rhonchi.  Normal respiratory effort. . Abdomen:  soft, NT, ND, NABS, obese . Back:   normal alignment, no CVAT . Skin:  B LE stasis dermatitis without obvious cellulitis; +small hemostatic abrasion on medial L lower leg without surrounding erythema or purulence . Musculoskeletal:  grossly normal tone BUE/BLE, good ROM, no bony abnormality . Psychiatric:  grossly normal mood and affect, speech fluent and appropriate, AOx3 . Neurologic:  CN 2-12 grossly intact, moves all extremities in coordinated fashion, sensation intact    Radiological Exams on Admission: DG Chest 2 View  Result Date: 07/19/2019 CLINICAL DATA:  Shortness of breath. Patient reports shortness of breath with exertion. EXAM: CHEST - 2 VIEW COMPARISON:  None. FINDINGS: The cardiomediastinal contours are normal. Bronchial thickening. Pulmonary vasculature is normal. No consolidation, pleural effusion, or pneumothorax. No acute osseous abnormalities are seen. IMPRESSION: Bronchial thickening can be seen with asthma or bronchitis. Electronically Signed   By: 09/18/2019 M.D.   On:  07/19/2019 21:59   CT Angio Chest PE W and/or Wo Contrast  Result Date: 07/20/2019 CLINICAL DATA:  Short of breath x2 days EXAM: CT ANGIOGRAPHY CHEST WITH CONTRAST TECHNIQUE: Multidetector CT imaging of the chest was performed using the standard protocol during bolus administration of intravenous contrast. Multiplanar CT image reconstructions and MIPs were obtained to evaluate the vascular anatomy. CONTRAST:  1mL OMNIPAQUE IOHEXOL 350 MG/ML SOLN COMPARISON:  None. FINDINGS: Cardiovascular: Heart size normal. No pericardial effusion. RV/LV ratio 0.9. There are multiple bilateral partially occlusive  central and segmental pulmonary emboli, right worse than left. Adequate contrast opacification of the thoracic aorta with no evidence of dissection, aneurysm, or stenosis. There is classic 3-vessel brachiocephalic arch anatomy without proximal stenosis. Mild calcified atheromatous plaque in the arch. Visualized proximal abdominal aorta unremarkable. Mediastinum/Nodes: No mediastinal hematoma. No hilar or mediastinal adenopathy. Lungs/Pleura: No pleural effusion. No pneumothorax. No focal infiltrate, infarct, or nodule. Upper Abdomen: No acute findings Musculoskeletal: Mild superior endplate compression deformity of T10, age indeterminate. Review of the MIP images confirms the above findings. IMPRESSION: 1. Bilateral central and segmental pulmonary emboli, right worse than left. No CT evidence of right heart strain. Critical Value/emergent results were called by telephone at the time of interpretation on 07/20/2019 at 10:20 am to provider [facility] , who verbally acknowledged these results. 2. Mild T10 superior endplate compression deformity, age indeterminate. Aortic Atherosclerosis (ICD10-I70.0). Electronically Signed   By: Lucrezia Europe M.D.   On: 07/20/2019 10:21    EKG: Independently reviewed.  NSR with rate 68; nonspecific ST changes with no evidence of acute ischemia   Labs on Admission: I have personally  reviewed the available labs and imaging studies at the time of the admission.  Pertinent labs:   Glucose 106 BNP 45.6 Normal CBC D-dimer 9.34 Troponin 48   Assessment/Plan Principal Problem:   Bilateral pulmonary embolism (HCC) Active Problems:   Hyperlipidemia associated with type 2 diabetes mellitus (HCC)   Morbid obesity with BMI of 50.0-59.9, adult (HCC)   Essential hypertension, benign   Venous stasis dermatitis of both lower extremities   OSA on CPAP   Diabetes mellitus type II, non insulin dependent (HCC)    PE -Patient without prior episodes of thromboembolic disease presenting with new B PE -He is not currently requiring O2 -Will admit on telemetry -Initiate anticoagulation - for now, will start treatment-dose Heparin drip -He is likely able to transition to alternative [facility] agent tomorrow -DOAC is most convenient option; will request TOC team consult to see which is most affordable for him -Coumadin is another option, since it is very inexpensive; however, it requires frequent physician visits and is much less convenient -He does not appear to have had a hypercoagulable work-up in the past; while this is somewhat academic at this point it was ordered by the EDP; consider repeat of Proteins C and S when not in the acute setting -This clot likely occurred due to his sedentary lifestyle and he has been encouraged to increase his exercise -DVT US ordered; he believes his LLE is more swollen compared to usual -The patient understands that thromboembolic disease can be catastrophic and even deadly and that he must be complaint with physician appointments and anticoagulation.  DM -Will check A1c - prior was 6.4 in 04/2018 -hold Glucophage -Cover with moderate-scale SSI  HTN -Continue HCTZ, Benicar (Avodart formulary substitution)  HLD -He does not appear to be taking medication for this issue at this time  OSA on CPAP -Continue CPAP  Morbid obesity Body mass index  is 54.12 kg/m.  -Weight loss should be encouraged -Outpatient PCP/bariatric medicine/bariatric surgery f/u encouraged  Stasis dermatitis -Continue compression stocking (currently only on the RLE) -Hold silvadene and add Eucerin:TAC for now -No current evidence of cellulitis    Note: This patient has been tested and is negative for the novel coronavirus COVID-19.    DVT prophylaxis: Heparin Code Status:  Full - confirmed with patient Family Communication: None present; I spoke with the patient's wife by telephone at the time of  admission. Disposition Plan:  The patient is from: home  Anticipated d/c is to: home without [facility] services   Anticipated d/c date will depend on clinical response to treatment, but possibly as early as tomorrow if he has excellent response to treatment  Patient is currently: acutely ill Consults called: TOC team Admission status:  Admit - It is my clinical opinion that admission to INPATIENT is reasonable and necessary because of the expectation that this patient will require hospital care that crosses at least 2 midnights to treat this condition based on the medical complexity of the problems presented.  Given the aforementioned information, the predictability of an adverse outcome is felt to be significant.     Jonah Blue MD [facility]   How to contact the [facility] Attending or Consulting provider 7A - 7P or covering provider during after hours 7P -7A, for this patient?  1. Check the care team in [facility] and look for a) attending/consulting TR[facility] provider listed and b) the [facility] team listed 2. Log into [email or web address] and use [employer]'s universal password to access. If you do not have the password, please contact the hospital operator. 3. Locate the [facility] provider you are looking for under [facility] and page to a number that you can be directly reached. 4. If you still have difficulty reaching the provider, please page the [facility] (Director on Call) for  the Hospitalists listed on amion for assistance.   07/20/2019, 1:02 PM

## 2019-07-20 NOTE — Progress Notes (Signed)
  Echocardiogram 2D Echocardiogram has been performed.  Earl Donovan 07/20/2019, 3:45 PM

## 2019-07-21 LAB — CBC
HCT: 46.2 % (ref 39.0–52.0)
Hemoglobin: 15.4 g/dL (ref 13.0–17.0)
MCH: 29.8 pg (ref 26.0–34.0)
MCHC: 33.3 g/dL (ref 30.0–36.0)
MCV: 89.4 fL (ref 80.0–100.0)
Platelets: 200 10*3/uL (ref 150–400)
RBC: 5.17 MIL/uL (ref 4.22–5.81)
RDW: 13.3 % (ref 11.5–15.5)
WBC: 9.3 10*3/uL (ref 4.0–10.5)
nRBC: 0 % (ref 0.0–0.2)

## 2019-07-21 LAB — HEPARIN LEVEL (UNFRACTIONATED): Heparin Unfractionated: 0.6 IU/mL (ref 0.30–0.70)

## 2019-07-21 LAB — BASIC METABOLIC PANEL
Anion gap: 10 (ref 5–15)
BUN: 8 mg/dL (ref 6–20)
CO2: 25 mmol/L (ref 22–32)
Calcium: 8.7 mg/dL — ABNORMAL LOW (ref 8.9–10.3)
Chloride: 99 mmol/L (ref 98–111)
Creatinine, Ser: 0.85 mg/dL (ref 0.61–1.24)
Glucose, Bld: 124 mg/dL — ABNORMAL HIGH (ref 70–99)
Potassium: 3.8 mmol/L (ref 3.5–5.1)
Sodium: 134 mmol/L — ABNORMAL LOW (ref 135–145)

## 2019-07-21 LAB — GLUCOSE, CAPILLARY
Glucose-Capillary: 131 mg/dL — ABNORMAL HIGH (ref 70–99)
Glucose-Capillary: 146 mg/dL — ABNORMAL HIGH (ref 70–99)
Glucose-Capillary: 113 mg/dL — ABNORMAL HIGH (ref 70–99)
Glucose-Capillary: 121 mg/dL — ABNORMAL HIGH (ref 70–99)

## 2019-07-21 LAB — PROTEIN C, TOTAL: Protein C, Total: 85 % (ref 60–150)

## 2019-07-21 MED ORDER — IRBESARTAN 75 MG PO TABS: 75.0000 mg | ORAL_TABLET | Freq: Every day | ORAL | Status: DC

## 2019-07-21 NOTE — Progress Notes (Signed)
Progress Note    Earl Donovan  ZOX:[id number] DOB: Apr 12, 1965  DOA: 07/19/2019 PCP: [place], Betty G, MD    Brief Narrative:     Medical records reviewed and are as summarized below:  Earl Donovan is an 54 y.o. male with medical history significant of morbid obesity (BMI 54); DM; and HTN presenting with SOB.  He reports a fever last week but developed SOB on Wednesday with ambulation.  It is fine when he is at rest. He did strenuous stuff yesterday afternoon and was scared he wouldn't be able to breathe.  Dry cough.  No further fevers.  His left leg might be more swollen than usual but he usually only wears a compression sleeve on the right.  He has B stasis dermatitis but also hit his left lower leg on the corner of stairs and it has been slow to heal.  He is quite sedentary - at work more than at home.  No prior h/o VTE.  Assessment/Plan:   Principal Problem:   Bilateral pulmonary embolism (HCC) Active Problems:   Hyperlipidemia associated with type 2 diabetes mellitus (HCC)   Morbid obesity with BMI of 50.0-59.9, adult (HCC)   Essential hypertension, benign   Venous stasis dermatitis of both lower extremities   OSA on CPAP   Diabetes mellitus type II, non insulin dependent (HCC)   PE -Patient without prior episodes of thromboembolic disease presenting with new B PE -not on O2 but still dyspneic with movement -Initiate anticoagulation - for now, will start treatment-dose Heparin drip -He is likely able to transition to alternative AC agent once insurance can be contacted-- his situation is difficult as his insurance changes on the 18th? -DOAC is most convenient option -echo done: Right ventricular systolic function is moderately reduced-- will need to be followed closely -He does not appear to have had a hypercoagulable work-up in the past; while this is somewhat academic at this point it was ordered by the EDP; consider repeat of Proteins C and S when not in  the acute setting -This clot likely occurred due to his sedentary lifestyle and he has been encouraged to increase his exercise -DVT r/o'd out  DM -Cover with moderate-scale SSI  HTN -BP on lower side -decrease BP meds  HLD -He does not appear to be taking medication for this issue at this time  OSA on CPAP -Continue CPAP  Morbid obesity Estimated body mass index is 54.12 kg/m as calculated from the following:   Height as of this encounter: 6' (1.829 m).   Weight as of this encounter: 181 kg.   Stasis dermatitis -Continue compression stocking (currently only on the RLE) -Hold silvadene and add Eucerin:TAC for now -No current evidence of cellulitis     Family Communication/Anticipated D/C date and plan/Code Status   DVT prophylaxis:  Code Status: Full Code.  Disposition Plan: Status is: Inpatient  Remains inpatient appropriate because:IV treatments appropriate due to intensity of illness or inability to take PO   Dispo: The patient is from: Home              Anticipated d/c is to: Home              Anticipated d/c date is: 1 day              Patient currently is not medically stable to d/c.         Medical Consultants:    None.     Subjective:  Still having dyspnea with exertion  Objective:    Vitals:   07/20/19 1834 07/20/19 2313 07/21/19 0448 07/21/19 1158  BP: 118/62 (!) 141/71 109/60 135/78  Pulse: 69 72 61 75  Resp: 16 17 16 18   Temp: 97.7 F (36.5 C) 97.9 F (36.6 C) 97.9 F (36.6 C) 97.9 F (36.6 C)  TempSrc: Oral Oral Oral   SpO2: 93% 93% 93% 96%  Weight:      Height:        Intake/Output Summary (Last 24 hours) at 07/21/2019 1302 Last data filed at 07/20/2019 1500 Gross per 24 hour  Intake 879.5 ml  Output --  Net 879.5 ml   Filed Weights   07/19/19 2124 07/19/19 2130  Weight: (!) 181 kg (!) 181 kg    Exam:  General: Appearance:    Severely obese male in no acute distress     Lungs:     Clear to auscultation  bilaterally, respirations unlabored  Heart:    Normal heart rate. Normal rhythm. No murmurs, rubs, or gallops.   MS:  + edema and skin changes consistent with venous statsis  Neurologic:   Awake, alert, oriented x 3. No apparent focal neurological           defect.     Data Reviewed:   I have personally reviewed following labs and imaging studies:  Labs: Labs show the following:   Basic Metabolic Panel: Recent Labs  Lab 07/19/19 2136 07/21/19 0446  NA 137 134*  K 4.3 3.8  CL 102 99  CO2 23 25  GLUCOSE 106* 124*  BUN 8 8  CREATININE 0.93 0.85  CALCIUM 9.3 8.7*   GFR Estimated Creatinine Clearance: 167.2 mL/min (by C-G formula based on SCr of 0.85 mg/dL). Liver Function Tests: Recent Labs  Lab 07/19/19 2136  AST 32  ALT 18  ALKPHOS 60  BILITOT 1.2  PROT 8.7*  ALBUMIN 4.0   No results for input(s): LIPASE, AMYLASE in the last 168 hours. No results for input(s): AMMONIA in the last 168 hours. Coagulation profile No results for input(s): INR, PROTIME in the last 168 hours.  CBC: Recent Labs  Lab 07/19/19 2136 07/21/19 0446  WBC 9.0 9.3  NEUTROABS 4.9  --   HGB 17.0 15.4  HCT 51.1 46.2  MCV 90.1 89.4  PLT 223 200   Cardiac Enzymes: No results for input(s): CKTOTAL, CKMB, CKMBINDEX, TROPONINI in the last 168 hours. BNP (last 3 results) No results for input(s): PROBNP in the last 8760 hours. CBG: Recent Labs  Lab 07/20/19 1610 07/20/19 2151 07/21/19 0630 07/21/19 1157  GLUCAP 120* 102* 131* 146*   D-Dimer: Recent Labs    07/20/19 0800  DDIMER 9.34*   Hgb A1c: Recent Labs    07/20/19 1717  HGBA1C 6.5*   Lipid Profile: No results for input(s): CHOL, HDL, LDLCALC, TRIG, CHOLHDL, LDLDIRECT in the last 72 hours. Thyroid function studies: No results for input(s): TSH, T4TOTAL, T3FREE, THYROIDAB in the last 72 hours.  Invalid input(s): FREET3 Anemia work up: No results for input(s): VITAMINB12, FOLATE, FERRITIN, TIBC, IRON, RETICCTPCT in the  last 72 hours. Sepsis Labs: Recent Labs  Lab 07/19/19 2136 07/21/19 0446  WBC 9.0 9.3    Microbiology Recent Results (from the past 240 hour(s))  SARS Coronavirus 2 by RT PCR (hospital order, performed in [facility] hospital lab) Nasopharyngeal Nasopharyngeal Swab     Status: None   Collection Time: 07/20/19  8:00 AM   Specimen: Nasopharyngeal Swab  Result  Value Ref Range Status   SARS Coronavirus 2 NEGATIVE NEGATIVE Final    Comment: (NOTE) SARS-CoV-2 target nucleic acids are NOT DETECTED. The SARS-CoV-2 RNA is generally detectable in upper and lower respiratory specimens during the acute phase of infection. The lowest concentration of SARS-CoV-2 viral copies this assay can detect is 250 copies / mL. A negative result does not preclude SARS-CoV-2 infection and should not be used as the sole basis for treatment or other patient management decisions.  A negative result may occur with improper specimen collection / handling, submission of specimen other than nasopharyngeal swab, presence of viral mutation(s) within the areas targeted by this assay, and inadequate number of viral copies (<250 copies / mL). A negative result must be combined with clinical observations, patient history, and epidemiological information. Fact Sheet for Patients:   BoilerBrush.com.cy Fact Sheet for Healthcare Providers: https://pope.com/ This test is not yet approved or cleared  by the [place] FDA and has been authorized for detection and/or diagnosis of SARS-CoV-2 by FDA under an Emergency Use Authorization (EUA).  This EUA will remain in effect (meaning this test can be used) for the duration of the COVID-19 declaration under Section 564(b)(1) of the Act, 21 U.S.C. section 360bbb-3(b)(1), unless the authorization is terminated or revoked sooner. Performed at [facility], [address], [place], [place] [address]     Procedures and  diagnostic studies:  DG Chest 2 View  Result Date: 07/19/2019 CLINICAL DATA:  Shortness of breath. Patient reports shortness of breath with exertion. EXAM: CHEST - 2 VIEW COMPARISON:  None. FINDINGS: The cardiomediastinal contours are normal. Bronchial thickening. Pulmonary vasculature is normal. No consolidation, pleural effusion, or pneumothorax. No acute osseous abnormalities are seen. IMPRESSION: Bronchial thickening can be seen with asthma or bronchitis. Electronically Signed   By: Narda Rutherford M.D.   On: 07/19/2019 21:59   CT Angio Chest PE W and/or Wo Contrast  Result Date: 07/20/2019 CLINICAL DATA:  Short of breath x2 days EXAM: CT ANGIOGRAPHY CHEST WITH CONTRAST TECHNIQUE: Multidetector CT imaging of the chest was performed using the standard protocol during bolus administration of intravenous contrast. Multiplanar CT image reconstructions and MIPs were obtained to evaluate the vascular anatomy. CONTRAST:  67mL OMNIPAQUE IOHEXOL 350 MG/ML SOLN COMPARISON:  None. FINDINGS: Cardiovascular: Heart size normal. No pericardial effusion. RV/LV ratio 0.9. There are multiple bilateral partially occlusive central and segmental pulmonary emboli, right worse than left. Adequate contrast opacification of the thoracic aorta with no evidence of dissection, aneurysm, or stenosis. There is classic 3-vessel brachiocephalic arch anatomy without proximal stenosis. Mild calcified atheromatous plaque in the arch. Visualized proximal abdominal aorta unremarkable. Mediastinum/Nodes: No mediastinal hematoma. No hilar or mediastinal adenopathy. Lungs/Pleura: No pleural effusion. No pneumothorax. No focal infiltrate, infarct, or nodule. Upper Abdomen: No acute findings Musculoskeletal: Mild superior endplate compression deformity of T10, age indeterminate. Review of the MIP images confirms the above findings. IMPRESSION: 1. Bilateral central and segmental pulmonary emboli, right worse than left. No CT evidence of right  heart strain. Critical Value/emergent results were called by telephone at the time of interpretation on 07/20/2019 at 10:20 am to provider [facility] , who verbally acknowledged these results. 2. Mild T10 superior endplate compression deformity, age indeterminate. Aortic Atherosclerosis (ICD10-I70.0). Electronically Signed   By: Corlis Leak M.D.   On: 07/20/2019 10:21   ECHOCARDIOGRAM COMPLETE  Result Date: 07/20/2019    ECHOCARDIOGRAM REPORT   Patient Name:   DARYUS SOWASH Date of Exam: 07/20/2019 Medical Rec #:  [id number]              Height:       72.0 in Accession #:    [id number]             Weight:       399.0 lb Date of Birth:  10/29/1965              BSA:          2.857 m Patient Age:    54 years               BP:           149/78 mmHg Patient Gender: M                      HR:           75 bpm. Exam Location:  Inpatient Procedure: 2D Echo Indications:    Pulmonary Embolus I26.99  History:        Patient has no prior history of Echocardiogram examinations.                 Risk Factors:Hypertension.  Sonographer:    Thurman Coyerasey Kirkpatrick RDCS (AE) Referring Phys: [id number] JENNIFER YATES IMPRESSIONS  1. Left ventricular ejection fraction, by estimation, is 65 to 70%. The left ventricle has normal function. The left ventricle has no regional wall motion abnormalities. There is mild concentric left ventricular hypertrophy. Left ventricular diastolic parameters were normal.  2. Poor acoustical windows despite definity contrast. Gorss visualization of RV suggests Right ventricular systolic function is moderately reduced. The right ventricular size is moderately enlarged.  3. The mitral valve is normal in structure. No evidence of mitral valve regurgitation. No evidence of mitral stenosis.  4. The aortic valve is normal in structure. Aortic valve regurgitation is not visualized. No aortic stenosis is present.  5. The inferior vena cava is normal in size with greater than 50% respiratory variability, suggesting  right atrial pressure of 3 mmHg. FINDINGS  Left Ventricle: Left ventricular ejection fraction, by estimation, is 65 to 70%. The left ventricle has normal function. The left ventricle has no regional wall motion abnormalities. Definity contrast agent was given IV to delineate the left ventricular  endocardial borders. The left ventricular internal cavity size was normal in size. There is mild concentric left ventricular hypertrophy. Left ventricular diastolic parameters were normal. Normal left ventricular filling pressure. Right Ventricle: The right ventricular size is moderately enlarged. Right vetricular wall thickness was not assessed. Right ventricular systolic function is moderately reduced. Tricuspid regurgitation signal is inadequate for assessing PA pressure. Left Atrium: Left atrial size was not well visualized. Right Atrium: Right atrial size was not well visualized. Pericardium: There is no evidence of pericardial effusion. Mitral Valve: The mitral valve is normal in structure. Normal mobility of the mitral valve leaflets. No evidence of mitral valve regurgitation. No evidence of mitral valve stenosis. Tricuspid Valve: The tricuspid valve is normal in structure. Tricuspid valve regurgitation is not demonstrated. No evidence of tricuspid stenosis. Aortic Valve: The aortic valve is normal in structure. Aortic valve regurgitation is not visualized. No aortic stenosis is present. Pulmonic Valve: The pulmonic valve was normal in structure. Pulmonic valve regurgitation is not visualized. No evidence of pulmonic stenosis. Aorta: The aortic root is normal in size and structure. Venous: The inferior vena cava was not well visualized. The inferior vena cava is normal in size with greater than 50% respiratory variability, suggesting right atrial pressure  of 3 mmHg. IAS/Shunts: No atrial level shunt detected by color flow Doppler.  LEFT VENTRICLE PLAX 2D LVIDd:         4.20 cm  Diastology LVIDs:         2.50 cm  LV  e' lateral:   10.20 cm/s LV PW:         1.30 cm  LV E/e' lateral: 8.1 LV IVS:        1.30 cm  LV e' medial:    8.49 cm/s LVOT diam:     2.60 cm  LV E/e' medial:  9.8 LV SV:         133 LV SV Index:   46 LVOT Area:     5.31 cm  RIGHT VENTRICLE RV S prime:     11.60 cm/s TAPSE (M-mode): 2.1 cm LEFT ATRIUM             Index       RIGHT ATRIUM           Index LA diam:        3.80 cm 1.33 cm/m  RA Area:     19.40 cm LA Vol (A2C):   55.7 ml 19.49 ml/m RA Volume:   43.70 ml  15.29 ml/m LA Vol (A4C):   31.6 ml 11.06 ml/m LA Biplane Vol: 44.8 ml 15.68 ml/m  AORTIC VALVE LVOT Vmax:   130.00 cm/s LVOT Vmean:  88.100 cm/s LVOT VTI:    0.250 m  AORTA Ao Root diam: 2.80 cm MITRAL VALVE MV Area (PHT): 2.45 cm    SHUNTS MV Decel Time: 310 msec    Systemic VTI:  0.25 m MV E velocity: 82.90 cm/s  Systemic Diam: 2.60 cm MV A velocity: 87.10 cm/s MV E/A ratio:  0.95 Armanda Magic MD Electronically signed by Armanda Magic MD Signature Date/Time: 07/20/2019/4:48:40 PM    Final    VAS [place] LOWER EXTREMITY VENOUS (DVT)  Result Date: 07/20/2019  Lower Venous DVTStudy Indications: Pulmonary embolism.  Limitations: Poor ultrasound/tissue interface and body habitus. Comparison Study: no prior Performing Technologist: Blanch Media RVS  Examination Guidelines: A complete evaluation includes B-mode imaging, spectral Doppler, color Doppler, and power Doppler as needed of all accessible portions of each vessel. Bilateral testing is considered an integral part of a complete examination. Limited examinations for reoccurring indications may be performed as noted. The reflux portion of the exam is performed with the patient in reverse Trendelenburg.  +---------+---------------+---------+-----------+----------+--------------+ RIGHT    CompressibilityPhasicitySpontaneityPropertiesThrombus Aging +---------+---------------+---------+-----------+----------+--------------+ CFV      Full           Yes      Yes                                  +---------+---------------+---------+-----------+----------+--------------+ SFJ      Full                                                        +---------+---------------+---------+-----------+----------+--------------+ FV Prox  Full                                                        +---------+---------------+---------+-----------+----------+--------------+  FV Mid   Full                                                        +---------+---------------+---------+-----------+----------+--------------+ FV Distal               Yes      Yes                                 +---------+---------------+---------+-----------+----------+--------------+ PFV      Full                                                        +---------+---------------+---------+-----------+----------+--------------+ POP      Full           Yes      Yes                                 +---------+---------------+---------+-----------+----------+--------------+ PTV      Full                                                        +---------+---------------+---------+-----------+----------+--------------+ PERO                                                  Not visualized +---------+---------------+---------+-----------+----------+--------------+   +---------+---------------+---------+-----------+----------+-----------------+ LEFT     CompressibilityPhasicitySpontaneityPropertiesThrombus Aging    +---------+---------------+---------+-----------+----------+-----------------+ CFV      Full                                                           +---------+---------------+---------+-----------+----------+-----------------+ SFJ      Full                                                           +---------+---------------+---------+-----------+----------+-----------------+ FV Prox  Full                                                            +---------+---------------+---------+-----------+----------+-----------------+ FV Mid                  Yes      Yes                                    +---------+---------------+---------+-----------+----------+-----------------+  FV Distal               Yes      Yes                                    +---------+---------------+---------+-----------+----------+-----------------+ PFV      Full                                                           +---------+---------------+---------+-----------+----------+-----------------+ POP      None           Yes      Yes                  Age Indeterminate +---------+---------------+---------+-----------+----------+-----------------+ PTV                                                   Not visualized    +---------+---------------+---------+-----------+----------+-----------------+ PERO                                                  Not visualized    +---------+---------------+---------+-----------+----------+-----------------+     Summary: RIGHT: - There is no evidence of deep vein thrombosis in the lower extremity.  - No cystic structure found in the popliteal fossa.  LEFT: - Findings consistent with age indeterminate deep vein thrombosis involving the left popliteal vein. - No cystic structure found in the popliteal fossa.  *See table(s) above for measurements and observations.    Preliminary     Medications:   . allopurinol  300 mg Oral Daily  . insulin aspart  0-15 Units Subcutaneous TID WC  . insulin aspart  0-5 Units Subcutaneous QHS  . [START ON 07/22/2019] irbesartan  75 mg Oral Daily  . sodium chloride flush  3 mL Intravenous Q12H  . triamcinolone 0.1 % cream : eucerin   Topical BID   Continuous Infusions: . heparin 2,200 Units/hr (07/21/19 0701)     LOS: 1 day   Joseph Art  [facility]   How to contact the [facility] Attending or Consulting provider 7A - 7P or covering provider during after hours  7P -7A, for this patient?  1. Check the care team in [facility] and look for a) attending/consulting TR[facility] provider listed and b) the [facility] team listed 2. Log into [email or web address] and use [facility]'s universal password to access. If you do not have the password, please contact the hospital operator. 3. Locate the [facility] provider you are looking for under [facility] and page to a number that you can be directly reached. 4. If you still have difficulty reaching the provider, please page the [facility] (Director on Call) for the Hospitalists listed on amion for assistance.  07/21/2019, 1:02 PM

## 2019-07-21 NOTE — Plan of Care (Signed)

## 2019-07-21 NOTE — Progress Notes (Signed)
ANTICOAGULATION CONSULT NOTE  Pharmacy Consult for Heparin Indication: pulmonary embolus  No Known Allergies  Patient Measurements: Height: 6' (182.9 cm) Weight: (!) 181 kg (399 lb 0.5 oz) IBW/kg (Calculated) : 77.6 Heparin Dosing Weight: 122.2 kg  Vital Signs: Temp: 97.9 F (36.6 C) (06/06 0448) Temp Source: Oral (06/06 0448) BP: 109/60 (06/06 0448) Pulse Rate: 61 (06/06 0448)  Labs: Recent Labs    07/19/19 2136 07/20/19 1035 07/20/19 1216 07/20/19 1805 07/21/19 0446  HGB 17.0  --   --   --  15.4  HCT 51.1  --   --   --  46.2  PLT 223  --   --   --  200  HEPARINUNFRC  --   --   --  0.59 0.60  CREATININE 0.93  --   --   --  0.85  TROPONINIHS  --  48* 52*  --   --     Estimated Creatinine Clearance: 167.2 mL/min (by C-G formula based on SCr of 0.85 mg/dL).   Medical History: Past Medical History:  Diagnosis Date  . Arthritis    Lower back pain  . Gout   . Hypertension   . Morbid obesity with BMI of 50.0-59.9, adult (HCC)   . Pulmonary embolism (HCC)     Medications:  Scheduled:  . allopurinol  300 mg Oral Daily  . hydrochlorothiazide  25 mg Oral Daily  . insulin aspart  0-15 Units Subcutaneous TID WC  . insulin aspart  0-5 Units Subcutaneous QHS  . irbesartan  150 mg Oral Daily  . sodium chloride flush  3 mL Intravenous Q12H  . triamcinolone 0.1 % cream : eucerin   Topical BID    Assessment: Patient is a 47 yom that presented to the ED with SOB. The patient was found to have bilateral PEs. Pharmacy has been consulted to dose heparin IV infusion.   Heparin level is therapeutic at 0.6 w/ heparin rate 2200 units/hr. Hemoglobin and platelet count are both down slightly this morning but still WNL. No significant bleeding noted.   Goal of Therapy:  Heparin level 0.3-0.7 units/ml Monitor platelets by anticoagulation protocol: Yes   Plan:  -Continue heparin at 2200 units/hr -Daily heparin level and CBC  Ellison Carwin, PharmD PGY1 Pharmacy  Resident  **Pharmacist phone directory can now be found on [email or web address] (PW [email or web address]).  Listed under [facility].

## 2019-07-22 LAB — BETA-2-GLYCOPROTEIN I ABS, IGG/M/A
Beta-2 Glyco I IgG: <9 GPI IgG units (ref 0–20)
Beta-2-Glycoprotein I IgA: <9 GPI IgA units (ref 0–25)
Beta-2-Glycoprotein I IgM: <9 GPI IgM units (ref 0–32)

## 2019-07-22 LAB — HOMOCYSTEINE: Homocysteine: 12.4 umol/L (ref 0.0–14.5)

## 2019-07-22 LAB — CARDIOLIPIN ANTIBODIES, IGG, IGM, IGA
Anticardiolipin IgA: <9 APL U/mL (ref 0–11)
Anticardiolipin IgG: <9 GPL U/mL (ref 0–14)
Anticardiolipin IgM: 15 MPL U/mL — ABNORMAL HIGH (ref 0–12)

## 2019-07-22 LAB — CBC
HCT: 47.9 % (ref 39.0–52.0)
Hemoglobin: 16.5 g/dL (ref 13.0–17.0)
MCH: 30.5 pg (ref 26.0–34.0)
MCHC: 34.4 g/dL (ref 30.0–36.0)
MCV: 88.5 fL (ref 80.0–100.0)
Platelets: 218 10*3/uL (ref 150–400)
RBC: 5.41 MIL/uL (ref 4.22–5.81)
RDW: 13.5 % (ref 11.5–15.5)
WBC: 7.7 10*3/uL (ref 4.0–10.5)
nRBC: 0 % (ref 0.0–0.2)

## 2019-07-22 LAB — GLUCOSE, CAPILLARY
Glucose-Capillary: 121 mg/dL — ABNORMAL HIGH (ref 70–99)
Glucose-Capillary: 95 mg/dL (ref 70–99)

## 2019-07-22 LAB — LUPUS ANTICOAGULANT PANEL
DRVVT: 46.7 s (ref 0.0–47.0)
PTT Lupus Anticoagulant: 34.7 s (ref 0.0–51.9)

## 2019-07-22 LAB — PROTEIN S, TOTAL: Protein S Ag, Total: 89 % (ref 60–150)

## 2019-07-22 LAB — PROTEIN S ACTIVITY: Protein S Activity: 102 % (ref 63–140)

## 2019-07-22 LAB — HEPARIN LEVEL (UNFRACTIONATED): Heparin Unfractionated: 0.47 IU/mL (ref 0.30–0.70)

## 2019-07-22 LAB — PROTEIN C ACTIVITY: Protein C Activity: 98 % (ref 73–180)

## 2019-07-22 MED ORDER — RIVAROXABAN (XARELTO) VTE STARTER PACK (15 & 20 MG)
ORAL_TABLET | ORAL | 0 refills | Status: DC
Start: 2019-07-22 — End: 2019-11-06

## 2019-07-22 MED ORDER — RIVAROXABAN 20 MG PO TABS: 20.0000 mg | ORAL_TABLET | Freq: Every day | ORAL | Status: DC

## 2019-07-22 MED ORDER — RIVAROXABAN 15 MG PO TABS
15.0000 mg | ORAL_TABLET | Freq: Two times a day (BID) | ORAL | Status: DC
  Administered 2019-07-22: 15 mg via ORAL
  Filled 2019-07-22: qty 1

## 2019-07-22 MED ORDER — METFORMIN HCL 500 MG PO TABS: 500.0000 mg | ORAL_TABLET | Freq: Two times a day (BID) | ORAL | 2 refills | Status: DC

## 2019-07-22 NOTE — Discharge Summary (Signed)
Physician Discharge Summary  Earl Donovan EAV:[id number] DOB: [birth year] DOA: 07/19/2019  PCP: [place], Betty G, MD  Admit date: 07/19/2019 Discharge date: 07/22/2019  Admitted From: Home Discharge disposition: Home   Recommendations for Outpatient Follow-Up:   Referral to outpatient bariatric weight loss Monitor echo and right heart xaretlto to treat PE  Discharge Diagnosis:   Principal Problem:   Bilateral pulmonary embolism ([name]) Active Problems:   Hyperlipidemia associated with type 2 diabetes mellitus ([name])   Morbid obesity with BMI of 50.0-59.9, adult ([name])   Essential hypertension, benign   Venous stasis dermatitis of both lower extremities   OSA on CPAP   Diabetes mellitus type II, non insulin dependent ([name])    Discharge Condition: Improved.  Diet recommendation: Low sodium, heart healthy.  Carbohydrate-modified  Wound care: None.  Code status: Full.   History of Present Illness:   Earl Donovan is a 54 y.o. male with medical history significant of morbid obesity (BMI 54); DM; and HTN presenting with SOB.  He reports a fever last week but developed SOB on Wednesday with ambulation.  It is fine when he is at rest. He did strenuous stuff yesterday afternoon and was scared he wouldn't be able to breathe.  Dry cough.  No further fevers.  His left leg might be more swollen than usual but he usually only wears a compression sleeve on the right.  He has B stasis dermatitis but also hit his left lower leg on the corner of stairs and it has been slow to heal.  He is quite sedentary - at work more than at home.  No prior h/o VTE.   Hospital Course by Problem:   PE -Patient withoutprior episodes of thromboembolic disease presenting with new BPE -heparin to xarelto -echo done: Right ventricular systolic function is moderately reduced-- will need to be followed closely -He does not appear to have had a hypercoagulable work-up in the past; while  this is somewhat academic at this pointit was ordered by the EDP;consider repeat of Proteins C and S when not in the acute setting -This clot likely occurred due to his sedentary lifestyle and he has been encouraged to increase his exercise and for weight loss -DVT r/o'd out  DM -resume home meds  HTN -BP on lower side -monitor outpatient  HLD -He does not appear to be taking medication for this issue at this time  OSA on CPAP -Continue CPAP  Morbid obesity Estimated body mass index is 54.12 kg/m as calculated from the following:   Height as of this encounter: 6' (1.829 m).   Weight as of this encounter: 181 kg.  -encouraged weight loss  Stasis dermatitis -Continue compression stocking (currently only on the RLE) -No current evidence of cellulitis    Medical Consultants:      Discharge Exam:   Vitals:   07/21/19 2257 07/22/19 0522  BP: 118/67 101/66  Pulse: 64 61  Resp: 20 18  Temp: 98.2 F (36.8 C) 98.3 F (36.8 C)  SpO2: 95% 97%   Vitals:   07/21/19 1158 07/21/19 1814 07/21/19 2257 07/22/19 0522  BP: 135/78 123/69 118/67 101/66  Pulse: 75 73 64 61  Resp: 18 18 20 18   Temp: 97.9 F (36.6 C) 98.7 F (37.1 C) 98.2 F (36.8 C) 98.3 F (36.8 C)  TempSrc:  Oral Oral Oral  SpO2: 96% 96% 95% 97%  Weight:      Height:        General exam:  Appears calm and comfortable.    The results of significant diagnostics from this hospitalization (including imaging, microbiology, ancillary and laboratory) are listed below for reference.     Procedures and Diagnostic Studies:   DG Chest 2 View  Result Date: 07/19/2019 CLINICAL DATA:  Shortness of breath. Patient reports shortness of breath with exertion. EXAM: CHEST - 2 VIEW COMPARISON:  None. FINDINGS: The cardiomediastinal contours are normal. Bronchial thickening. Pulmonary vasculature is normal. No consolidation, pleural effusion, or pneumothorax. No acute osseous abnormalities are seen. IMPRESSION:  Bronchial thickening can be seen with asthma or bronchitis. Electronically Signed   By: Narda Rutherford M.D.   On: 07/19/2019 21:59   CT Angio Chest PE W and/or Wo Contrast  Result Date: 07/20/2019 CLINICAL DATA:  Short of breath x2 days EXAM: CT ANGIOGRAPHY CHEST WITH CONTRAST TECHNIQUE: Multidetector CT imaging of the chest was performed using the standard protocol during bolus administration of intravenous contrast. Multiplanar CT image reconstructions and MIPs were obtained to evaluate the vascular anatomy. CONTRAST:  78mL OMNIPAQUE IOHEXOL 350 MG/ML SOLN COMPARISON:  None. FINDINGS: Cardiovascular: Heart size normal. No pericardial effusion. RV/LV ratio 0.9. There are multiple bilateral partially occlusive central and segmental pulmonary emboli, right worse than left. Adequate contrast opacification of the thoracic aorta with no evidence of dissection, aneurysm, or stenosis. There is classic 3-vessel brachiocephalic arch anatomy without proximal stenosis. Mild calcified atheromatous plaque in the arch. Visualized proximal abdominal aorta unremarkable. Mediastinum/Nodes: No mediastinal hematoma. No hilar or mediastinal adenopathy. Lungs/Pleura: No pleural effusion. No pneumothorax. No focal infiltrate, infarct, or nodule. Upper Abdomen: No acute findings Musculoskeletal: Mild superior endplate compression deformity of T10, age indeterminate. Review of the MIP images confirms the above findings. IMPRESSION: 1. Bilateral central and segmental pulmonary emboli, right worse than left. No CT evidence of right heart strain. Critical Value/emergent results were called by telephone at the time of interpretation on 07/20/2019 at 10:20 am to provider [facility] , who verbally acknowledged these results. 2. Mild T10 superior endplate compression deformity, age indeterminate. Aortic Atherosclerosis (ICD10-I70.0). Electronically Signed   By: Corlis Leak M.D.   On: 07/20/2019 10:21   ECHOCARDIOGRAM COMPLETE  Result  Date: 07/20/2019    ECHOCARDIOGRAM REPORT   Patient Name:   Earl Donovan [facility] Date of Exam: 07/20/2019 Medical Rec #:  [id number]              Height:       72.0 in Accession #:    [id number]             Weight:       399.0 lb Date of Birth:  25-Apr-1965              BSA:          2.857 m Patient Age:    54 years               BP:           149/78 mmHg Patient Gender: M                      HR:           75 bpm. Exam Location:  Inpatient Procedure: 2D Echo Indications:    Pulmonary Embolus I26.99  History:        Patient has no prior history of Echocardiogram examinations.                 Risk Factors:Hypertension.  Sonographer:  Thurman Coyer RDCS (AE) Referring Phys: [id number] JENNIFER YATES IMPRESSIONS  1. Left ventricular ejection fraction, by estimation, is 65 to 70%. The left ventricle has normal function. The left ventricle has no regional wall motion abnormalities. There is mild concentric left ventricular hypertrophy. Left ventricular diastolic parameters were normal.  2. Poor acoustical windows despite definity contrast. Gorss visualization of RV suggests Right ventricular systolic function is moderately reduced. The right ventricular size is moderately enlarged.  3. The mitral valve is normal in structure. No evidence of mitral valve regurgitation. No evidence of mitral stenosis.  4. The aortic valve is normal in structure. Aortic valve regurgitation is not visualized. No aortic stenosis is present.  5. The inferior vena cava is normal in size with greater than 50% respiratory variability, suggesting right atrial pressure of 3 mmHg. FINDINGS  Left Ventricle: Left ventricular ejection fraction, by estimation, is 65 to 70%. The left ventricle has normal function. The left ventricle has no regional wall motion abnormalities. Definity contrast agent was given IV to delineate the left ventricular  endocardial borders. The left ventricular internal cavity size was normal in size. There is mild concentric left  ventricular hypertrophy. Left ventricular diastolic parameters were normal. Normal left ventricular filling pressure. Right Ventricle: The right ventricular size is moderately enlarged. Right vetricular wall thickness was not assessed. Right ventricular systolic function is moderately reduced. Tricuspid regurgitation signal is inadequate for assessing PA pressure. Left Atrium: Left atrial size was not well visualized. Right Atrium: Right atrial size was not well visualized. Pericardium: There is no evidence of pericardial effusion. Mitral Valve: The mitral valve is normal in structure. Normal mobility of the mitral valve leaflets. No evidence of mitral valve regurgitation. No evidence of mitral valve stenosis. Tricuspid Valve: The tricuspid valve is normal in structure. Tricuspid valve regurgitation is not demonstrated. No evidence of tricuspid stenosis. Aortic Valve: The aortic valve is normal in structure. Aortic valve regurgitation is not visualized. No aortic stenosis is present. Pulmonic Valve: The pulmonic valve was normal in structure. Pulmonic valve regurgitation is not visualized. No evidence of pulmonic stenosis. Aorta: The aortic root is normal in size and structure. Venous: The inferior vena cava was not well visualized. The inferior vena cava is normal in size with greater than 50% respiratory variability, suggesting right atrial pressure of 3 mmHg. IAS/Shunts: No atrial level shunt detected by color flow Doppler.  LEFT VENTRICLE PLAX 2D LVIDd:         4.20 cm  Diastology LVIDs:         2.50 cm  LV e' lateral:   10.20 cm/s LV PW:         1.30 cm  LV E/e' lateral: 8.1 LV IVS:        1.30 cm  LV e' medial:    8.49 cm/s LVOT diam:     2.60 cm  LV E/e' medial:  9.8 LV SV:         133 LV SV Index:   46 LVOT Area:     5.31 cm  RIGHT VENTRICLE RV S prime:     11.60 cm/s TAPSE (M-mode): 2.1 cm LEFT ATRIUM             Index       RIGHT ATRIUM           Index LA diam:        3.80 cm 1.33 cm/m  RA Area:      19.40 cm LA Vol (A2C):   55.7  ml 19.49 ml/m RA Volume:   43.70 ml  15.29 ml/m LA Vol (A4C):   31.6 ml 11.06 ml/m LA Biplane Vol: 44.8 ml 15.68 ml/m  AORTIC VALVE LVOT Vmax:   130.00 cm/s LVOT Vmean:  88.100 cm/s LVOT VTI:    0.250 m  AORTA Ao Root diam: 2.80 cm MITRAL VALVE MV Area (PHT): 2.45 cm    SHUNTS MV Decel Time: 310 msec    Systemic VTI:  0.25 m MV E velocity: 82.90 cm/s  Systemic Diam: 2.60 cm MV A velocity: 87.10 cm/s MV E/A ratio:  0.95 Armanda Magic MD Electronically signed by Armanda Magic MD Signature Date/Time: 07/20/2019/4:48:40 PM    Final    VAS [place] LOWER EXTREMITY VENOUS (DVT)  Result Date: 07/21/2019  Lower Venous DVTStudy Indications: Pulmonary embolism.  Limitations: Poor ultrasound/tissue interface and body habitus. Comparison Study: no prior Performing Technologist: Blanch Media RVS  Examination Guidelines: A complete evaluation includes B-mode imaging, spectral Doppler, color Doppler, and power Doppler as needed of all accessible portions of each vessel. Bilateral testing is considered an integral part of a complete examination. Limited examinations for reoccurring indications may be performed as noted. The reflux portion of the exam is performed with the patient in reverse Trendelenburg.  +---------+---------------+---------+-----------+----------+--------------+ RIGHT    CompressibilityPhasicitySpontaneityPropertiesThrombus Aging +---------+---------------+---------+-----------+----------+--------------+ CFV      Full           Yes      Yes                                 +---------+---------------+---------+-----------+----------+--------------+ SFJ      Full                                                        +---------+---------------+---------+-----------+----------+--------------+ FV Prox  Full                                                        +---------+---------------+---------+-----------+----------+--------------+ FV Mid   Full                                                         +---------+---------------+---------+-----------+----------+--------------+ FV Distal               Yes      Yes                                 +---------+---------------+---------+-----------+----------+--------------+ PFV      Full                                                        +---------+---------------+---------+-----------+----------+--------------+ POP      Full  Yes      Yes                                 +---------+---------------+---------+-----------+----------+--------------+ PTV      Full                                                        +---------+---------------+---------+-----------+----------+--------------+ PERO                                                  Not visualized +---------+---------------+---------+-----------+----------+--------------+   +---------+---------------+---------+-----------+----------+-----------------+ LEFT     CompressibilityPhasicitySpontaneityPropertiesThrombus Aging    +---------+---------------+---------+-----------+----------+-----------------+ CFV      Full                                                           +---------+---------------+---------+-----------+----------+-----------------+ SFJ      Full                                                           +---------+---------------+---------+-----------+----------+-----------------+ FV Prox  Full                                                           +---------+---------------+---------+-----------+----------+-----------------+ FV Mid                  Yes      Yes                                    +---------+---------------+---------+-----------+----------+-----------------+ FV Distal               Yes      Yes                                    +---------+---------------+---------+-----------+----------+-----------------+ PFV      Full                                                            +---------+---------------+---------+-----------+----------+-----------------+ POP      None           Yes      Yes                  Age Indeterminate +---------+---------------+---------+-----------+----------+-----------------+ PTV  Not visualized    +---------+---------------+---------+-----------+----------+-----------------+ PERO                                                  Not visualized    +---------+---------------+---------+-----------+----------+-----------------+     Summary: RIGHT: - There is no evidence of deep vein thrombosis in the lower extremity.  - No cystic structure found in the popliteal fossa.  LEFT: - Findings consistent with age indeterminate deep vein thrombosis involving the left popliteal vein. - No cystic structure found in the popliteal fossa.  *See table(s) above for measurements and observations. Electronically signed by Fabienne Bruns MD on 07/21/2019 at 7:15:42 PM.    Final      Labs:   Basic Metabolic Panel: Recent Labs  Lab 07/19/19 2136 07/21/19 0446  NA 137 134*  K 4.3 3.8  CL 102 99  CO2 23 25  GLUCOSE 106* 124*  BUN 8 8  CREATININE 0.93 0.85  CALCIUM 9.3 8.7*   GFR Estimated Creatinine Clearance: 167.2 mL/min (by C-Donovan formula based on SCr of 0.85 mg/dL). Liver Function Tests: Recent Labs  Lab 07/19/19 2136  AST 32  ALT 18  ALKPHOS 60  BILITOT 1.2  PROT 8.7*  ALBUMIN 4.0   No results for input(s): LIPASE, AMYLASE in the last 168 hours. No results for input(s): AMMONIA in the last 168 hours. Coagulation profile No results for input(s): INR, PROTIME in the last 168 hours.  CBC: Recent Labs  Lab 07/19/19 2136 07/21/19 0446 07/22/19 0840  WBC 9.0 9.3 7.7  NEUTROABS 4.9  --   --   HGB 17.0 15.4 16.5  HCT 51.1 46.2 47.9  MCV 90.1 89.4 88.5  PLT 223 200 218   Cardiac Enzymes: No results for input(s): CKTOTAL, CKMB,  CKMBINDEX, TROPONINI in the last 168 hours. BNP: Invalid input(s): POCBNP CBG: Recent Labs  Lab 07/21/19 0630 07/21/19 1157 07/21/19 1538 07/21/19 2201 07/22/19 0510  GLUCAP 131* 146* 113* 121* 121*   D-Dimer Recent Labs    07/20/19 0800  DDIMER 9.34*   Hgb A1c Recent Labs    07/20/19 1717  HGBA1C 6.5*   Lipid Profile No results for input(s): CHOL, HDL, LDLCALC, TRIG, CHOLHDL, LDLDIRECT in the last 72 hours. Thyroid function studies No results for input(s): TSH, T4TOTAL, T3FREE, THYROIDAB in the last 72 hours.  Invalid input(s): FREET3 Anemia work up No results for input(s): VITAMINB12, FOLATE, FERRITIN, TIBC, IRON, RETICCTPCT in the last 72 hours. Microbiology Recent Results (from the past 240 hour(s))  SARS Coronavirus 2 by RT PCR (hospital order, performed in [facility] hospital lab) Nasopharyngeal Nasopharyngeal Swab     Status: None   Collection Time: 07/20/19  8:00 AM   Specimen: Nasopharyngeal Swab  Result Value Ref Range Status   SARS Coronavirus 2 NEGATIVE NEGATIVE Final    Comment: (NOTE) SARS-CoV-2 target nucleic acids are NOT DETECTED. The SARS-CoV-2 RNA is generally detectable in upper and lower respiratory specimens during the acute phase of infection. The lowest concentration of SARS-CoV-2 viral copies this assay can detect is 250 copies / mL. A negative result does not preclude SARS-CoV-2 infection and should not be used as the sole basis for treatment or other patient management decisions.  A negative result may occur with improper specimen collection / handling, submission of specimen other than nasopharyngeal swab, presence of viral mutation(s) within the areas targeted  by this assay, and inadequate number of viral copies (<250 copies / mL). A negative result must be combined with clinical observations, patient history, and epidemiological information. Fact Sheet for Patients:   BoilerBrush.com.cy Fact Sheet for  Healthcare Providers: https://pope.com/ This test is not yet approved or cleared  by the [place] FDA and has been authorized for detection and/or diagnosis of SARS-CoV-2 by FDA under an Emergency Use Authorization (EUA).  This EUA will remain in effect (meaning this test can be used) for the duration of the COVID-19 declaration under Section 564(b)(1) of the Act, 21 U.S.C. section 360bbb-3(b)(1), unless the authorization is terminated or revoked sooner. Performed at [facility], [address] [place], [place] [address]      Discharge Instructions:   Discharge Instructions    Diet - low sodium heart healthy   Complete by: As directed    Diet Carb Modified   Complete by: As directed    Increase activity slowly   Complete by: As directed      Allergies as of 07/22/2019   No Known Allergies     Medication List    TAKE these medications   allopurinol 300 MG tablet Commonly known as: ZYLOPRIM Take 1 tablet (300 mg total) by mouth daily.   hydrochlorothiazide 25 MG tablet Commonly known as: HYDRODIURIL Take 1 tablet (25 mg total) by mouth daily.   metFORMIN 500 MG tablet Commonly known as: GLUCOPHAGE Take 1 tablet (500 mg total) by mouth 2 (two) times daily with a meal. Start taking on: July 23, 2019   olmesartan 20 MG tablet Commonly known as: BENICAR Take 1 tablet (20 mg total) by mouth daily.   Rivaroxaban Stater Pack (15 mg and 20 mg) Commonly known as: XARELTO STARTER PACK Follow package directions: Take one  tablet by mouth twice a day. On day 22, switch to one  tablet once a day. Take with food.   silver sulfADIAZINE 1 % cream Commonly known as: Silvadene Apply 1 application topically daily.      Follow-up Information    [place], Betty G, MD Follow up in 1 week(s).   Specialty: Family Medicine Contact information: [address] [place] [address] [phone]            Time coordinating  discharge: 35 min  Signed:  Joseph Art DO  [facility] 07/22/2019, 10:20 AM

## 2019-07-22 NOTE — Progress Notes (Signed)
Discharge instructions reviewed with pt.  Copy of instructions given to pt, script filled for xarelto thru [facility] and was delivered to pt.   Pt d/c'd via wheelchair with belongings, pt's car here at hospital and can drive self home, cleared with MD.             Escorted by hospital volunteer.

## 2019-07-22 NOTE — Discharge Instructions (Addendum)

## 2019-07-22 NOTE — Progress Notes (Signed)
SATURATION QUALIFICATIONS: (This note is used to comply with regulatory documentation for home oxygen)  Patient Saturations on Room Air at Rest = 98%  Patient Saturations on Room Air while Ambulating = 92-96%  Patient Saturations on 0 Liters of oxygen while Ambulating = 92-96%  Please briefly explain why patient needs home oxygen:   Pt sat 98% at rest, ambulated in hall on RA with nurse, sats 92-96%, lowest sat 92% majority of walk.  Had slight SOB pt stated on way back to room, no obvious signs of SOB noted by nurse while pt walking.

## 2019-07-22 NOTE — TOC Initial Note (Signed)
Transition of Care [facility]) - Initial/Assessment Note    Patient Details  Name: Earl Donovan MRN: [id number] Date of Birth: 06-08-1965  Transition of Care [facility]) CM/SW Contact:    Marilu Favre, RN Phone Number: 07/22/2019, 10:18 AM  Clinical Narrative:                 Patient new Xarelto start. Changed pharmacy to [facility] they will use 30 day free card and provide 30 days free of Xarelto. Provided patient with co pay card.   Patient voiced understanding  Expected Discharge Plan: Home/Self Care Barriers to Discharge: Continued Medical Work up   Patient Goals and CMS Choice Patient states their goals for this hospitalization and ongoing recovery are:: to return to home CMS Medicare.gov Compare Post Acute Care list provided to:: Patient Choice offered to / list presented to : Patient  Expected Discharge Plan and Services Expected Discharge Plan: Home/Self Care   Discharge Planning Services: CM Consult, Medication Assistance Post Acute Care Choice: NA Living arrangements for the past 2 months: Single Family Home                 DME Arranged: N/A DME Agency: NA       HH Arranged: NA          Prior Living Arrangements/Services Living arrangements for the past 2 months: Single Family Home Lives with:: Spouse Patient language and need for interpreter reviewed:: Yes        Need for Family Participation in Patient Care: Yes (Comment) Care giver support system in place?: Yes (comment)   Criminal Activity/Legal Involvement Pertinent to Current Situation/Hospitalization: No - Comment as needed  Activities of Daily Living Home Assistive Devices/Equipment: None ADL Screening (condition at time of admission) Patient's cognitive ability adequate to safely complete daily activities?: No Is the patient deaf or have difficulty hearing?: No Does the patient have difficulty seeing, even when wearing glasses/contacts?: No Does the patient have difficulty concentrating,  remembering, or making decisions?: No Patient able to express need for assistance with ADLs?: Yes Does the patient have difficulty dressing or bathing?: No Independently performs ADLs?: Yes (appropriate for developmental age) Does the patient have difficulty walking or climbing stairs?: No Weakness of Legs: None Weakness of Arms/Hands: None  Permission Sought/Granted   Permission granted to share information with : No              Emotional Assessment Appearance:: Appears stated age Attitude/Demeanor/Rapport: Engaged Affect (typically observed): Accepting Orientation: : Oriented to Situation, Oriented to  Time, Oriented to Place, Oriented to Self Alcohol / Substance Use: Not Applicable Psych Involvement: No (comment)  Admission diagnosis:  Dyspnea on exertion [R06.00] Bilateral pulmonary embolism (HCC) [I26.99] Other acute pulmonary embolism without acute cor pulmonale (HCC) [I26.99] Patient Active Problem List   Diagnosis Date Noted  . Bilateral pulmonary embolism ([place]) 07/20/2019  . Diabetes mellitus type II, non insulin dependent ([place]) 02/03/2017  . Hyperlipidemia associated with type 2 diabetes mellitus ([place]) 07/31/2015  . Gout, arthropathy 07/31/2015  . Morbid obesity with BMI of 50.0-59.9, adult ([place]) 07/31/2015  . Essential hypertension, benign 07/31/2015  . Venous stasis dermatitis of both lower extremities 07/31/2015  . OSA on CPAP 07/31/2015  . Spinal stenosis of lumbar region 07/23/2012  . Numbness 07/23/2012  . Back pain, chronic 07/23/2012   PCP:  Martinique, Betty G, MD Pharmacy:   [id number] [place], [place] [address] Phone: [phone] Fax: [phone]  [facility] [place], [place] - [address] [place] [address] Phone: [phone] Fax: [phone]     Social Determinants of Health (SDOH) Interventions    Readmission Risk  Interventions No flowsheet data found.

## 2019-07-22 NOTE — TOC Benefit Eligibility Note (Signed)
Transition of Care [facility]) Benefit Eligibility Note    Patient Details  Name: Earl Donovan MRN: [id number] Date of Birth: 1965/06/08   Medication/Dose: 1.Eliquis 5mg  bid      2.Xarelto 20mg  qd     3.Pradaxa 150mg  bid  Covered?: Yes     Prescription [facility]age [facility]  Spoke with Person/Company/Phone Number:: [employer]  Co-Pay: 1.Eliquis: $35 for 30 day retail      2.Xarelto: $35 for 30 day retail      3.Pradaxa: $35 for 30 day retail  Prior Approval: No     Additional Notes: Patient is elegible to use rx coupon cards    Phone Number: 07/22/2019, 9:19 AM

## 2019-07-22 NOTE — Progress Notes (Addendum)
ANTICOAGULATION CONSULT NOTE - Follow Up Consult  Pharmacy Consult for Heparin>>Xarelto Indication: pulmonary embolus  No Known Allergies  Patient Measurements: Height: 6' (182.9 cm) Weight: (!) 181 kg (399 lb 0.5 oz) IBW/kg (Calculated) : 77.6 Heparin Dosing Weight:  122.2 kg  Vital Signs: Temp: 98.3 F (36.8 C) (06/07 0522) Temp Source: Oral (06/07 0522) BP: 101/66 (06/07 0522) Pulse Rate: 61 (06/07 0522)  Labs: Recent Labs    07/19/19 2136 07/19/19 2136 07/20/19 1035 07/20/19 1216 07/20/19 1805 07/21/19 0446 07/22/19 0840  HGB 17.0   < >  --   --   --  15.4 16.5  HCT 51.1  --   --   --   --  46.2 47.9  PLT 223  --   --   --   --  200 218  HEPARINUNFRC  --   --   --   --  0.59 0.60 0.47  CREATININE 0.93  --   --   --   --  0.85  --   TROPONINIHS  --   --  48* 52*  --   --   --    < > = values in this interval not displayed.    Estimated Creatinine Clearance: 167.2 mL/min (by C-G formula based on SCr of 0.85 mg/dL).  Assessment:  Anticoag: Heparin gtt for bilateral PE. No AC PTA.  - Hep level 0.47 in goal. CBC WNL. Change to Xarelto on 6/7.  Goal of Therapy:  Heparin level 0.3-0.7 units/ml Monitor platelets by anticoagulation protocol: Yes   Plan:  D/c IV heparin Change to Xarelto 15mg  BID x 21d then 20mg  daily   Hayden Mabin S. , PharmD, BCPS Clinical Staff Pharmacist [email or web address] , Merilynn Finland 07/22/2019,9:31 AM

## 2019-07-22 NOTE — Care Management (Signed)
Entered benefits check for   Xarelto 20 mg BID for 30 days  Eliquis 2.5 mg daily  for 30 days    Can provide co pay card once determination on which medication is made.   Ronny Flurry

## 2019-07-25 LAB — FACTOR 5 LEIDEN

## 2019-07-26 LAB — PROTHROMBIN GENE MUTATION

## 2019-07-29 ENCOUNTER — Ambulatory Visit: Payer: Commercial Managed Care - PPO | Admitting: Family Medicine

## 2019-07-29 ENCOUNTER — Encounter: Payer: Self-pay | Admitting: Family Medicine

## 2019-07-29 ENCOUNTER — Other Ambulatory Visit: Payer: Self-pay

## 2019-07-29 VITALS — BP 118/78 | HR 69 | Temp 97.5°F | Resp 16 | Ht 72.0 in | Wt 392.0 lb

## 2019-07-29 DIAGNOSIS — I2699 Other pulmonary embolism without acute cor pulmonale: Secondary | ICD-10-CM | POA: Diagnosis not present

## 2019-07-29 DIAGNOSIS — I10 Essential (primary) hypertension: Secondary | ICD-10-CM

## 2019-07-29 DIAGNOSIS — E119 Type 2 diabetes mellitus without complications: Secondary | ICD-10-CM

## 2019-07-29 DIAGNOSIS — G4733 Obstructive sleep apnea (adult) (pediatric): Secondary | ICD-10-CM

## 2019-07-29 DIAGNOSIS — Z9989 Dependence on other enabling machines and devices: Secondary | ICD-10-CM

## 2019-07-29 DIAGNOSIS — I50810 Right heart failure, unspecified: Secondary | ICD-10-CM

## 2019-07-29 DIAGNOSIS — Z6841 Body Mass Index (BMI) 40.0 and over, adult: Secondary | ICD-10-CM

## 2019-07-29 NOTE — Progress Notes (Addendum)
HPI:  [name] is a 54 y.o. male, who is here today to follow on recent hospitalization. He presented to the ER complaining of 2 days of dyspnea. Chest CTA showed bilateral central and segmental pulmonary emboli, right worse than left. No CT evidence of right heart strain.  Currently he is on Xarelto 15 mg twice daily. He is tolerating medication well.  Echo 07/20/19: LVEF 65-70%,mild concentric LVH,RVE moderately reduced.  Lab Results  Component Value Date   CREATININE 0.85 07/21/2019   BUN 8 07/21/2019   NA 134 (L) 07/21/2019   K 3.8 07/21/2019   CL 99 07/21/2019   CO2 25 07/21/2019   DM2: Resumed Metformin 500 mg twice daily after hospital discharge. He is not checking BS. Negative for polydipsia,polyuria, or polyphagia.  Lab Results  Component Value Date   HGBA1C 6.5 (H) 07/20/2019   Lab Results  Component Value Date   WBC 7.7 07/22/2019   HGB 16.5 07/22/2019   HCT 47.9 07/22/2019   MCV 88.5 07/22/2019   PLT 218 07/22/2019   He started exercising some and try to follow a healthier diet.  Hypertension: He stopped taking Benicar because BP was "low" and he was having dizziness. 112/62,117/68. He is still taking HCTZ 25 mg daily. He is not having CP.dyspnea, palpitations,PND,orthopnea,or worsening edema. OSA: He is wearing his CPAP every night.  Review of Systems  Constitutional: Negative for activity change, appetite change, fatigue and fever.  HENT: Negative for mouth sores, nosebleeds and sore throat.   Eyes: Negative for redness and visual disturbance.  Respiratory: Negative for cough and wheezing.   Gastrointestinal: Negative for abdominal pain, blood in stool, nausea and vomiting.  Genitourinary: Negative for decreased urine volume, dysuria and hematuria.  Skin: Negative for rash and wound.  Neurological: Negative for tremors, syncope, facial asymmetry and headaches.  Rest see pertinent positives and negatives per HPI.  Current  Outpatient Medications on File Prior to Visit  Medication Sig Dispense Refill   allopurinol (ZYLOPRIM) 300 MG tablet Take 1 tablet (300 mg total) by mouth daily. 90 tablet 2   hydrochlorothiazide (HYDRODIURIL) 25 MG tablet Take 1 tablet (25 mg total) by mouth daily. 90 tablet 0   metFORMIN (GLUCOPHAGE) 500 MG tablet Take 1 tablet (500 mg total) by mouth 2 (two) times daily with a meal. 180 tablet 2   olmesartan (BENICAR) 20 MG tablet Take 1 tablet (20 mg total) by mouth daily. 90 tablet 2   RIVAROXABAN (XARELTO) VTE STARTER PACK (15 & 20 MG TABLETS) Follow package directions: Take one 15mg  tablet by mouth twice a day. On day 22, switch to one 20mg  tablet once a day. Take with food. 51 each 0   silver sulfADIAZINE (SILVADENE) 1 % cream Apply 1 application topically daily. 50 g 1   No current facility-administered medications on file prior to visit.     Past Medical History:  Diagnosis Date   Arthritis    Lower back pain   Gout    Hypertension    Morbid obesity with BMI of 50.0-59.9, adult (HCC)    Pulmonary embolism (HCC)    No Known Allergies  Social History   Socioeconomic History   Marital status: Married    Spouse name: Not on file   Number of children: Not on file   Years of education: Not on file   Highest education level: Not on file  Occupational History   Not on file  Tobacco Use   Smoking status:  Never Smoker   Smokeless tobacco: Never Used  Substance and Sexual Activity   Alcohol use: Yes    Comment: occasional   Drug use: No   Sexual activity: Yes  Other Topics Concern   Not on file  Social History Narrative   Not on file   Social Determinants of Health   Financial Resource Strain:    Difficulty of Paying Living Expenses:   Food Insecurity:    Worried About Programme researcher, broadcasting/film/video in the Last Year:    Barista in the Last Year:   Transportation Needs:    Freight forwarder (Medical):    Lack of Transportation  (Non-Medical):   Physical Activity:    Days of Exercise per Week:    Minutes of Exercise per Session:   Stress:    Feeling of Stress :   Social Connections:    Frequency of Communication with Friends and Family:    Frequency of Social Gatherings with Friends and Family:    Attends Religious Services:    Active Member of Clubs or Organizations:    Attends Banker Meetings:    Marital Status:     Vitals:   07/29/19 0823  BP: 118/78  Pulse: 69  Resp: 16  Temp: (!) 97.5 F (36.4 C)  SpO2: 95%   Wt Readings from Last 3 Encounters:  07/29/19 (!) 392 lb (177.8 kg)  07/19/19 (!) 399 lb 0.5 oz (181 kg)  04/30/18 (!) 390 lb (176.9 kg)    Body mass index is 53.16 kg/m.  Physical Exam  Nursing note reviewed. Constitutional: He is oriented to person, place, and time. He appears well-developed. No distress.  HENT:  Head: Normocephalic and atraumatic.  Mouth/Throat: Mucous membranes are moist.  Eyes: Pupils are equal, round, and reactive to light. Conjunctivae are normal.  Cardiovascular: Normal rate and regular rhythm.  No murmur heard. Pulses:      Dorsalis pedis pulses are 2+ on the right side and 2+ on the left side.  Respiratory: Effort normal and breath sounds normal. No respiratory distress.  GI: Soft. Normal appearance. He exhibits no mass. There is no hepatomegaly. There is no abdominal tenderness.  Musculoskeletal:     Right lower leg: Edema present.     Left lower leg: Edema present.     Comments: 1+ pitting LE edema, bilateral lymphedema.  Lymphadenopathy:    He has no cervical adenopathy.  Neurological: He is alert and oriented to person, place, and time. No cranial nerve deficit. Gait normal.  Skin: Skin is warm. No ecchymosis noted.  Pretibial skin changes related to venous stasis dermatitis.  Psychiatric:  Well groomed, good eye contact.   ASSESSMENT AND PLAN:  [name] was seen today for hospitalization follow-up.  Diagnoses and  all orders for this visit:  Orders Placed This Encounter  Procedures   ECHOCARDIOGRAM COMPLETE    Right heart failure with reduced right ventricular function (HCC) Will re-evaluate RV function, echo will be arranged. Instructed about warning signs.  Bilateral pulmonary embolism (HCC) No known etiologic factor. We discussed current recommendations in regard to anticoagulation and side effects of medication. Complete 21 days of Xarelto 15 mg bid and then continue 20 mg daily for 3-6 months. Will arrange echo to follow on RV function. Instructed about warning signs.  Essential hypertension, benign BP adequately controlled. Continue HCTZ 25 mg daily and monitoring BP closely. If BP goes up,instrcted to resume Benicar but 20 mg instead 40 mg daily. Low salt  diet.  Morbid obesity with BMI of 50.0-59.9, adult ([place]) We discussed benefits of wt loss as well as adverse effects of obesity. Consistency with healthy diet and physical activity recommended. Daily brisk walking for 15-30 min as tolerated.   OSA on CPAP Compliant with wearing CPAP. Wt loss will also help.  Diabetes mellitus type II, non insulin dependent (HCC) HgA1C at goal. No changes in current management. Regular exercise and healthy diet with avoidance of added sugar food intake is an important part of treatment and recommended. Annual eye exam, periodic dental and foot care recommended. F/U in 5-6 months   Return in about 3 months (around 10/29/2019).  Shawniece Oyola G. Martinique, MD  [facility]. [place] office.   A few things to remember from today's visit:   Continue increasing physical activity gradually. Will plan on repeating echo. No changes in blood pressure meds, if numbers start getting close to 140/90, resume Benicar but 1/2 dose.   If you need refills please call your pharmacy. Do not use My Chart to request refills or for acute issues that need immediate attention.    Please be sure  medication list is accurate. If a new problem present, please set up appointment sooner than planned today.

## 2019-07-29 NOTE — Assessment & Plan Note (Signed)
Compliant with wearing CPAP. Wt loss will also help.

## 2019-07-29 NOTE — Patient Instructions (Signed)
A few things to remember from today's visit:   Continue increasing physical activity gradually. Will plan on repeating echo. No changes in blood pressure meds, if numbers start getting close to 140/90, resume Benicar but 1/2 dose.   If you need refills please call your pharmacy. Do not use My Chart to request refills or for acute issues that need immediate attention.    Please be sure medication list is accurate. If a new problem present, please set up appointment sooner than planned today.

## 2019-07-29 NOTE — Assessment & Plan Note (Signed)
BP adequately controlled. Continue HCTZ 25 mg daily and monitoring BP closely. If BP goes up,instrcted to resume Benicar but 20 mg instead 40 mg daily. Low salt diet.

## 2019-07-29 NOTE — Assessment & Plan Note (Signed)
HgA1C at goal. No changes in current management. Regular exercise and healthy diet with avoidance of added sugar food intake is an important part of treatment and recommended. Annual eye exam, periodic dental and foot care recommended. F/U in 5-6 months  

## 2019-07-29 NOTE — Assessment & Plan Note (Signed)
We discussed benefits of wt loss as well as adverse effects of obesity. Consistency with healthy diet and physical activity recommended. Daily brisk walking for 15-30 min as tolerated.  

## 2019-07-29 NOTE — Assessment & Plan Note (Signed)
No known etiologic factor. We discussed current recommendations in regard to anticoagulation and side effects of medication. Complete 21 days of Xarelto 15 mg bid and then continue 20 mg daily for 3-6 months. Will arrange echo to follow on RV function. Instructed about warning signs.

## 2019-08-15 ENCOUNTER — Telehealth: Payer: Self-pay | Admitting: Family Medicine

## 2019-08-15 ENCOUNTER — Other Ambulatory Visit: Payer: Self-pay | Admitting: *Deleted

## 2019-08-15 MED ORDER — RIVAROXABAN 20 MG PO TABS
20.0000 mg | ORAL_TABLET | Freq: Every day | ORAL | 2 refills | Status: DC
Start: 2019-08-15 — End: 2019-11-06

## 2019-08-15 NOTE — Telephone Encounter (Signed)
Pt is requesting a [facility] [facility]. Thanks

## 2019-08-15 NOTE — Telephone Encounter (Signed)
Rx sent to the pharmacy as requested. ?

## 2019-08-30 ENCOUNTER — Other Ambulatory Visit: Payer: Self-pay

## 2019-08-30 ENCOUNTER — Ambulatory Visit (HOSPITAL_COMMUNITY): Payer: 59 | Attending: Cardiovascular Disease

## 2019-08-30 DIAGNOSIS — I2699 Other pulmonary embolism without acute cor pulmonale: Secondary | ICD-10-CM

## 2019-08-30 DIAGNOSIS — I50810 Right heart failure, unspecified: Secondary | ICD-10-CM | POA: Diagnosis present

## 2019-08-30 LAB — ECHOCARDIOGRAM COMPLETE
Area-P 1/2: 3.77 cm2
S' Lateral: 2.8 cm

## 2019-08-30 MED ORDER — PERFLUTREN LIPID MICROSPHERE
1.0000 mL | INTRAVENOUS | Status: AC | PRN
  Administered 2019-08-30 (×2): 2 mL via INTRAVENOUS

## 2019-10-29 ENCOUNTER — Ambulatory Visit: Payer: Commercial Managed Care - PPO | Admitting: Family Medicine

## 2019-11-04 ENCOUNTER — Other Ambulatory Visit: Payer: Self-pay

## 2019-11-05 ENCOUNTER — Ambulatory Visit: Payer: 59 | Admitting: Family Medicine

## 2019-11-05 ENCOUNTER — Encounter: Payer: Self-pay | Admitting: Family Medicine

## 2019-11-05 VITALS — BP 126/80 | HR 86 | Temp 98.3°F | Resp 16 | Ht 72.0 in | Wt >= 6400 oz

## 2019-11-05 DIAGNOSIS — I872 Venous insufficiency (chronic) (peripheral): Secondary | ICD-10-CM | POA: Diagnosis not present

## 2019-11-05 DIAGNOSIS — E119 Type 2 diabetes mellitus without complications: Secondary | ICD-10-CM

## 2019-11-05 DIAGNOSIS — E785 Hyperlipidemia, unspecified: Secondary | ICD-10-CM

## 2019-11-05 DIAGNOSIS — E1169 Type 2 diabetes mellitus with other specified complication: Secondary | ICD-10-CM

## 2019-11-05 DIAGNOSIS — I10 Essential (primary) hypertension: Secondary | ICD-10-CM | POA: Diagnosis not present

## 2019-11-05 DIAGNOSIS — I2699 Other pulmonary embolism without acute cor pulmonale: Secondary | ICD-10-CM

## 2019-11-05 DIAGNOSIS — Z6841 Body Mass Index (BMI) 40.0 and over, adult: Secondary | ICD-10-CM

## 2019-11-05 MED ORDER — TRIAMCINOLONE 0.1 % CREAM:EUCERIN CREAM 1:1
1.0000 | TOPICAL_CREAM | Freq: Two times a day (BID) | CUTANEOUS | 1 refills | Status: DC | PRN
Start: 2019-11-05 — End: 2020-11-23

## 2019-11-05 NOTE — Assessment & Plan Note (Signed)
Adequately controlled. No changes in current management. DASH diet recommended. Eye exam is current.

## 2019-11-05 NOTE — Assessment & Plan Note (Signed)
HgA1C has been at goal. He will benefit from GLP1 , she prefers to wait before adding new meds. No changes in current management.  Regular exercise and healthy diet with avoidance of added sugar food intake is an important part of treatment and recommended. Annual eye exam, periodic dental and foot care recommended. F/U in 5-6 months

## 2019-11-05 NOTE — Assessment & Plan Note (Signed)
Steadily gaining wt. We discussed benefits of wt loss as well as adverse effects of obesity. Consistency with healthy diet and physical activity recommended. 10-15 min of daily walking or 10,000 steps daily recommended.

## 2019-11-05 NOTE — Assessment & Plan Note (Signed)
He is not on statin, we discussed some CV benefits. Recommendations will be given according to FLP results.

## 2019-11-05 NOTE — Assessment & Plan Note (Signed)
Continue appropriate skin care. No changes in current management. Wt loss will help.

## 2019-11-05 NOTE — Progress Notes (Signed)
HPI: [name] is a 54 y.o. male, who is here today for 4 months follow up.   He was last seen on 07/29/2019. No new problems since his last visit. On 07/20/2019 he was diagnosed with bilateral pulmonary embolism.Bilateral central and segmental pulmonary emboli, right worse.than left.  Currently he is on Xarelto 20 mg daily. He has not noticed side effects. Negative for CP, palpitation, or dyspnea.  DM2:  Currently is on Metformin 500 mg twice daily. Negative for polydipsia, polyuria, polyphagia.  Lab Results  Component Value Date   HGBA1C 6.5 (H) 07/20/2019   Hyperlipidemia: Currently he is not taking statin medication.  Lab Results  Component Value Date   CHOL 144 04/30/2018   HDL 45.30 04/30/2018   LDLCALC 83 04/30/2018   TRIG 80.0 04/30/2018   CHOLHDL 3 04/30/2018   Hypertension: He is on HCTZ 25 mg daily. He did not resume olmesartan 20 mg because BP's at home are "good."  Lab Results  Component Value Date   CREATININE 0.85 07/21/2019   BUN 8 07/21/2019   NA 134 (L) 07/21/2019   K 3.8 07/21/2019   CL 99 07/21/2019   CO2 25 07/21/2019   Negative for severe/frequent headache, visual changes, claudication, focal weakness, or worsening edema.  He has been walking about 3500 steps daily at work. He has not been consistent with following a healthful diet.  Venous stasis dermatitis: Currently he is on Eucerin-triamcinolone mixture, which does help with symptoms. Negative for fever, orthopnea, and PND.  Review of Systems  Constitutional: Negative for activity change, appetite change and fatigue.  HENT: Negative for nosebleeds and sore throat.   Respiratory: Negative for cough and wheezing.   Gastrointestinal: Negative for abdominal pain, blood in stool, nausea and vomiting.  Genitourinary: Negative for decreased urine volume, dysuria and hematuria.  Skin: Negative for rash and wound.  Neurological: Negative for syncope, facial asymmetry and  weakness.  Rest of ROS, see pertinent positives sand negatives in HPI  Current Outpatient Medications on File Prior to Visit  Medication Sig Dispense Refill  . allopurinol (ZYLOPRIM) 300 MG tablet Take 1 tablet (300 mg total) by mouth daily. 90 tablet 2  . hydrochlorothiazide (HYDRODIURIL) 25 MG tablet Take 1 tablet (25 mg total) by mouth daily. 90 tablet 0  . metFORMIN (GLUCOPHAGE) 500 MG tablet Take 1 tablet (500 mg total) by mouth 2 (two) times daily with a meal. 180 tablet 2   No current facility-administered medications on file prior to visit.    Past Medical History:  Diagnosis Date  . Arthritis    Lower back pain  . Gout   . Hypertension   . Morbid obesity with BMI of 50.0-59.9, adult (HCC)   . Pulmonary embolism (HCC)    No Known Allergies  Social History   Socioeconomic History  . Marital status: Married    Spouse name: Not on file  . Number of children: Not on file  . Years of education: Not on file  . Highest education level: Not on file  Occupational History  . Not on file  Tobacco Use  . Smoking status: Never Smoker  . Smokeless tobacco: Never Used  Substance and Sexual Activity  . Alcohol use: Yes    Comment: occasional  . Drug use: No  . Sexual activity: Yes  Other Topics Concern  . Not on file  Social History Narrative  . Not on file   Social Determinants of Health   Financial Resource Strain:   .  Difficulty of Paying Living Expenses: Not on file  Food Insecurity:   . Worried About Programme researcher, broadcasting/film/video in the Last Year: Not on file  . Ran Out of Food in the Last Year: Not on file  Transportation Needs:   . Lack of Transportation (Medical): Not on file  . Lack of Transportation (Non-Medical): Not on file  Physical Activity:   . Days of Exercise per Week: Not on file  . Minutes of Exercise per Session: Not on file  Stress:   . Feeling of Stress : Not on file  Social Connections:   . Frequency of Communication with Friends and Family: Not on  file  . Frequency of Social Gatherings with Friends and Family: Not on file  . Attends Religious Services: Not on file  . Active Member of Clubs or Organizations: Not on file  . Attends Banker Meetings: Not on file  . Marital Status: Not on file   Vitals:   11/05/19 0737  BP: 126/80  Pulse: 86  Resp: 16  Temp: 98.3 F (36.8 C)  SpO2: 97%    Wt Readings from Last 3 Encounters:  11/05/19 (!) 406 lb (184.2 kg)  07/29/19 (!) 392 lb (177.8 kg)  07/19/19 (!) 399 lb 0.5 oz (181 kg)   Body mass index is 55.06 kg/m.  Physical Exam Vitals and nursing note reviewed.  Constitutional:      General: He is not in acute distress.    Appearance: He is well-developed.  HENT:     Head: Normocephalic and atraumatic.     Mouth/Throat:     Mouth: Mucous membranes are moist.     Pharynx: Oropharynx is clear.  Eyes:     Conjunctiva/sclera: Conjunctivae normal.     Pupils: Pupils are equal, round, and reactive to light.  Cardiovascular:     Rate and Rhythm: Normal rate and regular rhythm.     Pulses:          Dorsalis pedis pulses are 2+ on the right side and 2+ on the left side.     Heart sounds: No murmur heard.      Comments: LE lymphedema bilateral. Pitting LE edema 1+, bilateral. Pulmonary:     Effort: Pulmonary effort is normal. No respiratory distress.     Breath sounds: Normal breath sounds.  Abdominal:     Palpations: Abdomen is soft. There is no hepatomegaly or mass.     Tenderness: There is no abdominal tenderness.  Musculoskeletal:     Right lower leg: Edema present.  Lymphadenopathy:     Cervical: No cervical adenopathy.  Skin:    General: Skin is warm.     Findings: Rash is not pustular or vesicular.     Comments: Distal LE's with signs of stasis dermatitis. Crusty, non erythematous lesions. No tenderness or local heat. There is no exudate.  Neurological:     General: No focal deficit present.     Mental Status: He is alert and oriented to person,  place, and time.     Cranial Nerves: No cranial nerve deficit.     Gait: Gait normal.  Psychiatric:        Mood and Affect: Mood and affect normal.     Comments: Well groomed, good eye contact.    ASSESSMENT AND PLAN:   [name] was seen today for 4 months follow-up.  Orders Placed This Encounter  Procedures  . Hemoglobin A1c  . Lipid panel  . Microalbumin /  creatinine urine ratio  . BASIC METABOLIC PANEL WITH GFR  . Fructosamine  . Consult to Registered Dietitian   Lab Results  Component Value Date   CREATININE 0.84 11/05/2019   BUN 12 11/05/2019   NA 138 11/05/2019   K 4.1 11/05/2019   CL 102 11/05/2019   CO2 26 11/05/2019   Lab Results  Component Value Date   HGBA1C 6.6 (H) 11/05/2019   Lab Results  Component Value Date   CHOL 181 11/05/2019   HDL 46 11/05/2019   LDLCALC 116 (H) 11/05/2019   TRIG 93 11/05/2019   CHOLHDL 3.9 11/05/2019    Venous stasis dermatitis of both lower extremities Continue appropriate skin care. No changes in current management. Wt loss will help.  Morbid obesity with BMI of 50.0-59.9, adult (HCC) Steadily gaining wt. We discussed benefits of wt loss as well as adverse effects of obesity. Consistency with healthy diet and physical activity recommended. 10-15 min of daily walking or 10,000 steps daily recommended.   Hyperlipidemia associated with type 2 diabetes mellitus (HCC) He is not on statin, we discussed some CV benefits. Recommendations will be given according to FLP results.  Essential hypertension, benign Adequately controlled. No changes in current management. DASH diet recommended. Eye exam is current.    Type 2 diabetes mellitus with other specified complication (HCC) HgA1C has been at goal. He will benefit from GLP1 , she prefers to wait before adding new meds. No changes in current management.  Regular exercise and healthy diet with avoidance of added sugar food intake is an important part  of treatment and recommended. Annual eye exam, periodic dental and foot care recommended. F/U in 5-6 months  Bilateral pulmonary embolism (HCC) We discussed recommendations in regard oral anticoagulation. He is tolerating Xarelto well. Recommend continuing Xarelto 20 mg daily until last week of November/2021. We discussed some side effects.  Return in about 14 weeks (around 02/11/2020).  Ioma Chismar G. [place], MD  [facility]. [place] office.  A few things to remember from today's visit:  Diabetes mellitus type II, non insulin dependent (HCC) - Plan: Hemoglobin A1c, Microalbumin / creatinine urine ratio, BASIC METABOLIC PANEL WITH GFR, Fructosamine, Consult to Registered Dietitian  Essential hypertension, benign - Plan: BASIC METABOLIC PANEL WITH GFR  Bilateral pulmonary embolism (HCC)  Hyperlipidemia associated with type 2 diabetes mellitus (HCC) - Plan: Lipid panel  Ozempic or Victoza are good options that can also help with wt loss. Replace chocolate muffin for eggs x 2 and a piece of toast for example. 15 min of daily walking, 10,000 steps daily.  Will continue Xarelto until late November early December. If you need refills please call your pharmacy. Do not use My Chart to request refills or for acute issues that need immediate attention.    Please be sure medication list is accurate. If a new problem present, please set up appointment sooner than planned today.

## 2019-11-05 NOTE — Patient Instructions (Addendum)
A few things to remember from today's visit:  Diabetes mellitus type II, non insulin dependent (HCC) - Plan: Hemoglobin A1c, Microalbumin / creatinine urine ratio, BASIC METABOLIC PANEL WITH GFR, Fructosamine, Consult to Registered Dietitian  Essential hypertension, benign - Plan: BASIC METABOLIC PANEL WITH GFR  Bilateral pulmonary embolism (HCC)  Hyperlipidemia associated with type 2 diabetes mellitus (HCC) - Plan: Lipid panel  Ozempic or Victoza are good options that can also help with wt loss. Replace chocolate muffin for eggs x 2 and a piece of toast for example. 15 min of daily walking, 10,000 steps daily.  Will continue Xarelto until late November early December. If you need refills please call your pharmacy. Do not use My Chart to request refills or for acute issues that need immediate attention.    Please be sure medication list is accurate. If a new problem present, please set up appointment sooner than planned today.

## 2019-11-06 MED ORDER — RIVAROXABAN 20 MG PO TABS: 20.0000 mg | ORAL_TABLET | Freq: Every day | ORAL | 2 refills | Status: DC

## 2019-11-08 ENCOUNTER — Ambulatory Visit: Payer: Self-pay | Admitting: Family Medicine

## 2019-11-08 LAB — BASIC METABOLIC PANEL WITH GFR
BUN: 12 mg/dL (ref 7–25)
CO2: 26 mmol/L (ref 20–32)
Calcium: 9.2 mg/dL (ref 8.6–10.3)
Chloride: 102 mmol/L (ref 98–110)
Creat: 0.84 mg/dL (ref 0.70–1.33)
GFR, Est African American: 115 mL/min/{1.73_m2} (ref >=60)
GFR, Est Non African American: 99 mL/min/{1.73_m2} (ref >=60)
Glucose, Bld: 122 mg/dL — ABNORMAL HIGH (ref 65–99)
Potassium: 4.1 mmol/L (ref 3.5–5.3)
Sodium: 138 mmol/L (ref 135–146)

## 2019-11-08 LAB — HEMOGLOBIN A1C
Hgb A1c MFr Bld: 6.6 % of total Hgb — ABNORMAL HIGH (ref <5.7)
Mean Plasma Glucose: 143 (calc)
eAG (mmol/L): 7.9 (calc)

## 2019-11-08 LAB — FRUCTOSAMINE: Fructosamine: 271 umol/L (ref 205–285)

## 2019-11-08 LAB — MICROALBUMIN / CREATININE URINE RATIO
Creatinine, Urine: 92 mg/dL (ref 20–320)
Microalb Creat Ratio: 5 mcg/mg creat (ref <30)
Microalb, Ur: 0.5 mg/dL

## 2019-11-08 LAB — LIPID PANEL
Cholesterol: 181 mg/dL (ref <200)
HDL: 46 mg/dL (ref >=40)
LDL Cholesterol (Calc): 116 mg/dL (calc) — ABNORMAL HIGH
Non-HDL Cholesterol (Calc): 135 mg/dL (calc) — ABNORMAL HIGH (ref <130)
Total CHOL/HDL Ratio: 3.9 (calc) (ref <5.0)
Triglycerides: 93 mg/dL (ref <150)

## 2019-11-09 MED ORDER — HYDROCHLOROTHIAZIDE 25 MG PO TABS: 25.0000 mg | ORAL_TABLET | Freq: Every day | ORAL | 2 refills | Status: DC

## 2019-11-12 ENCOUNTER — Other Ambulatory Visit: Payer: Self-pay | Admitting: Family Medicine

## 2019-11-12 MED ORDER — ATORVASTATIN CALCIUM 20 MG PO TABS
20.0000 mg | ORAL_TABLET | Freq: Every day | ORAL | 3 refills | Status: DC
Start: 2019-11-12 — End: 2020-11-23

## 2019-12-26 ENCOUNTER — Telehealth: Payer: Self-pay | Admitting: Family Medicine

## 2019-12-26 NOTE — Telephone Encounter (Signed)
The patient called needing clarification on his Rx for rivaroxaban (XARELTO) 20 MG TABS tablet [[id number]]  He was supposed to get 30 tablets and only received 7 and got charged for 30. He was wondering if this was the end of this medication.but 7 pills is not going to get him to the end of November.

## 2019-12-27 NOTE — Telephone Encounter (Signed)
I spoke with pharmacy. Medication was filled as an alignment fill to get all his rx's on track to be picked up same day. Patient is aware.

## 2020-03-26 ENCOUNTER — Other Ambulatory Visit: Payer: Self-pay

## 2020-03-26 ENCOUNTER — Encounter: Payer: Self-pay | Admitting: Internal Medicine

## 2020-03-26 ENCOUNTER — Ambulatory Visit (HOSPITAL_COMMUNITY)
Admission: RE | Admit: 2020-03-26 | Discharge: 2020-03-26 | Disposition: A | Discharge status: Home / Self Care | Payer: 59 | Source: Ambulatory Visit | Attending: Internal Medicine | Admitting: Internal Medicine

## 2020-03-26 ENCOUNTER — Telehealth (INDEPENDENT_AMBULATORY_CARE_PROVIDER_SITE_OTHER): Payer: 59 | Admitting: Internal Medicine

## 2020-03-26 ENCOUNTER — Telehealth: Payer: Self-pay | Admitting: *Deleted

## 2020-03-26 VITALS — BP 152/91 | HR 65

## 2020-03-26 DIAGNOSIS — Z86711 Personal history of pulmonary embolism: Secondary | ICD-10-CM

## 2020-03-26 DIAGNOSIS — R0600 Dyspnea, unspecified: Secondary | ICD-10-CM

## 2020-03-26 DIAGNOSIS — Z86718 Personal history of other venous thrombosis and embolism: Secondary | ICD-10-CM | POA: Diagnosis present

## 2020-03-26 DIAGNOSIS — M79605 Pain in left leg: Secondary | ICD-10-CM

## 2020-03-26 DIAGNOSIS — R0609 Other forms of dyspnea: Secondary | ICD-10-CM

## 2020-03-26 MED ORDER — RIVAROXABAN 15 MG PO TABS: 15.0000 mg | ORAL_TABLET | Freq: Two times a day (BID) | ORAL | 0 refills | Status: DC

## 2020-03-26 NOTE — Telephone Encounter (Signed)
Spoke with the pt and informed him of the results.

## 2020-03-26 NOTE — Progress Notes (Signed)
Virtual Visit via Video Note  I connected with@ on 03/26/20 at  9:30 AM EST by a video enabled telemedicine application and verified that I am speaking with the correct person using two identifiers. Location patient: home Location provider: home office Persons participating in the virtual visit: patient, provider  [place]th national recommendations  regarding COVID 19 pandemic   video visit is advised over in office visit for this patient.  Patient aware  of the limitations of evaluation and management by telemedicine and  availability of in person appointments. and agreed to proceed.   HPI: Earl Donovan presents for video visit PCP appointment not available. Patient describes some question about shortness of breath or change in exercise tolerance over the last 2 to 3 days.  He is sensitive to this because he had a history of a major pulmonary embolus arising presumably from DVT left lower extremity in June 2021.  He was anticoagulated which was discontinued after 6 months in December. 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  1 night he had some shortness of breath but thought that might have been panic.  When he noticed left leg pain that was minor but more like a tender joint or tendon but not sure if important no injury no increase in swelling he has been out of his blood pressure medicine for a few days blood pressure had gone up to 152/92 usually is in the 130/80 or less range. He has no coughing fever chills or chest pain.  Yesterday his felt his breathing was somewhat better was able to do 3 laps around the workplace.  But feels uncomfortable and feels he needs assessment.  He was on rivaroxaban without side effects no excess bleeding.     ROS: See pertinent positives and negatives per HPI.  Past Medical History:   Diagnosis Date  . Arthritis    Lower back pain  . Gout   . Hypertension   . Morbid obesity with BMI of 50.0-59.9, adult (HCC)   . Pulmonary embolism (HCC)     History reviewed. No pertinent surgical history.  Family History  Problem Relation Age of Onset  . Arthritis Mother   . Cancer Mother   . Cancer Father   . Asthma Maternal Grandmother   . Asthma Maternal Grandfather   . Diabetes Daughter   . Clotting disorder Neg Hx     Social History   Tobacco Use  . Smoking status: Never Smoker  . Smokeless tobacco: Never Used  Substance Use Topics  . Alcohol use: Yes    Comment: occasional  . Drug use: No      Current Outpatient Medications:  .  allopurinol (ZYLOPRIM) 300 MG tablet, Take 1 tablet (300 mg total) by mouth daily., Disp: 90 tablet, Rfl: 2 .  atorvastatin (LIPITOR) 20 MG tablet, Take 1 tablet (20 mg total) by mouth daily., Disp: 90 tablet, Rfl: 3 .  hydrochlorothiazide (HYDRODIURIL) 25 MG tablet, Take 1 tablet (25 mg total) by mouth daily., Disp: 90 tablet, Rfl: 2 .  metFORMIN (GLUCOPHAGE) 500 MG tablet, Take 1 tablet (500 mg total) by mouth 2 (two) times daily with a meal., Disp: 180 tablet, Rfl: 2 .  Rivaroxaban (XARELTO) 15 MG TABS tablet, Take 1 tablet (15 mg total) by mouth 2 (two) times daily with  a meal. Initiating dose,further refills  After fu evaluation, Disp: 20 tablet, Rfl: 0 .  Triamcinolone Acetonide (TRIAMCINOLONE 0.1 % CREAM : EUCERIN) CREA, Apply 1 application topically 2 (two) times daily as needed for rash or itching., Disp: 500 each, Rfl: 1  EXAM: BP Readings from Last 3 Encounters:  03/26/20 (!) 152/91  11/05/19 126/80  07/29/19 118/78    VITALS per patient if applicable:  GENERAL: alert, oriented, appears well and in no acute distress speech is normal and no dyspnea at rest normal speech  HEENT: atraumatic, conjunttiva clear, no obvious abnormalities on inspection of external nose and ears  NECK: normal movements of the head and  neck  LUNGS: on inspection no signs of respiratory distress, breathing rate appears normal, no obvious gross SOB, gasping or wheezing  CV: no obvious cyanosis Cannot examine his legs  Today   PSYCH/NEURO: pleasant and cooperative, no obvious depression or anxiety, speech and thought processing grossly intact Lab Results  Component Value Date   WBC 7.7 07/22/2019   HGB 16.5 07/22/2019   HCT 47.9 07/22/2019   PLT 218 07/22/2019   GLUCOSE 122 (H) 11/05/2019   CHOL 181 11/05/2019   TRIG 93 11/05/2019   HDL 46 11/05/2019   LDLCALC 116 (H) 11/05/2019   ALT 18 07/19/2019   AST 32 07/19/2019   NA 138 11/05/2019   K 4.1 11/05/2019   CL 102 11/05/2019   CREATININE 0.84 11/05/2019   BUN 12 11/05/2019   CO2 26 11/05/2019   TSH 2.29 08/03/2016   PSA 2.68 08/03/2016   HGBA1C 6.6 (H) 11/05/2019   MICROALBUR 0.5 11/05/2019  Record reviewed. ASSESSMENT AND PLAN:  Discussed the following assessment and plan:    ICD-10-CM   1. Pain of left lower extremity  M79.605 VAS [place] LOWER EXTREMITY VENOUS (DVT)  2. Dyspnea on exertion  R06.00 VAS [place] LOWER EXTREMITY VENOUS (DVT)  3. Personal history of DVT (deep vein thrombosis)  Z86.718 VAS [place] LOWER EXTREMITY VENOUS (DVT)  4. History of pulmonary embolism  Z86.711 VAS [place] LOWER EXTREMITY VENOUS (DVT)  He has a number of underlying risk factors.  Has been off anticoagulants for less than 2 months.  Although symptoms are nowhere near when he was hospitalized he has some worried that something could be recurring as far as pulmonary embolus.  He is aware there are many other causes breathing difficulties does not have access to a pulse ox at this time.  Since this is a virtual visit it is reasonably safe to restart the rivaroxaban short-term order venous Doppler of his left lower extremity and make a follow-up visit with his primary doctor Dr. [place] either tomorrow or Monday after done.  He will continue on his blood pressure medicine.  If he is getting worse  over time or side effects of concern seek care in the emergency room. Counseled.   Expectant management and discussion of plan and treatment with opportunity to ask questions and all were answered. The patient agreed with the plan and demonstrated an understanding of the instructions.  total time review visit and planning  Fu 55 minutes  Advised to call back or seek an in-person evaluation if worsening  or having  further concerns . Return for fu appt pcp after doppler  to ed if worsening or alarm sx as discussed .    Berniece Andreas, MD

## 2020-03-26 NOTE — Telephone Encounter (Signed)
CRITICAL VALUE STICKER  CRITICAL VALUE: Negative DVT Negative superficial thrombosis - results will be placed in EPIC  RECEIVER (on-site recipient of call):Rebekha Diveley  DATE & TIME NOTIFIED: 03/26/20 1:40 pm  MESSENGER (representative from lab):Aundra Millet  MD NOTIFIED: Shirline Frees, NP  TIME OF NOTIFICATION:  RESPONSE: PCP not in office

## 2020-03-26 NOTE — Progress Notes (Signed)
Preliminary  shows  NO clot  in leg   tell patient and have him keep appt with PCP

## 2020-03-26 NOTE — Telephone Encounter (Signed)
Please let patient know that he does not have a DVT

## 2020-03-27 ENCOUNTER — Encounter: Payer: Self-pay | Admitting: Family Medicine

## 2020-03-27 ENCOUNTER — Ambulatory Visit: Payer: 59 | Admitting: Family Medicine

## 2020-03-27 VITALS — BP 136/80 | HR 73 | Resp 16 | Ht 72.0 in | Wt >= 6400 oz

## 2020-03-27 DIAGNOSIS — M109 Gout, unspecified: Secondary | ICD-10-CM | POA: Diagnosis not present

## 2020-03-27 DIAGNOSIS — E1169 Type 2 diabetes mellitus with other specified complication: Secondary | ICD-10-CM | POA: Diagnosis not present

## 2020-03-27 DIAGNOSIS — I10 Essential (primary) hypertension: Secondary | ICD-10-CM

## 2020-03-27 DIAGNOSIS — R0602 Shortness of breath: Secondary | ICD-10-CM

## 2020-03-27 DIAGNOSIS — Z1159 Encounter for screening for other viral diseases: Secondary | ICD-10-CM

## 2020-03-27 DIAGNOSIS — I872 Venous insufficiency (chronic) (peripheral): Secondary | ICD-10-CM

## 2020-03-27 LAB — POCT GLYCOSYLATED HEMOGLOBIN (HGB A1C): HbA1c, POC (prediabetic range): 6.3 % (ref 5.7–6.4)

## 2020-03-27 MED ORDER — ALLOPURINOL 300 MG PO TABS: 300.0000 mg | ORAL_TABLET | Freq: Every day | ORAL | 3 refills | Status: DC

## 2020-03-27 NOTE — Progress Notes (Signed)
HPI: [name] is a 55 y.o. male, who is here today to follow on recent visit. Virtual visit on 03/26/2020, when he was c/o about LLE pain and SOB.   Yesterday he had LE [place]: Negative for DVT.  Symptoms have resolved. In retrospective he thinks he was having knee pain and the SOB was because he did "overdo it." LE edema and stasis dermatitis are stable. Resumed HCTZ 25 mg bid. Wearing compression stocking.  He has been more sedentary for the past few months.  He started walking 2 days ago and feeling better. He is planning on changing her dietary habits and keeping up with regular physical activity.  DM II:Dx'ed 10/2017. He is not taking Metformin daily, 5 times per months. Negative for polydipsia,polyuria, or polyphagia. He is not checking BS's.  Lab Results  Component Value Date   HGBA1C 6.6 (H) 11/05/2019   Lab Results  Component Value Date   MICROALBUR 0.5 11/05/2019   MICROALBUR <0.7 04/30/2018   Gout arthritis: He has not had a gout attack in years. He is on Allopurinol 300 mg daily.  Lab Results  Component Value Date   LABURIC 4.6 04/30/2018    HTN: He is on Benicar 20 mg and HCTZ 25 mg bid. Negative for severe/frequent headache, visual changes, chest pain,palpitation, claudication, or focal weakness.  Lab Results  Component Value Date   CREATININE 0.84 11/05/2019   BUN 12 11/05/2019   NA 138 11/05/2019   K 4.1 11/05/2019   CL 102 11/05/2019   CO2 26 11/05/2019   Review of Systems  Constitutional: Negative for activity change, appetite change, fatigue and fever.  HENT: Negative for nosebleeds and sore throat.   Respiratory: Negative for cough and wheezing.   Gastrointestinal: Negative for abdominal pain, nausea and vomiting.  Genitourinary: Negative for decreased urine volume and hematuria.  Neurological: Negative for syncope, facial asymmetry and weakness.  Rest see pertinent positives and negatives per HPI.  Current Outpatient  Medications on File Prior to Visit  Medication Sig Dispense Refill  . atorvastatin (LIPITOR) 20 MG tablet Take 1 tablet (20 mg total) by mouth daily. 90 tablet 3  . hydrochlorothiazide (HYDRODIURIL) 25 MG tablet Take 1 tablet (25 mg total) by mouth daily. 90 tablet 2  . olmesartan (BENICAR) 20 MG tablet Take 20 mg by mouth daily.    . Triamcinolone Acetonide (TRIAMCINOLONE 0.1 % CREAM : EUCERIN) CREA Apply 1 application topically 2 (two) times daily as needed for rash or itching. 500 each 1   No current facility-administered medications on file prior to visit.     Past Medical History:  Diagnosis Date  . Arthritis    Lower back pain  . Gout   . Hypertension   . Morbid obesity with BMI of 50.0-59.9, adult (HCC)   . Pulmonary embolism (HCC)    No Known Allergies  Social History   Socioeconomic History  . Marital status: Married    Spouse name: Not on file  . Number of children: Not on file  . Years of education: Not on file  . Highest education level: Not on file  Occupational History  . Not on file  Tobacco Use  . Smoking status: Never Smoker  . Smokeless tobacco: Never Used  Substance and Sexual Activity  . Alcohol use: Yes    Comment: occasional  . Drug use: No  . Sexual activity: Yes  Other Topics Concern  . Not on file  Social History Narrative  . Not  on file   Social Determinants of Health   Financial Resource Strain: Not on file  Food Insecurity: Not on file  Transportation Needs: Not on file  Physical Activity: Not on file  Stress: Not on file  Social Connections: Not on file   Vitals:   03/27/20 1528  BP: 136/80  Pulse: 73  Resp: 16  SpO2: 96%   Wt Readings from Last 3 Encounters:  03/27/20 (!) 412 lb 2 oz (186.9 kg)  11/05/19 (!) 406 lb (184.2 kg)  07/29/19 (!) 392 lb (177.8 kg)   Body mass index is 55.89 kg/m.  Physical Exam Vitals and nursing note reviewed.  Constitutional:      General: He is not in acute distress.    Appearance: He  is well-developed.  HENT:     Head: Normocephalic and atraumatic.     Mouth/Throat:     Mouth: Mucous membranes are moist.     Pharynx: Oropharynx is clear.  Eyes:     Conjunctiva/sclera: Conjunctivae normal.  Cardiovascular:     Rate and Rhythm: Normal rate and regular rhythm.     Pulses:          Dorsalis pedis pulses are 2+ on the right side and 2+ on the left side.     Heart sounds: No murmur heard.     Comments: LE with compression stocking on. Pulmonary:     Effort: Pulmonary effort is normal. No respiratory distress.     Breath sounds: Normal breath sounds.  Abdominal:     Palpations: Abdomen is soft. There is no hepatomegaly or mass.     Tenderness: There is no abdominal tenderness.  Musculoskeletal:     Right lower leg: 2+ Pitting Edema present.     Left lower leg: 2+ Pitting Edema present.  Lymphadenopathy:     Cervical: No cervical adenopathy.  Skin:    General: Skin is warm.     Findings: No erythema or rash.  Neurological:     Mental Status: He is alert and oriented to person, place, and time.     Cranial Nerves: No cranial nerve deficit.     Gait: Gait normal.  Psychiatric:     Comments: Well groomed, good eye contact.    ASSESSMENT AND PLAN:  [name] was seen today for follow-up.  Diagnoses and all orders for this visit:  Orders Placed This Encounter  Procedures  . Uric acid  . Hepatitis C antibody  . Basic metabolic panel  . POC HgB A1c   Lab Results  Component Value Date   HGBA1C 6.3 03/27/2020   Lab Results  Component Value Date   CREATININE 0.83 03/27/2020   BUN 12 03/27/2020   NA 135 03/27/2020   K 4.0 03/27/2020   CL 98 03/27/2020   CO2 24 03/27/2020   Lab Results  Component Value Date   LABURIC 6.4 03/27/2020   Shortness of breath ? Deconditioning. Resolved. Instructed about warning signs.  Type 2 diabetes mellitus with other specified complication (HCC) HgA1C is at goal. He is not taking Metformin consistently, so I  think he can try non pharmacologic treatment. Regular exercise and healthy diet with avoidance of added sugar food intake encouraged. Annual eye exam, periodic dental and foot care. F/U in 3-4 months   Gout, arthropathy Problem is well controlled. Continue avoiding foods that can trigger symptoms. No changes in Allopurinol dose.   Essential hypertension, benign BP adequately controlled. Continue Benicar 20 mg daily and HCTZ 25 mg bid. Low  sat diet to continue.  Venous stasis dermatitis of both lower extremities Stable. Continue HCTZ 25 mg bid and compression stocking. Topical triamcinolone cream to apply,small amount,and bid prn.  Encounter for HCV screening test for low risk patient -     Hepatitis C antibody  Return in about 3 months (around 07/08/2020).  Betty G. [place], MD  [facility] [place] office.    A few things to remember from today's visit:   Encounter for HCV screening test for low risk patient - Plan: Hepatitis C antibody  Essential hypertension, benign - Plan: Basic metabolic panel  Type 2 diabetes mellitus with other specified complication, without long-term current use of insulin (HCC)  Gout, arthropathy - Plan: Uric acid  If you need refills please call your pharmacy. Do not use My Chart to request refills or for acute issues that need immediate attention.   Let's try without Metformin.  HgA1C goal < 7.0. Avoid sugar added food:regular soft drinks, energy drinks, and sports drinks. candy. cakes. cookies. pies and cobblers. sweet rolls, pastries, and donuts. fruit drinks, such as fruitades and fruit punch. dairy desserts, such as ice cream  Mediterranean diet has showed benefits for sugar control.  How much and what type of carbohydrate foods are important for managing diabetes. The balance between how much insulin is in your body and the carbohydrate you eat makes a difference in your blood glucose levels.  Fasting blood sugar  ideally 130 or less, 2 hours after meals less than 180.   Regular exercise also will help with controlling disease, daily brisk walking as tolerated for 15-30 min definitively will help.   Avoid skipping meals, blood sugar might drop and cause serious problems. Remember checking feet periodically, good dental hygiene, and annual eye exam.    Please be sure medication list is accurate. If a new problem present, please set up appointment sooner than planned today.

## 2020-03-27 NOTE — Assessment & Plan Note (Signed)
BP adequately controlled. Continue Benicar 20 mg daily and HCTZ 25 mg bid. Low sat diet to continue.

## 2020-03-27 NOTE — Patient Instructions (Addendum)
A few things to remember from today's visit:   Encounter for HCV screening test for low risk patient - Plan: Hepatitis C antibody  Essential hypertension, benign - Plan: Basic metabolic panel  Type 2 diabetes mellitus with other specified complication, without long-term current use of insulin (HCC)  Gout, arthropathy - Plan: Uric acid  If you need refills please call your pharmacy. Do not use My Chart to request refills or for acute issues that need immediate attention.   Let's try without Metformin.  HgA1C goal < 7.0. Avoid sugar added food:regular soft drinks, energy drinks, and sports drinks. candy. cakes. cookies. pies and cobblers. sweet rolls, pastries, and donuts. fruit drinks, such as fruitades and fruit punch. dairy desserts, such as ice cream  Mediterranean diet has showed benefits for sugar control.  How much and what type of carbohydrate foods are important for managing diabetes. The balance between how much insulin is in your body and the carbohydrate you eat makes a difference in your blood glucose levels.  Fasting blood sugar ideally 130 or less, 2 hours after meals less than 180.   Regular exercise also will help with controlling disease, daily brisk walking as tolerated for 15-30 min definitively will help.   Avoid skipping meals, blood sugar might drop and cause serious problems. Remember checking feet periodically, good dental hygiene, and annual eye exam.    Please be sure medication list is accurate. If a new problem present, please set up appointment sooner than planned today.

## 2020-03-27 NOTE — Assessment & Plan Note (Signed)
Stable. Continue HCTZ 25 mg bid and compression stocking. Topical triamcinolone cream to apply,small amount,and bid prn.

## 2020-03-27 NOTE — Assessment & Plan Note (Signed)
Problem is well controlled. Continue avoiding foods that can trigger symptoms. No changes in Allopurinol dose.

## 2020-03-27 NOTE — Assessment & Plan Note (Signed)
HgA1C is at goal. He is not taking Metformin consistently, so I think he can try non pharmacologic treatment. Regular exercise and healthy diet with avoidance of added sugar food intake encouraged. Annual eye exam, periodic dental and foot care. F/U in 3-4 months

## 2020-03-30 LAB — BASIC METABOLIC PANEL
BUN: 12 mg/dL (ref 7–25)
CO2: 24 mmol/L (ref 20–32)
Calcium: 9.6 mg/dL (ref 8.6–10.3)
Chloride: 98 mmol/L (ref 98–110)
Creat: 0.83 mg/dL (ref 0.70–1.33)
Glucose, Bld: 121 mg/dL — ABNORMAL HIGH (ref 65–99)
Potassium: 4 mmol/L (ref 3.5–5.3)
Sodium: 135 mmol/L (ref 135–146)

## 2020-03-30 LAB — HEPATITIS C ANTIBODY
Hepatitis C Ab: NONREACTIVE
SIGNAL TO CUT-OFF: 0.02 (ref <1.00)

## 2020-03-30 LAB — URIC ACID: Uric Acid, Serum: 6.4 mg/dL (ref 4.0–8.0)

## 2020-05-21 ENCOUNTER — Other Ambulatory Visit: Payer: Self-pay | Admitting: Family Medicine

## 2020-05-21 DIAGNOSIS — I10 Essential (primary) hypertension: Secondary | ICD-10-CM

## 2020-07-23 ENCOUNTER — Other Ambulatory Visit: Payer: Self-pay

## 2020-07-24 ENCOUNTER — Encounter: Payer: Self-pay | Admitting: Family Medicine

## 2020-07-24 ENCOUNTER — Ambulatory Visit: Payer: 59 | Admitting: Family Medicine

## 2020-07-24 VITALS — BP 128/80 | HR 96 | Resp 16 | Ht 72.0 in | Wt >= 6400 oz

## 2020-07-24 DIAGNOSIS — I872 Venous insufficiency (chronic) (peripheral): Secondary | ICD-10-CM

## 2020-07-24 DIAGNOSIS — Z6841 Body Mass Index (BMI) 40.0 and over, adult: Secondary | ICD-10-CM

## 2020-07-24 DIAGNOSIS — E785 Hyperlipidemia, unspecified: Secondary | ICD-10-CM

## 2020-07-24 DIAGNOSIS — R7989 Other specified abnormal findings of blood chemistry: Secondary | ICD-10-CM

## 2020-07-24 DIAGNOSIS — L97911 Non-pressure chronic ulcer of unspecified part of right lower leg limited to breakdown of skin: Secondary | ICD-10-CM

## 2020-07-24 DIAGNOSIS — E1169 Type 2 diabetes mellitus with other specified complication: Secondary | ICD-10-CM | POA: Diagnosis not present

## 2020-07-24 DIAGNOSIS — R0602 Shortness of breath: Secondary | ICD-10-CM | POA: Diagnosis not present

## 2020-07-24 DIAGNOSIS — I10 Essential (primary) hypertension: Secondary | ICD-10-CM

## 2020-07-24 LAB — POCT GLYCOSYLATED HEMOGLOBIN (HGB A1C): HbA1c, POC (controlled diabetic range): 6.8 % (ref 0.0–7.0)

## 2020-07-24 MED ORDER — OZEMPIC (0.25 OR 0.5 MG/DOSE) 2 MG/1.5ML ~~LOC~~ SOPN: 1.0000 mg | PEN_INJECTOR | SUBCUTANEOUS | 2 refills | Status: DC

## 2020-07-24 MED ORDER — DOXYCYCLINE HYCLATE 100 MG PO TABS: 100.0000 mg | ORAL_TABLET | Freq: Two times a day (BID) | ORAL | 0 refills | Status: DC

## 2020-07-24 NOTE — Assessment & Plan Note (Signed)
Problem otherwise stable, small wound caused by trauma. Continue compression stocking,appreopriate skin care,LE elevation,and HTCZ 25 mg qd.

## 2020-07-24 NOTE — Patient Instructions (Signed)
A few things to remember from today's visit:   Type 2 diabetes mellitus with other specified complication, without long-term current use of insulin (HCC) - Plan: POC HgB A1c, Basic metabolic panel, Semaglutide,0.25 or 0.5MG /DOS, (OZEMPIC, 0.25 OR 0.5 MG/DOSE,) 2 MG/1.5ML SOPN  SOB (shortness of breath) - Plan: D-dimer, Quantitative, EKG 12-Lead  Bilateral pulmonary embolism (HCC), Chronic  Morbid obesity with BMI of 50.0-59.9, adult (HCC), Chronic  Venous stasis dermatitis of both lower extremities  Ulcer of right lower extremity, limited to breakdown of skin (HCC) - Plan: doxycycline (VIBRA-TABS) 100 MG tablet  Hyperlipidemia associated with type 2 diabetes mellitus (HCC) - Plan: Lipid panel  Essential hypertension, benign - Plan: Basic metabolic panel  If you need refills please call your pharmacy. Do not use My Chart to request refills or for acute issues that need immediate attention.   Ozempic to start 0.25 mg weekly, increase dose as tolerated (double it) every 2-3 weeks to 1 mg weekly. No changes in rest of your meds.  Keep wound clean with soap and water.   Please be sure medication list is accurate. If a new problem present, please set up appointment sooner than planned today.

## 2020-07-24 NOTE — Progress Notes (Signed)
Chief Complaint  Patient presents with   Follow-up   HPI: [name] is a 55 y.o. male, who is here today for 4 months follow up.   Earl Donovan was last seen on 03/27/2020. Hypertension: Currently Earl Donovan is on olmesartan 20 mg daily and HCTZ 25 mg daily. Son BP at home has been elevated, "up and down."  BP 181/121. 155/101 last night. 96/66 1-2 weeks ago.  Negative for severe/frequent headache, visual changes,claudication like symptoms, focal weakness, or worsening edema.  Lab Results  Component Value Date   CREATININE 0.83 03/27/2020   BUN 12 03/27/2020   NA 135 03/27/2020   K 4.0 03/27/2020   CL 98 03/27/2020   CO2 24 03/27/2020   HLD: Earl Donovan is on atorvastatin 20 mg daily. Aortic atherosclerosis noted she has been seen in imaging in the past.  Lab Results  Component Value Date   CHOL 181 11/05/2019   HDL 46 11/05/2019   LDLCALC 116 (H) 11/05/2019   TRIG 93 11/05/2019   CHOLHDL 3.9 11/05/2019   Earl Donovan is not exercising regularly. Earl Donovan is trying to follow a healthful diet. Earl Donovan has lost some wt.  3 weeks of SOB when doing some chores at home: Carrying laundry and walking around his house. Occasionally associated with palpitations. Negative for CP and diaphoresis. Symptoms last a few minutes, alleviated by resting. Today mild nonproductive cough. No associated wheezing. OSA wearing his CPAP, Earl Donovan does not think Earl Donovan is cleaning it as Earl Donovan is supposed to.  Earl Donovan is concerned about possibility of another PE. Hospitalized for PE in 07/2019. Earl Donovan completed 3 to 4 months of oral anticoagulation. No recent surgery or long travel.  1. Bilateral central and segmental pulmonary emboli, right worse than left. No CT evidence of right heart strain. 2. Mild T10 superior endplate compression deformity, age indeterminate.  Aortic Atherosclerosis (ICD10-I70.0).  Vein disease with venous stasis dermatitis: LE edema, stable. Injured RLE 4 weeks ago, has a small wound,healing slowly, clear  exudate that has improved. Negative for fever, chills, change in appetite. Earl Donovan is keeping the area covered. Earl Donovan applies triamcinolone daily as needed.  Review of Systems  Constitutional:  Negative for activity change and appetite change.  HENT:  Negative for nosebleeds, sore throat and trouble swallowing.   Gastrointestinal:  Negative for abdominal pain, nausea and vomiting.  Endocrine: Negative for cold intolerance and heat intolerance.  Genitourinary:  Negative for decreased urine volume, dysuria and hematuria.  Musculoskeletal:  Negative for gait problem and myalgias.  Skin:  Positive for wound. Negative for pallor.  Neurological:  Negative for syncope and facial asymmetry.  Rest of ROS, see pertinent positives sand negatives in HPI  Current Outpatient Medications on File Prior to Visit  Medication Sig Dispense Refill   allopurinol (ZYLOPRIM) 300 MG tablet Take 1 tablet (300 mg total) by mouth daily. 90 tablet 3   atorvastatin (LIPITOR) 20 MG tablet Take 1 tablet (20 mg total) by mouth daily. 90 tablet 3   hydrochlorothiazide (HYDRODIURIL) 25 MG tablet Take 1 tablet (25 mg total) by mouth daily. 90 tablet 2   olmesartan (BENICAR) 20 MG tablet TAKE 1 TABLET BY MOUTH EVERY DAY (MAIN. RETAIL FILLS EXCEEDED) 30 tablet 8   Triamcinolone Acetonide (TRIAMCINOLONE 0.1 % CREAM : EUCERIN) CREA Apply 1 application topically 2 (two) times daily as needed for rash or itching. 500 each 1   No current facility-administered medications on file prior to visit.     Past Medical History:  Diagnosis Date  Arthritis    Lower back pain   Gout    Hypertension    Morbid obesity with BMI of 50.0-59.9, adult (HCC)    Pulmonary embolism (HCC)    No Known Allergies  Social History   Socioeconomic History   Marital status: Married    Spouse name: Not on file   Number of children: Not on file   Years of education: Not on file   Highest education level: Not on file  Occupational History   Not on  file  Tobacco Use   Smoking status: Never   Smokeless tobacco: Never  Substance and Sexual Activity   Alcohol use: Yes    Comment: occasional   Drug use: No   Sexual activity: Yes  Other Topics Concern   Not on file  Social History Narrative   Not on file   Social Determinants of Health   Financial Resource Strain: Not on file  Food Insecurity: Not on file  Transportation Needs: Not on file  Physical Activity: Not on file  Stress: Not on file  Social Connections: Not on file    Vitals:   07/24/20 1052  BP: 128/80  Pulse: 96  Resp: 16  SpO2: 97%   Wt Readings from Last 3 Encounters:  07/24/20 (!) 407 lb (184.6 kg)  03/27/20 (!) 412 lb 2 oz (186.9 kg)  11/05/19 (!) 406 lb (184.2 kg)   Body mass index is 55.2 kg/m.  Physical Exam Vitals and nursing note reviewed.  Constitutional:      General: Earl Donovan is not in acute distress.    Appearance: Earl Donovan is well-developed.  HENT:     Head: Normocephalic and atraumatic.     Mouth/Throat:     Mouth: Mucous membranes are moist.     Pharynx: Oropharynx is clear.  Eyes:     Conjunctiva/sclera: Conjunctivae normal.  Cardiovascular:     Rate and Rhythm: Normal rate and regular rhythm.     Pulses:          Dorsalis pedis pulses are 2+ on the right side and 2+ on the left side.     Heart sounds: No murmur heard. Pulmonary:     Effort: Pulmonary effort is normal. No respiratory distress.     Breath sounds: Normal breath sounds.  Abdominal:     Palpations: Abdomen is soft. There is no hepatomegaly or mass.     Tenderness: There is no abdominal tenderness.  Lymphadenopathy:     Cervical: No cervical adenopathy.  Skin:    General: Skin is warm.     Findings: No erythema or rash.          Comments: Riangle shape would lateral right calf, max diameter 3-4 cm. Granulation tissue, no erythema, clear exudate.  Neurological:     General: No focal deficit present.     Mental Status: Earl Donovan is alert and oriented to person, place, and  time.     Cranial Nerves: No cranial nerve deficit.     Gait: Gait normal.  Psychiatric:     Comments: Well groomed, good eye contact.   ASSESSMENT AND PLAN:   Earl Donovan was seen today for 4 months follow-up.  Orders Placed This Encounter  Procedures   Basic metabolic panel   Lipid panel   D-dimer, Quantitative   POC HgB A1c   EKG 12-Lead   Lab Results  Component Value Date   DDIMER 3.86 (H) 07/24/2020   Lab Results  Component Value Date  HGBA1C 6.8 07/24/2020    SOB (shortness of breath) We discussed possible etiologies. Wells score 4.5, moderate probability. ? Deconditioning. EKG today: SR,normal axis, 1rt degree AV block; Some artifact present on some leads, otherwise normal. Compared with EKG done on 6/ Earl Donovan is concerned about possible PE. D-dimer ordered today STAT. Clearly instructed about warning signs.  Morbid obesity with BMI of 50.0-59.9, adult (HCC) We discussed benefits of wt loss as well as adverse effects of obesity. Consistency with healthy diet and physical activity recommended. Weight Watchers is a good option as well as daily brisk walking for 15-30 min as tolerated.  Ulcer of right lower extremity, limited to breakdown of skin (HCC) Slowly healing. Recommend course of Doxycycline. Ins to keep wound clean with soap and water. Keep it uncovered for a few hours and covered if there is a risk of contamination when working outdoors. Instructed about warning signs.  -     doxycycline (VIBRA-TABS) 100 MG tablet; Take 1 tablet (100 mg total) by mouth 2 (two) times daily for 7 days.  Hyperlipidemia associated with type 2 diabetes mellitus (HCC) Continue Atorvastatin 20 mg daily. Low fat diet also recommended.   Essential hypertension, benign BP adequately controlled today. Re-checked 135/75 LUE. Continue HCTZ 25 mg daily and Olmesartan 20 mg daily. Low salt diet. Continue monitoring BP regularly.  Venous stasis dermatitis of both  lower extremities Problem otherwise stable, small wound caused by trauma. Continue compression stocking,appreopriate skin care,LE elevation,and HTCZ 25 mg qd.  Type 2 diabetes mellitus with other specified complication (HCC) HgA1C went up slightly , still at goal. Earl Donovan agrees with trying Ozempic, starting 0.25 mg weekly and titrating up to 1 mg weekly as tolerated. Annual eye exam, periodic dental and foot care recommended. F/U in 4 months  Spent 41 minutes.  During this time history was obtained and documented, examination was performed, prior labs/imaging reviewed, and assessment/plan discussed.  Earl Donovan understands D-dimer can be falsely due to other problem different to PE or DVT but if any elevation Earl Donovan will still need to go to the ER to have a chest CTA. At 5:01 PM D-dimer is not back yet.  Return in about 4 months (around 11/23/2020).   Earl Viramontes G. [place], MD  [facility]. [place] office.   A few things to remember from today's visit:   Type 2 diabetes mellitus with other specified complication, without long-term current use of insulin (HCC) - Plan: POC HgB A1c, Basic metabolic panel, Semaglutide,0.25 or 0.5MG /DOS, (OZEMPIC, 0.25 OR 0.5 MG/DOSE,) 2 MG/1.5ML SOPN  SOB (shortness of breath) - Plan: D-dimer, Quantitative, EKG 12-Lead  Bilateral pulmonary embolism (HCC), Chronic  Morbid obesity with BMI of 50.0-59.9, adult (HCC), Chronic  Venous stasis dermatitis of both lower extremities  Ulcer of right lower extremity, limited to breakdown of skin (HCC) - Plan: doxycycline (VIBRA-TABS) 100 MG tablet  Hyperlipidemia associated with type 2 diabetes mellitus (HCC) - Plan: Lipid panel  Essential hypertension, benign - Plan: Basic metabolic panel  If you need refills please call your pharmacy. Do not use My Chart to request refills or for acute issues that need immediate attention.   Ozempic to start 0.25 mg weekly, increase dose as tolerated (double it) every 2-3 weeks  to 1 mg weekly. No changes in rest of your meds.  Keep wound clean with soap and water.   Please be sure medication list is accurate. If a new problem present, please set up appointment sooner than planned today.

## 2020-07-24 NOTE — Assessment & Plan Note (Signed)
HgA1C went up slightly , still at goal. He agrees with trying Ozempic, starting 0.25 mg weekly and titrating up to 1 mg weekly as tolerated. Annual eye exam, periodic dental and foot care recommended. F/U in 4 months

## 2020-07-25 LAB — D-DIMER, QUANTITATIVE: D-Dimer, Quant: 3.86 mcg/mL FEU — ABNORMAL HIGH (ref <0.50)

## 2020-07-27 ENCOUNTER — Observation Stay (HOSPITAL_BASED_OUTPATIENT_CLINIC_OR_DEPARTMENT_OTHER): Payer: 59

## 2020-07-27 ENCOUNTER — Other Ambulatory Visit: Payer: Self-pay

## 2020-07-27 ENCOUNTER — Encounter (HOSPITAL_COMMUNITY): Payer: Self-pay | Admitting: Internal Medicine

## 2020-07-27 ENCOUNTER — Observation Stay (HOSPITAL_COMMUNITY)
Admission: EM | Admit: 2020-07-27 | Discharge: 2020-07-29 | Disposition: A | Discharge status: Home / Self Care | Payer: 59 | Attending: Internal Medicine | Admitting: Internal Medicine

## 2020-07-27 ENCOUNTER — Emergency Department (HOSPITAL_COMMUNITY): Payer: 59

## 2020-07-27 DIAGNOSIS — E119 Type 2 diabetes mellitus without complications: Secondary | ICD-10-CM | POA: Insufficient documentation

## 2020-07-27 DIAGNOSIS — G4733 Obstructive sleep apnea (adult) (pediatric): Secondary | ICD-10-CM

## 2020-07-27 DIAGNOSIS — I2609 Other pulmonary embolism with acute cor pulmonale: Secondary | ICD-10-CM

## 2020-07-27 DIAGNOSIS — R0600 Dyspnea, unspecified: Secondary | ICD-10-CM | POA: Diagnosis present

## 2020-07-27 DIAGNOSIS — Z7982 Long term (current) use of aspirin: Secondary | ICD-10-CM | POA: Insufficient documentation

## 2020-07-27 DIAGNOSIS — I872 Venous insufficiency (chronic) (peripheral): Secondary | ICD-10-CM | POA: Diagnosis present

## 2020-07-27 DIAGNOSIS — Z20822 Contact with and (suspected) exposure to covid-19: Secondary | ICD-10-CM | POA: Diagnosis not present

## 2020-07-27 DIAGNOSIS — I2602 Saddle embolus of pulmonary artery with acute cor pulmonale: Secondary | ICD-10-CM | POA: Diagnosis not present

## 2020-07-27 DIAGNOSIS — Z794 Long term (current) use of insulin: Secondary | ICD-10-CM | POA: Diagnosis not present

## 2020-07-27 DIAGNOSIS — I10 Essential (primary) hypertension: Secondary | ICD-10-CM | POA: Diagnosis not present

## 2020-07-27 DIAGNOSIS — Z79899 Other long term (current) drug therapy: Secondary | ICD-10-CM | POA: Diagnosis not present

## 2020-07-27 DIAGNOSIS — E785 Hyperlipidemia, unspecified: Secondary | ICD-10-CM | POA: Diagnosis present

## 2020-07-27 DIAGNOSIS — I2699 Other pulmonary embolism without acute cor pulmonale: Secondary | ICD-10-CM | POA: Diagnosis not present

## 2020-07-27 DIAGNOSIS — E1169 Type 2 diabetes mellitus with other specified complication: Secondary | ICD-10-CM | POA: Diagnosis present

## 2020-07-27 LAB — COMPREHENSIVE METABOLIC PANEL
ALT: 14 U/L (ref 0–44)
AST: 26 U/L (ref 15–41)
Albumin: 3.5 g/dL (ref 3.5–5.0)
Alkaline Phosphatase: 55 U/L (ref 38–126)
Anion gap: 6 (ref 5–15)
BUN: 12 mg/dL (ref 6–20)
CO2: 23 mmol/L (ref 22–32)
Calcium: 8.8 mg/dL — ABNORMAL LOW (ref 8.9–10.3)
Chloride: 104 mmol/L (ref 98–111)
Creatinine, Ser: 0.85 mg/dL (ref 0.61–1.24)
GFR, Estimated: >60 mL/min (ref >60)
Glucose, Bld: 162 mg/dL — ABNORMAL HIGH (ref 70–99)
Potassium: 4.7 mmol/L (ref 3.5–5.1)
Sodium: 133 mmol/L — ABNORMAL LOW (ref 135–145)
Total Bilirubin: 1.1 mg/dL (ref 0.3–1.2)
Total Protein: 8 g/dL (ref 6.5–8.1)

## 2020-07-27 LAB — SARS CORONAVIRUS 2 (TAT 6-24 HRS): SARS Coronavirus 2: NEGATIVE

## 2020-07-27 LAB — CBC
HCT: 45.7 % (ref 39.0–52.0)
Hemoglobin: 15.6 g/dL (ref 13.0–17.0)
MCH: 30.5 pg (ref 26.0–34.0)
MCHC: 34.1 g/dL (ref 30.0–36.0)
MCV: 89.4 fL (ref 80.0–100.0)
Platelets: 230 10*3/uL (ref 150–400)
RBC: 5.11 MIL/uL (ref 4.22–5.81)
RDW: 13.1 % (ref 11.5–15.5)
WBC: 9.1 10*3/uL (ref 4.0–10.5)
nRBC: 0 % (ref 0.0–0.2)

## 2020-07-27 LAB — ECHOCARDIOGRAM LIMITED

## 2020-07-27 MED ORDER — HYDROCODONE-ACETAMINOPHEN 5-325 MG PO TABS: 1.0000 | ORAL_TABLET | ORAL | Status: DC | PRN

## 2020-07-27 MED ORDER — IOHEXOL 350 MG/ML SOLN
50.0000 mL | Freq: Once | INTRAVENOUS | Status: AC | PRN
  Administered 2020-07-27: 50 mL via INTRAVENOUS

## 2020-07-27 MED ORDER — HYDRALAZINE HCL 20 MG/ML IJ SOLN: 5.0000 mg | INTRAMUSCULAR | Status: DC | PRN

## 2020-07-27 MED ORDER — ACETAMINOPHEN 325 MG PO TABS: 650.0000 mg | ORAL_TABLET | Freq: Four times a day (QID) | ORAL | Status: DC | PRN

## 2020-07-27 MED ORDER — IRBESARTAN 150 MG PO TABS
150.0000 mg | ORAL_TABLET | Freq: Every day | ORAL | Status: DC
  Administered 2020-07-28 – 2020-07-29 (×2): 150 mg via ORAL
  Filled 2020-07-27 (×2): qty 1

## 2020-07-27 MED ORDER — HYDROCHLOROTHIAZIDE 25 MG PO TABS
25.0000 mg | ORAL_TABLET | Freq: Every day | ORAL | Status: DC
  Administered 2020-07-28 – 2020-07-29 (×2): 25 mg via ORAL
  Filled 2020-07-27 (×2): qty 1

## 2020-07-27 MED ORDER — PERFLUTREN LIPID MICROSPHERE
1.0000 mL | INTRAVENOUS | Status: AC | PRN
  Administered 2020-07-27: 2 mL via INTRAVENOUS
  Filled 2020-07-27: qty 10

## 2020-07-27 MED ORDER — HEPARIN (PORCINE) 25000 UT/250ML-% IV SOLN
2600.0000 [IU]/h | INTRAVENOUS | Status: DC
  Administered 2020-07-27: 2200 [IU]/h via INTRAVENOUS
  Administered 2020-07-28: 2600 [IU]/h via INTRAVENOUS
  Administered 2020-07-28: 2200 [IU]/h via INTRAVENOUS
  Filled 2020-07-27 (×3): qty 250

## 2020-07-27 MED ORDER — HEPARIN BOLUS VIA INFUSION
7500.0000 [IU] | Freq: Once | INTRAVENOUS | Status: AC
  Administered 2020-07-27: 7500 [IU] via INTRAVENOUS
  Filled 2020-07-27: qty 7500

## 2020-07-27 MED ORDER — BISACODYL 5 MG PO TBEC
5.0000 mg | DELAYED_RELEASE_TABLET | Freq: Every day | ORAL | Status: DC | PRN
  Administered 2020-07-28: 5 mg via ORAL
  Filled 2020-07-27: qty 1

## 2020-07-27 MED ORDER — MORPHINE SULFATE (PF) 2 MG/ML IV SOLN: 2.0000 mg | INTRAVENOUS | Status: DC | PRN

## 2020-07-27 MED ORDER — TRIAMCINOLONE 0.1 % CREAM:EUCERIN CREAM 1:1
TOPICAL_CREAM | Freq: Two times a day (BID) | CUTANEOUS | Status: DC
  Filled 2020-07-27 (×3): qty 1

## 2020-07-27 MED ORDER — ACETAMINOPHEN 650 MG RE SUPP: 650.0000 mg | Freq: Four times a day (QID) | RECTAL | Status: DC | PRN

## 2020-07-27 MED ORDER — ALLOPURINOL 300 MG PO TABS
300.0000 mg | ORAL_TABLET | Freq: Every day | ORAL | Status: DC
  Administered 2020-07-27 – 2020-07-29 (×3): 300 mg via ORAL
  Filled 2020-07-27: qty 3
  Filled 2020-07-27: qty 1
  Filled 2020-07-27: qty 3

## 2020-07-27 MED ORDER — ATORVASTATIN CALCIUM 10 MG PO TABS
20.0000 mg | ORAL_TABLET | Freq: Every day | ORAL | Status: DC
  Administered 2020-07-27 – 2020-07-29 (×3): 20 mg via ORAL
  Filled 2020-07-27 (×3): qty 2

## 2020-07-27 MED ORDER — DOCUSATE SODIUM 100 MG PO CAPS
100.0000 mg | ORAL_CAPSULE | Freq: Two times a day (BID) | ORAL | Status: DC
  Administered 2020-07-28: 100 mg via ORAL
  Filled 2020-07-27 (×3): qty 1

## 2020-07-27 MED ORDER — ONDANSETRON HCL 4 MG/2ML IJ SOLN: 4.0000 mg | Freq: Four times a day (QID) | INTRAMUSCULAR | Status: DC | PRN

## 2020-07-27 MED ORDER — SODIUM CHLORIDE 0.9% FLUSH
3.0000 mL | Freq: Two times a day (BID) | INTRAVENOUS | Status: DC
  Administered 2020-07-27 – 2020-07-29 (×4): 3 mL via INTRAVENOUS

## 2020-07-27 MED ORDER — ALBUTEROL SULFATE (2.5 MG/3ML) 0.083% IN NEBU: 2.5000 mg | INHALATION_SOLUTION | RESPIRATORY_TRACT | Status: DC | PRN

## 2020-07-27 MED ORDER — ONDANSETRON HCL 4 MG PO TABS: 4.0000 mg | ORAL_TABLET | Freq: Four times a day (QID) | ORAL | Status: DC | PRN

## 2020-07-27 MED ORDER — POLYETHYLENE GLYCOL 3350 17 G PO PACK: 17.0000 g | PACK | Freq: Every day | ORAL | Status: DC | PRN

## 2020-07-27 NOTE — ED Notes (Signed)
This RN attempted IV placement x2, called for [place] placement from 2nd RN.

## 2020-07-27 NOTE — ED Notes (Signed)
Patient transported to CT 

## 2020-07-27 NOTE — ED Triage Notes (Signed)
Pt reports some shob over the last week, so went to PCP. D dimer results came back elevated and today has worsening shob. Sent here for further eval.

## 2020-07-27 NOTE — ED Provider Notes (Signed)
[facility] EMERGENCY DEPARTMENT Provider Note   CSN: [id number] Arrival date & time: 07/27/20  9935     History Chief Complaint  Patient presents with   Shortness of Breath   Abnormal Lab    Earl Donovan is a 55 y.o. male.  HPI He is here for evaluation of dyspnea on exertion, worsening over the last several days.  He saw his PCP, 3 days ago, at that time laboratory testing was done.  Testing returned showing elevated D-dimer.  He contacted his doctor today and she suggested he come here for further evaluation and disposition.  Patient has had a cough occasionally which is nonproductive.  He denies fever, chills, nausea, vomiting, focal weakness paresthesia.  No near-syncope or syncope.  History of agreeableness, not clear what caused it, he has been off anticoagulants for greater than 6 months.  There are no other known active modifying factors.    Past Medical History:  Diagnosis Date   Arthritis    Lower back pain   Gout    Hypertension    Morbid obesity with BMI of 50.0-59.9, adult ([place])    Pulmonary embolism ([place])     Patient Active Problem List   Diagnosis Date Noted   PE (pulmonary thromboembolism) ([place]) 07/27/2020   Bilateral pulmonary embolism ([place]) 07/20/2019   Type 2 diabetes mellitus with other specified complication ([place]) 70/17/7939   Hyperlipidemia associated with type 2 diabetes mellitus ([place]) 07/31/2015   Gout, arthropathy 07/31/2015   Morbid obesity with BMI of 50.0-59.9, adult ([place]) 07/31/2015   Essential hypertension, benign 07/31/2015   Venous stasis dermatitis of both lower extremities 07/31/2015   OSA on CPAP 07/31/2015   Spinal stenosis of lumbar region 07/23/2012   Numbness 07/23/2012   Back pain, chronic 07/23/2012    No past surgical history on file.     Family History  Problem Relation Age of Onset   Arthritis Mother    Cancer Mother    Cancer Father    Asthma Maternal Grandmother    Asthma Maternal  Grandfather    Diabetes Daughter    Clotting disorder Neg Hx     Social History   Tobacco Use   Smoking status: Never   Smokeless tobacco: Never  Substance Use Topics   Alcohol use: Yes    Comment: occasional   Drug use: No    Home Medications Prior to Admission medications   Medication Sig Start Date End Date Taking? Authorizing Provider  allopurinol (ZYLOPRIM) 300 MG tablet Take 1 tablet (300 mg total) by mouth daily. 03/27/20  Yes [place], Betty G, MD  aspirin EC 81 MG tablet Take 81 mg by mouth daily. Swallow whole.   Yes [provider]  atorvastatin (LIPITOR) 20 MG tablet Take 1 tablet (20 mg total) by mouth daily. 11/12/19  Yes [place], Betty G, MD  doxycycline (VIBRA-TABS) 100 MG tablet Take 1 tablet (100 mg total) by mouth 2 (two) times daily for 7 days. 07/24/20 07/31/20 Yes [place], Betty G, MD  hydrochlorothiazide (HYDRODIURIL) 25 MG tablet Take 1 tablet (25 mg total) by mouth daily. 11/09/19  Yes [place], Betty G, MD  olmesartan (BENICAR) 20 MG tablet TAKE 1 TABLET BY MOUTH EVERY DAY (MAIN. RETAIL FILLS EXCEEDED) Patient taking differently: Take 20 mg by mouth daily. 05/22/20  Yes [place], Betty G, MD  Triamcinolone Acetonide (TRIAMCINOLONE 0.1 % CREAM : EUCERIN) CREA Apply 1 application topically 2 (two) times daily as needed for rash or itching. 11/05/19  Yes [place],  Malka So, MD  Semaglutide,0.25 or 0.5MG/DOS, (OZEMPIC, 0.25 OR 0.5 MG/DOSE,) 2 MG/1.5ML SOPN Inject 1 mg into the skin once a week. 07/24/20   [place], Betty G, MD    Allergies    Patient has no known allergies.  Review of Systems   Review of Systems  All other systems reviewed and are negative.  Physical Exam Updated Vital Signs BP (!) 143/80   Pulse 67   Temp 98.3 F (36.8 C) (Oral)   Resp (!) 21   SpO2 95%   Physical Exam Vitals and nursing note reviewed.  Constitutional:      General: He is not in acute distress.    Appearance: He is well-developed. He is obese. He is not ill-appearing or  diaphoretic.  HENT:     Head: Normocephalic and atraumatic.     Right Ear: External ear normal.     Left Ear: External ear normal.  Eyes:     Conjunctiva/sclera: Conjunctivae normal.     Pupils: Pupils are equal, round, and reactive to light.  Neck:     Trachea: Phonation normal.  Cardiovascular:     Rate and Rhythm: Normal rate and regular rhythm.     Heart sounds: Normal heart sounds.  Pulmonary:     Effort: Pulmonary effort is normal. No respiratory distress.     Breath sounds: Normal breath sounds. No stridor.  Abdominal:     Palpations: Abdomen is soft.     Tenderness: There is no abdominal tenderness.  Musculoskeletal:        General: Swelling present. Normal range of motion.     Cervical back: Normal range of motion and neck supple.     Right lower leg: Edema present.     Left lower leg: Edema present.  Skin:    General: Skin is warm and dry.     Comments: He has a bandage on sore on the right mid lower leg.  Neurological:     Mental Status: He is alert and oriented to person, place, and time.     Cranial Nerves: No cranial nerve deficit.     Sensory: No sensory deficit.     Motor: No abnormal muscle tone.     Coordination: Coordination normal.  Psychiatric:        Mood and Affect: Mood normal.        Behavior: Behavior normal.        Thought Content: Thought content normal.        Judgment: Judgment normal.    ED Results / Procedures / Treatments   Labs (all labs ordered are listed, but only abnormal results are displayed) Labs Reviewed  COMPREHENSIVE METABOLIC PANEL - Abnormal; Notable for the following components:      Result Value   Sodium 133 (*)    Glucose, Bld 162 (*)    Calcium 8.8 (*)    All other components within normal limits  SARS CORONAVIRUS 2 (TAT 6-24 HRS)  CBC    EKG EKG Interpretation  Date/Time:  Monday July 27 2020 09:35:47 EDT Ventricular Rate:  76 PR Interval:  210 QRS Duration: 96 QT Interval:  386 QTC Calculation: 434 R  Axis:   35 Text Interpretation: Sinus rhythm with 1st degree A-V block Cannot rule out Anterior infarct , age undetermined Abnormal ECG since last tracing no significant change Confirmed by Daleen Bo [phone]) on 07/27/2020 3:39:57 PM  Radiology DG Chest 2 View  Result Date: 07/27/2020 CLINICAL DATA:  Shortness of breath and chest  pain for EXAM: CHEST - 2 VIEW COMPARISON:  07/19/2019 FINDINGS: Cardiac shadow is stable. The lungs are well aerated bilaterally. No focal infiltrate or effusion is seen. No bony abnormality is noted. IMPRESSION: No acute abnormality noted. Electronically Signed   By: Inez Catalina M.D.   On: 07/27/2020 10:13   CT Angio Chest PE W/Cm &/Or Wo Cm  Result Date: 07/27/2020 CLINICAL DATA:  Shortness of breath and elevated D-dimer EXAM: CT ANGIOGRAPHY CHEST WITH CONTRAST TECHNIQUE: Multidetector CT imaging of the chest was performed using the standard protocol during bolus administration of intravenous contrast. Multiplanar CT image reconstructions and MIPs were obtained to evaluate the vascular anatomy. CONTRAST:  61m OMNIPAQUE IOHEXOL 350 MG/ML SOLN COMPARISON:  Chest radiograph July 27, 2020; chest CT July 19, 2020 FINDINGS: Cardiovascular: There are multiple pulmonary emboli arising from the main right pulmonary artery extending into multiple right lower lobe pulmonary arteries as well as pulmonary emboli in multiple left lower lobe pulmonary arteries. Previous pulmonary embolus in the posterior segment of the left upper lobe pulmonary artery is not present. The right ventricle to left ventricle diameter ratio is 1.2, evident of right heart strain. There is no thoracic aortic aneurysm or dissection. Visualized great vessels appear unremarkable. There is aortic atherosclerosis. There are occasional foci of coronary artery calcification. Mediastinum/Nodes: Visualized thyroid appears unremarkable. There is moderate mediastinal fat. No evident thoracic adenopathy. No esophageal  lesions. Lungs/Pleura: There is a suspected pulmonary infarct in the posterior right base. There are areas of mild atelectatic change bilaterally. There is mosaic attenuation in each upper lobe posteriorly. No appreciable pleural effusions. Trachea and major bronchial structures appear patent. No pneumothorax. Upper Abdomen: Visualized upper abdominal structures appear normal. Musculoskeletal: No blastic or lytic bone lesions. No chest wall lesions. Review of the MIP images confirms the above findings. IMPRESSION: 1. Multiple pulmonary emboli in a distribution similar to prior study. It is difficult to ascertain how much of the burden of pulmonary embolus currently is acute versus chronic. Acute pulmonary embolus must be of concern given the appearance of the current pulmonary emboli. Specifically, these pulmonary emboli show diffuse decreased attenuation suggesting acute pulmonary embolus. There is no pulmonary arterial vessels sclerosis or narrowing. Note that there is right heart strain. Positive for acute PE with CT evidence of right heart strain (RV/LV Ratio = 1.2) consistent with at least submassive (intermediate risk) PE. The presence of right heart strain has been associated with an increased risk of morbidity and mortality. Please refer to the "PE Focused" order set in EPIC. 2. No thoracic aortic aneurysm or dissection. There is aortic atherosclerosis and occasional foci coronary artery calcification. 3. Suspect pulmonary infarct posterior left base. There may be a small associated focus of atelectasis/pneumonia in this area. 4. Areas of mosaic attenuation in the upper lobes which likely represents underlying small airways obstructive disease. 5.  No evident thoracic adenopathy. Aortic Atherosclerosis (ICD10-I70.0). Critical Value/emergent results were called by telephone at the time of interpretation on 07/27/2020 at 3:10 pm [facility] verbally acknowledged these results. Electronically  Signed   By: WLowella GripIII M.D.   On: 07/27/2020 15:10    Procedures Procedures   Medications Ordered in ED Medications  iohexol (OMNIPAQUE) 350 MG/ML injection 50 mL (50 mLs Intravenous Contrast Given 07/27/20 1453)    ED Course  I have reviewed the triage vital signs and the nursing notes.  Pertinent labs & imaging results that were available during my care of the patient were  reviewed by me and considered in my medical decision making (see chart for details).    MDM Rules/Calculators/A&P                           Patient Vitals for the past 24 hrs:  BP Temp Temp src Pulse Resp SpO2  07/27/20 1500 (!) 143/80 -- -- 67 (!) 21 95 %  07/27/20 1430 (!) 165/91 -- -- 70 (!) 21 96 %  07/27/20 1400 118/86 -- -- 67 17 98 %  07/27/20 1315 133/78 -- -- 60 17 98 %  07/27/20 1300 124/65 -- -- (!) 57 18 97 %  07/27/20 1245 119/67 -- -- (!) 56 16 98 %  07/27/20 1230 136/77 -- -- 60 19 97 %  07/27/20 1215 115/75 -- -- 64 (!) 25 96 %  07/27/20 1200 136/77 -- -- (!) 58 17 97 %  07/27/20 1145 (!) 150/80 -- -- 73 16 97 %  07/27/20 1130 (!) 142/79 -- -- 64 14 97 %  07/27/20 1103 128/68 98.3 F (36.8 C) Oral 70 18 97 %  07/27/20 0932 130/69 98.4 F (36.9 C) Oral 73 19 96 %    3:50 PM reevaluation with update and discussion. After initial assessment and treatment, an updated evaluation reveals no change in clinical status, findings discussed with the patient and all questions answered. Daleen Bo   Medical Decision Making:  This patient is presenting for evaluation of dyspnea on exertion, which does require a range of treatment options, and is a complaint that involves a moderate risk of morbidity and mortality. The differential diagnoses include pulmonary embolus, congestive heart failure, bronchitis. I decided to review old records, and in summary middle-aged male presenting with dyspnea and positive D-dimer.  He has a history of PE, no cardiac history.  Not currently showing signs  for acute infection.  He has a history of sleep apnea, treated with CPAP..  I did not require additional historical information from anyone.  Clinical Laboratory Tests Ordered, included CBC, c-Met, COVID test. Review indicates initial findings normal except sodium low, glucose high, calcium low. Radiologic Tests Ordered, included CT angiogram to rule out PE.  I independently Visualized: Radiograph images, which show CT angio chest indicates bilateral PE, with right heart strain, possible left-sided pulmonary infarct   Critical Interventions-clinical evaluation, laboratory testing, radiography, observation reassessment  After These Interventions, the Patient was reevaluated and was found with recurrent PEs, similar distribution to prior.  He has right heart strain, and will need to be hospitalized.  He does not require advanced airway at this time.  Oxygenation normal on room air.  Respiratory rate slightly elevated.  CRITICAL CARE-no Performed by: Daleen Bo  Nursing Notes Reviewed/ Care Coordinated Applicable Imaging Reviewed Interpretation of Laboratory Data incorporated into ED treatment   Discussed with hospitalist who will admit the patient.    Final Clinical Impression(s) / ED Diagnoses Final diagnoses:  Other acute pulmonary embolism with acute cor pulmonale ([place])    Rx / DC Orders ED Discharge Orders     None        Daleen Bo, MD 07/27/20 1612

## 2020-07-27 NOTE — Progress Notes (Signed)
ANTICOAGULATION CONSULT NOTE - Initial Consult  Pharmacy Consult for heparin Indication: pulmonary embolus  No Known Allergies  Patient Measurements:   Heparin Dosing Weight: 123kg  Vital Signs: Temp: 98.3 F (36.8 C) (06/13 1103) Temp Source: Oral (06/13 1103) BP: 143/80 (06/13 1500) Pulse Rate: 67 (06/13 1500)  Labs: Recent Labs    07/27/20 0937  HGB 15.6  HCT 45.7  PLT 230  CREATININE 0.85    Estimated Creatinine Clearance: 167.2 mL/min (by C-G formula based on SCr of 0.85 mg/dL).   Medical History: Past Medical History:  Diagnosis Date   Arthritis    Lower back pain   Gout    Hypertension    Morbid obesity with BMI of 50.0-59.9, adult ([facility])    Pulmonary embolism ([facility])     Assessment: 55 YOM presenting with SOB x 7d, elevated D-dimer.  Hx of PE, not currently on anticoagulation PTA, CBC wnl.  CT with chronic + acute PE, evidence of RHS.    Goal of Therapy:  Heparin level 0.3-0.7 units/ml Monitor platelets by anticoagulation protocol: Yes   Plan:  Heparin 7500 units IV x 1, and gtt at 2200 units/hr F/u 6 hour heparin level Monitor long term AC plan and ability to transition to PO  Daylene Posey, PharmD Clinical Pharmacist ED Pharmacist Phone # [phone] 07/27/2020 4:13 PM

## 2020-07-27 NOTE — H&P (Addendum)
History and Physical    Earl Donovan WYO:[id number] DOB: [birth year] DOA: 07/27/2020  PCP: [place], Betty G, MD Consultants:  Leone Payor- GI Patient coming from: Home - lives with wife; NOK: Denton Lank Herley Bernardini, [phone]  Chief Complaint: SOB  HPI: Earl Donovan is a 55 y.o. male with medical history significant of HTN; PE; DM; and morbid obesity presenting with SOB.  Last week, he noticed mild SOB when carrying things.  He scheduled an appt with his PCP and saw her Friday.  Dr. Swaziland ordered a D-dimer and it was elevated.  She was going to schedule a CT for today but he got SOB again yesterday and felt bloated.  She encouraged him to come to the ER.  He was on blood thinners for about 6 months prior and then was taken off.    ED Course: Recurrent PE, mild R heart strain.  Not year started on Lovenox-heparin.  Treated previously with 6 months of therapy.  Review of Systems: As per HPI; otherwise review of systems reviewed and negative.   Ambulatory Status:  Ambulates without assistance  COVID Vaccine Status:   Complete  Past Medical History:  Diagnosis Date   Arthritis    Lower back pain   Gout    Hypertension    Morbid obesity with BMI of 50.0-59.9, adult (HCC)    Pulmonary embolism (HCC)     History reviewed. No pertinent surgical history.  Social History   Socioeconomic History   Marital status: Married    Spouse name: Not on file   Number of children: Not on file   Years of education: Not on file   Highest education level: Not on file  Occupational History   Occupation: Engineer, water  Tobacco Use   Smoking status: Never   Smokeless tobacco: Never  Substance and Sexual Activity   Alcohol use: Yes    Comment: occasional   Drug use: No   Sexual activity: Yes  Other Topics Concern   Not on file  Social History Narrative   Not on file   Social Determinants of Health   Financial Resource Strain: Not on file  Food Insecurity: Not on file   Transportation Needs: Not on file  Physical Activity: Not on file  Stress: Not on file  Social Connections: Not on file  Intimate Partner Violence: Not on file    No Known Allergies  Family History  Problem Relation Age of Onset   Arthritis Mother    Cancer Mother    Cancer Father    Asthma Maternal Grandmother    Asthma Maternal Grandfather    Diabetes Daughter    Clotting disorder Neg Hx     Prior to Admission medications   Medication Sig Start Date End Date Taking? Authorizing Provider  allopurinol (ZYLOPRIM) 300 MG tablet Take 1 tablet (300 mg total) by mouth daily. 03/27/20  Yes [place], Betty G, MD  aspirin EC 81 MG tablet Take 81 mg by mouth daily. Swallow whole.   Yes [provider]  atorvastatin (LIPITOR) 20 MG tablet Take 1 tablet (20 mg total) by mouth daily. 11/12/19  Yes [place], Betty G, MD  doxycycline (VIBRA-TABS) 100 MG tablet Take 1 tablet (100 mg total) by mouth 2 (two) times daily for 7 days. 07/24/20 07/31/20 Yes [place], Betty G, MD  hydrochlorothiazide (HYDRODIURIL) 25 MG tablet Take 1 tablet (25 mg total) by mouth daily. 11/09/19  Yes [place], Betty G, MD  olmesartan (BENICAR) 20 MG tablet TAKE 1 TABLET BY  MOUTH EVERY DAY (MAIN. RETAIL FILLS EXCEEDED) Patient taking differently: Take 20 mg by mouth daily. 05/22/20  Yes [place]Jordan, Betty G, MD  Triamcinolone Acetonide (TRIAMCINOLONE 0.1 % CREAM : EUCERIN) CREA Apply 1 application topically 2 (two) times daily as needed for rash or itching. 11/05/19  Yes [place]Jordan, Betty G, MD  Semaglutide,0.25 or 0.5MG /DOS, (OZEMPIC, 0.25 OR 0.5 MG/DOSE,) 2 MG/1.5ML SOPN Inject 1 mg into the skin once a week. 07/24/20   [place]andJordan, Betty G, MD    Physical Exam: Vitals:   07/27/20 1400 07/27/20 1430 07/27/20 1500 07/27/20 1600  BP: 118/86 (!) 165/91 (!) 143/80 125/69  Pulse: 67 70 67 68  Resp: 17 (!) 21 (!) 21 (!) 21  Temp:      TempSrc:      SpO2: 98% 96% 95% 98%     General:  Appears calm and comfortable and is in NAD, on  RA Eyes:  PERRL, EOMI, normal lids, iris ENT:  grossly normal hearing, lips & tongue, mmm; appropriate dentition Neck:  no LAD, masses or thyromegaly Cardiovascular:  RRR, no m/r/g.  Respiratory:   CTA bilaterally with no wheezes/rales/rhonchi.  Normal to mildly increased respiratory effort. Abdomen:  soft, NT, ND, obese Skin:  B LE stasis dermatitis without obvious cellulitis; compression stockings in place; +small healing ulcer on lateral R lower leg without surrounding erythema or purulence.  RLE may be slightly larger than L. Musculoskeletal:  grossly normal tone BUE/BLE, good ROM, no bony abnormality Psychiatric:  grossly normal mood and affect, speech fluent and appropriate, AOx3 Neurologic:  CN 2-12 grossly intact, moves all extremities in coordinated fashion    Radiological Exams on Admission: Independently reviewed - see discussion in A/P where applicable  DG Chest 2 View  Result Date: 07/27/2020 CLINICAL DATA:  Shortness of breath and chest pain for EXAM: CHEST - 2 VIEW COMPARISON:  07/19/2019 FINDINGS: Cardiac shadow is stable. The lungs are well aerated bilaterally. No focal infiltrate or effusion is seen. No bony abnormality is noted. IMPRESSION: No acute abnormality noted. Electronically Signed   By: Alcide CleverMark  Lukens M.D.   On: 07/27/2020 10:13   CT Angio Chest PE W/Cm &/Or Wo Cm  Result Date: 07/27/2020 CLINICAL DATA:  Shortness of breath and elevated D-dimer EXAM: CT ANGIOGRAPHY CHEST WITH CONTRAST TECHNIQUE: Multidetector CT imaging of the chest was performed using the standard protocol during bolus administration of intravenous contrast. Multiplanar CT image reconstructions and MIPs were obtained to evaluate the vascular anatomy. CONTRAST:  50mL OMNIPAQUE IOHEXOL 350 MG/ML SOLN COMPARISON:  Chest radiograph July 27, 2020; chest CT July 19, 2020 FINDINGS: Cardiovascular: There are multiple pulmonary emboli arising from the main right pulmonary artery extending into multiple right  lower lobe pulmonary arteries as well as pulmonary emboli in multiple left lower lobe pulmonary arteries. Previous pulmonary embolus in the posterior segment of the left upper lobe pulmonary artery is not present. The right ventricle to left ventricle diameter ratio is 1.2, evident of right heart strain. There is no thoracic aortic aneurysm or dissection. Visualized great vessels appear unremarkable. There is aortic atherosclerosis. There are occasional foci of coronary artery calcification. Mediastinum/Nodes: Visualized thyroid appears unremarkable. There is moderate mediastinal fat. No evident thoracic adenopathy. No esophageal lesions. Lungs/Pleura: There is a suspected pulmonary infarct in the posterior right base. There are areas of mild atelectatic change bilaterally. There is mosaic attenuation in each upper lobe posteriorly. No appreciable pleural effusions. Trachea and major bronchial structures appear patent. No pneumothorax. Upper Abdomen: Visualized upper abdominal structures  appear normal. Musculoskeletal: No blastic or lytic bone lesions. No chest wall lesions. Review of the MIP images confirms the above findings. IMPRESSION: 1. Multiple pulmonary emboli in a distribution similar to prior study. It is difficult to ascertain how much of the burden of pulmonary embolus currently is acute versus chronic. Acute pulmonary embolus must be of concern given the appearance of the current pulmonary emboli. Specifically, these pulmonary emboli show diffuse decreased attenuation suggesting acute pulmonary embolus. There is no pulmonary arterial vessels sclerosis or narrowing. Note that there is right heart strain. Positive for acute PE with CT evidence of right heart strain (RV/LV Ratio = 1.2) consistent with at least submassive (intermediate risk) PE. The presence of right heart strain has been associated with an increased risk of morbidity and mortality. Please refer to the "PE Focused" order set in EPIC. 2. No  thoracic aortic aneurysm or dissection. There is aortic atherosclerosis and occasional foci coronary artery calcification. 3. Suspect pulmonary infarct posterior left base. There may be a small associated focus of atelectasis/pneumonia in this area. 4. Areas of mosaic attenuation in the upper lobes which likely represents underlying small airways obstructive disease. 5.  No evident thoracic adenopathy. Aortic Atherosclerosis (ICD10-I70.0). Critical Value/emergent results were called by telephone at the time of interpretation on 07/27/2020 at 3:10 pm to provider [facility] , who verbally acknowledged these results. Electronically Signed   By: Bretta Bang III M.D.   On: 07/27/2020 15:10    EKG: Independently reviewed.  NSR with rate 76; no evidence of acute ischemia   Labs on Admission: I have personally reviewed the available labs and imaging studies at the time of the admission.  Pertinent labs:   Glucose 162 Normal CBC D-dimer 3.86 on 6/10   Assessment/Plan Principal Problem:   PE (pulmonary thromboembolism) (HCC) Active Problems:   Hyperlipidemia associated with type 2 diabetes mellitus (HCC)   Morbid obesity with BMI of 50.0-59.9, adult (HCC)   Venous stasis dermatitis of both lower extremities   OSA on CPAP   Type 2 diabetes mellitus with other specified complication [facility])    PE -Patient with prior episode of thromboembolic disease in 07/2019 presenting with new recurrent PE -He is not currently requiring O2 -R heart strain seen on CT; stat echo is pending. -Will observe on telemetry -Initiate anticoagulation - for now, will start treatment-dose Heparin drip -He is likely able to transition to alternative [facility] agent tomorrow -DOAC is most convenient option; will request TOC team consult to see which is most affordable for him -Coumadin is another option, since it is very inexpensive; however, it requires frequent physician visits and is much less convenient -This clot  likely occurred due to his sedentary lifestyle and he has been encouraged to increase his exercise; however, at this time he will need lifelong anticoagulation regardless -The patient understands that thromboembolic disease can be catastrophic and even deadly and that he must be complaint with physician appointments and anticoagulation.   DM -Recent A1c was 6.8, indicating reasonable control -hold Glucophage -Cover with moderate-scale SSI   HTN -Continue HCTZ, Benicar (Avapro formulary substitution)   HLD -Continue Lipitor   OSA on CPAP -Continue CPAP   Morbid obesity Body mass index is 54.12 kg/m.  -Weight loss should be encouraged -Outpatient PCP/bariatric medicine/bariatric surgery f/u encouraged -He has been prescribed Ozempic but has not started taking this yet   Stasis dermatitis -Continue compression stockings -Will add Eucerin:TAC for now -No current evidence of cellulitis  Note: This patient has been tested and is negative for the novel coronavirus COVID-19. The patient has been fully vaccinated against COVID-19.   Level of care: Telemetry Medical DVT prophylaxis: Heparin Code Status:  Full - confirmed with patient Family Communication: None present Disposition Plan:  The patient is from: home  Anticipated d/c is to: home without [facility] services  Anticipated d/c date will depend on clinical response to treatment, but possibly as early as tomorrow if he has excellent response to treatment  Patient is currently: acutely ill Consults called: TOC team  Admission status:  It is my clinical opinion that referral for OBSERVATION is reasonable and necessary in this patient based on the above information provided. The aforementioned taken together are felt to place the patient at high risk for further clinical deterioration. However it is anticipated that the patient may be medically stable for discharge from the hospital within 24 to 48 hours.    Jonah Blue  MD [facility]   How to contact the [facility] Attending or Consulting provider 7A - 7P or covering provider during after hours 7P -7A, for this patient?  Check the care team in [facility] and look for a) attending/consulting TRH provider listed and b) the [facility] team listed Log into [email or web address] and use [place]'s universal password to access. If you do not have the password, please contact the hospital operator. Locate the [facility] provider you are looking for under [facility] and page to a number that you can be directly reached. If you still have difficulty reaching the provider, please page the [facility] (Director on Call) for the Hospitalists listed on amion for assistance.   07/27/2020, 5:30 PM

## 2020-07-28 ENCOUNTER — Observation Stay (HOSPITAL_BASED_OUTPATIENT_CLINIC_OR_DEPARTMENT_OTHER): Payer: 59

## 2020-07-28 DIAGNOSIS — I2699 Other pulmonary embolism without acute cor pulmonale: Secondary | ICD-10-CM

## 2020-07-28 DIAGNOSIS — Z6841 Body Mass Index (BMI) 40.0 and over, adult: Secondary | ICD-10-CM

## 2020-07-28 DIAGNOSIS — Z9989 Dependence on other enabling machines and devices: Secondary | ICD-10-CM

## 2020-07-28 DIAGNOSIS — E1169 Type 2 diabetes mellitus with other specified complication: Secondary | ICD-10-CM

## 2020-07-28 DIAGNOSIS — E785 Hyperlipidemia, unspecified: Secondary | ICD-10-CM

## 2020-07-28 DIAGNOSIS — G4733 Obstructive sleep apnea (adult) (pediatric): Secondary | ICD-10-CM | POA: Diagnosis not present

## 2020-07-28 LAB — GLUCOSE, CAPILLARY
Glucose-Capillary: 122 mg/dL — ABNORMAL HIGH (ref 70–99)
Glucose-Capillary: 107 mg/dL — ABNORMAL HIGH (ref 70–99)

## 2020-07-28 LAB — CBC
HCT: 45.3 % (ref 39.0–52.0)
Hemoglobin: 15.4 g/dL (ref 13.0–17.0)
MCH: 30.4 pg (ref 26.0–34.0)
MCHC: 34 g/dL (ref 30.0–36.0)
MCV: 89.5 fL (ref 80.0–100.0)
Platelets: 215 10*3/uL (ref 150–400)
RBC: 5.06 MIL/uL (ref 4.22–5.81)
RDW: 13.2 % (ref 11.5–15.5)
WBC: 10.2 10*3/uL (ref 4.0–10.5)
nRBC: 0 % (ref 0.0–0.2)

## 2020-07-28 LAB — BASIC METABOLIC PANEL
Anion gap: 9 (ref 5–15)
BUN: 11 mg/dL (ref 6–20)
CO2: 25 mmol/L (ref 22–32)
Calcium: 8.5 mg/dL — ABNORMAL LOW (ref 8.9–10.3)
Chloride: 99 mmol/L (ref 98–111)
Creatinine, Ser: 0.9 mg/dL (ref 0.61–1.24)
GFR, Estimated: >60 mL/min (ref >60)
Glucose, Bld: 125 mg/dL — ABNORMAL HIGH (ref 70–99)
Potassium: 4 mmol/L (ref 3.5–5.1)
Sodium: 133 mmol/L — ABNORMAL LOW (ref 135–145)

## 2020-07-28 LAB — HIV ANTIBODY (ROUTINE TESTING W REFLEX): HIV Screen 4th Generation wRfx: NONREACTIVE

## 2020-07-28 LAB — HEPARIN LEVEL (UNFRACTIONATED)
Heparin Unfractionated: 0.15 IU/mL — ABNORMAL LOW (ref 0.30–0.70)
Heparin Unfractionated: 0.46 IU/mL (ref 0.30–0.70)

## 2020-07-28 LAB — CBG MONITORING, ED: Glucose-Capillary: 131 mg/dL — ABNORMAL HIGH (ref 70–99)

## 2020-07-28 MED ORDER — APIXABAN 5 MG PO TABS: 5.0000 mg | ORAL_TABLET | Freq: Two times a day (BID) | ORAL | Status: DC

## 2020-07-28 MED ORDER — HEPARIN BOLUS VIA INFUSION
2000.0000 [IU] | Freq: Once | INTRAVENOUS | Status: AC
  Administered 2020-07-28: 2000 [IU] via INTRAVENOUS
  Filled 2020-07-28: qty 2000

## 2020-07-28 MED ORDER — APIXABAN 5 MG PO TABS
10.0000 mg | ORAL_TABLET | Freq: Two times a day (BID) | ORAL | Status: DC
  Administered 2020-07-28 – 2020-07-29 (×2): 10 mg via ORAL
  Filled 2020-07-28 (×3): qty 2

## 2020-07-28 MED ORDER — INSULIN ASPART 100 UNIT/ML IJ SOLN
0.0000 [IU] | Freq: Three times a day (TID) | INTRAMUSCULAR | Status: DC
  Administered 2020-07-28 – 2020-07-29 (×4): 2 [IU] via SUBCUTANEOUS

## 2020-07-28 NOTE — Progress Notes (Addendum)
ANTICOAGULATION CONSULT NOTE  Pharmacy Consult for heparin Indication: pulmonary embolus  Labs: Recent Labs    07/27/20 0937 07/28/20 0149 07/28/20 0241 07/28/20 0242 07/28/20 1020  HGB 15.6  --   --  15.4  --   HCT 45.7  --   --  45.3  --   PLT 230  --   --  215  --   HEPARINUNFRC  --  0.15*  --   --  0.46  CREATININE 0.85  --  0.90  --   --     Assessment: 55 yo male presented on 07/27/2020 with SOB. CTA obtained and found to have submassive PE (RV/LV ratio 1.2). Patient with a history of PE in June 2021 treated with rivaroxaban and rivaroxaban stopped around February 2022. Lower extremity doppler found age indeterminate DVT.   Heparin level 0.46 on heparin 2600 units/hr is therapeutic. No issues IV access or infusion. No bleeding noted.   Goal of Therapy:  Heparin level 0.3-0.7 units/ml Monitor platelets by anticoagulation protocol: Yes   Plan:  Continue heparin 2600 units/hr  Check 6 hr confirmatory heparin level Follow up oral AC plan - case management consult placed for apixaban cost  Monitor heparin level, CBC and s/s of bleeding daily  Gerrit Halls, PharmD Clinical Pharmacist   07/28/2020 1:23 PM

## 2020-07-28 NOTE — Plan of Care (Signed)

## 2020-07-28 NOTE — Progress Notes (Signed)
ANTICOAGULATION CONSULT NOTE  Pharmacy Consult for heparin Indication: pulmonary embolus  Labs: Recent Labs    07/27/20 0937 07/28/20 0149 07/28/20 0241 07/28/20 0242  HGB 15.6  --   --  15.4  HCT 45.7  --   --  45.3  PLT 230  --   --  215  HEPARINUNFRC  --  0.15*  --   --   CREATININE 0.85  --  0.90  --     Assessment: 55 YOM presenting with SOB x 7d, elevated D-dimer.  Hx of PE, not currently on anticoagulation PTA, CBC wnl.  CT with chronic + acute PE, evidence of RHS.   Initial heparin level 0.15 units/ml Goal of Therapy:  Heparin level 0.3-0.7 units/ml Monitor platelets by anticoagulation protocol: Yes   Plan:  Heparin 2000 units IV x 1, and gtt at 2600 units/hr F/u 6 hour heparin level Monitor long term AC plan and ability to transition to PO  Thanks for allowing pharmacy to be a part of this patient's care.  Talbert Cage, PharmD Clinical Pharmacist  07/28/2020 4:00 AM

## 2020-07-28 NOTE — ED Notes (Signed)
RN/Charge RN not available for report at this time. Request call back in 20 minutes.

## 2020-07-28 NOTE — Progress Notes (Signed)
ANTICOAGULATION CONSULT NOTE  Pharmacy Consult for heparin >> Apixaban Indication: pulmonary embolus  Labs: Recent Labs    07/27/20 0937 07/28/20 0149 07/28/20 0241 07/28/20 0242 07/28/20 1020  HGB 15.6  --   --  15.4  --   HCT 45.7  --   --  45.3  --   PLT 230  --   --  215  --   HEPARINUNFRC  --  0.15*  --   --  0.46  CREATININE 0.85  --  0.90  --   --    Assessment: 55 yo male presented on 07/27/2020 with SOB. CTA obtained and found to have submassive PE (RV/LV ratio 1.2). Patient with a history of PE in June 2021 treated with rivaroxaban and rivaroxaban stopped around February 2022. Lower extremity doppler found age indeterminate DVT.   Consulted this PM to change from IV Heparin to Apixaban. BMI 55 - MD aware. CBC is within normal limits. LFTs normal. SCr 0.9 and no major drug interactions noted with current regimen.   Goal of Therapy:  Monitor platelets by anticoagulation protocol: Yes   Plan:  Apixaban 10mg  po BID x7 days then on Wednesday June 22 decrease to 5mg  po BID.  Discontinue IV heparin AT THE SAME TIME as the first dose of Apixaban is given.  Monitor CBC, renal and liver function, and for any signs or symptoms of bleeding.   June 24, PharmD, BCPS, BCCCP Clinical Pharmacist Please refer to [facility] for [facility] Pharmacy numbers 07/28/2020 4:07 PM

## 2020-07-28 NOTE — Progress Notes (Signed)
PROGRESS NOTE        PATIENT DETAILS Name: Earl Donovan Age: 55 y.o. Sex: male Date of Birth: April 25, 1965 Admit Date: 07/27/2020 Admitting Physician Jonah Blue, MD TGG:YIRSWN, Timoteo Expose, MD  Brief Narrative: Patient is a 55 y.o. male with history of VTE-off anticoagulation for around 6 months-presented with worsening shortness of breath-was found to have recurrent pulmonary embolism-was started on IV heparin and admitted to the hospitalist service.  Significant events: 6/13>> shortness of breath-admit for PE.  Significant studies: 6/13>> CTA chest: Multiple PE-submassive, CT evidence of RV strain. 6/13>> Echo: EF 60-65%, RV systolic function not well visualized.  Antimicrobial therapy: None  Microbiology data: 6/13>> COVID PCR: Negative  Procedures : None  Consults: None  DVT Prophylaxis :   IV heparin.   Subjective: Lying comfortably in bed-complains of a knot/some discomfort in the left posterior thigh.   Assessment/Plan: Submassive PE: Although has CT evidence of RV strain-no clinical evidence of RV strain-echo unfortunately was unable to evaluate RV systolic function.  He does acknowledge some discomfort/knot in the left posterior thigh-we will need to rule out a DVT as well.  Plan is to continue IV heparin-await lower extremity Dopplers-and assess if he can be transition to oral anticoagulant later today.  He wants to try Eliquis this time around-he previously was on Xarelto-and thinks it caused constipation.  Suspect needs to be monitored for 1 additional day before consideration of discharge.  Since this is his second episode of PE-will need indefinite anticoagulation.    DM-2: Continue to hold metformin-start SSI and follow CBGs.    HTN: BP controlled-continue HCTZ/ARB  HLD: Continue Lipitor  OSA: Continue CPAP  Venous stasis dermatitis: Continue compression stockings    Morbid Obesity: Estimated body mass index is  55.2 kg/m as calculated from the following:   Height as of this encounter: 6' (1.829 m).   Weight as of this encounter: 184.6 kg.    Diet: Diet Order             Diet Heart Room service appropriate? Yes; Fluid consistency: Thin  Diet effective now                    Code Status: Full code   Family Communication: None at bedside  Disposition Plan: Status is: Observation  The patient will require care spanning > 2 midnights and should be moved to inpatient because: Inpatient level of care appropriate due to severity of illness  Dispo: The patient is from: Home              Anticipated d/c is to: Home              Patient currently is not medically stable to d/c.   Difficult to place patient No    Barriers to Discharge: On IV heparin for submassive PE-awaiting lower extremity Dopplers to ensure he does not have a significant lower extremity DVT.  Needs to be mobilized.  Likely discharge on 6/15 once stable on oral anticoagulation  Antimicrobial agents: Anti-infectives (From admission, onward)    None        Time spent: 35. minutes-Greater than 50% of this time was spent in counseling, explanation of diagnosis, planning of further management, and coordination of care.  MEDICATIONS: Scheduled Meds:  allopurinol  300 mg Oral Daily   atorvastatin  20 mg Oral  Daily   docusate sodium  100 mg Oral BID   hydrochlorothiazide  25 mg Oral Daily   irbesartan  150 mg Oral Daily   sodium chloride flush  3 mL Intravenous Q12H   triamcinolone 0.1 % cream : eucerin   Topical BID   Continuous Infusions:  heparin 2,600 Units/hr (07/28/20 0410)   PRN Meds:.acetaminophen **OR** acetaminophen, albuterol, bisacodyl, hydrALAZINE, HYDROcodone-acetaminophen, morphine injection, ondansetron **OR** ondansetron (ZOFRAN) IV, polyethylene glycol   PHYSICAL EXAM: Vital signs: Vitals:   07/28/20 0310 07/28/20 0515 07/28/20 0641 07/28/20 0730  BP:   125/68 (!) 146/73  Pulse: 65 64  68 62  Resp: 18 15 (!) 21 17  Temp:   (!) 97.4 F (36.3 C) 97.9 F (36.6 C)  TempSrc:   Oral Oral  SpO2: 92% 94% 91% 94%  Weight:      Height:       Filed Weights   07/27/20 2158  Weight: (!) 184.6 kg   Body mass index is 55.2 kg/m.   Gen Exam:Alert awake-not in any distress HEENT:atraumatic, normocephalic Chest: B/L clear to auscultation anteriorly CVS:S1S2 regular Abdomen:soft non tender, non distended Extremities:no edema Neurology: Non focal Skin: no rash  I have personally reviewed following labs and imaging studies  LABORATORY DATA: CBC: Recent Labs  Lab 07/27/20 0937 07/28/20 0242  WBC 9.1 10.2  HGB 15.6 15.4  HCT 45.7 45.3  MCV 89.4 89.5  PLT 230 215    Basic Metabolic Panel: Recent Labs  Lab 07/27/20 0937 07/28/20 0241  NA 133* 133*  K 4.7 4.0  CL 104 99  CO2 23 25  GLUCOSE 162* 125*  BUN 12 11  CREATININE 0.85 0.90  CALCIUM 8.8* 8.5*    GFR: Estimated Creatinine Clearance: 157.9 mL/min (by C-G formula based on SCr of 0.9 mg/dL).  Liver Function Tests: Recent Labs  Lab 07/27/20 0937  AST 26  ALT 14  ALKPHOS 55  BILITOT 1.1  PROT 8.0  ALBUMIN 3.5   No results for input(s): LIPASE, AMYLASE in the last 168 hours. No results for input(s): AMMONIA in the last 168 hours.  Coagulation Profile: No results for input(s): INR, PROTIME in the last 168 hours.  Cardiac Enzymes: No results for input(s): CKTOTAL, CKMB, CKMBINDEX, TROPONINI in the last 168 hours.  BNP (last 3 results) No results for input(s): PROBNP in the last 8760 hours.  Lipid Profile: No results for input(s): CHOL, HDL, LDLCALC, TRIG, CHOLHDL, LDLDIRECT in the last 72 hours.  Thyroid Function Tests: No results for input(s): TSH, T4TOTAL, FREET4, T3FREE, THYROIDAB in the last 72 hours.  Anemia Panel: No results for input(s): VITAMINB12, FOLATE, FERRITIN, TIBC, IRON, RETICCTPCT in the last 72 hours.  Urine analysis: No results found for: COLORURINE, APPEARANCEUR,  LABSPEC, PHURINE, GLUCOSEU, HGBUR, BILIRUBINUR, KETONESUR, PROTEINUR, UROBILINOGEN, NITRITE, LEUKOCYTESUR  Sepsis Labs: Lactic Acid, Venous No results found for: LATICACIDVEN  MICROBIOLOGY: Recent Results (from the past 240 hour(s))  SARS CORONAVIRUS 2 (TAT 6-24 HRS) Nasopharyngeal Nasopharyngeal Swab     Status: None   Collection Time: 07/27/20  4:03 PM   Specimen: Nasopharyngeal Swab  Result Value Ref Range Status   SARS Coronavirus 2 NEGATIVE NEGATIVE Final    Comment: (NOTE) SARS-CoV-2 target nucleic acids are NOT DETECTED.  The SARS-CoV-2 RNA is generally detectable in upper and lower respiratory specimens during the acute phase of infection. Negative results do not preclude SARS-CoV-2 infection, do not rule out co-infections with other pathogens, and should not be used as the sole basis for treatment  or other patient management decisions. Negative results must be combined with clinical observations, patient history, and epidemiological information. The expected result is Negative.  Fact Sheet for Patients: HairSlick.no  Fact Sheet for Healthcare Providers: quierodirigir.com  This test is not yet approved or cleared by the [place] FDA and  has been authorized for detection and/or diagnosis of SARS-CoV-2 by FDA under an Emergency Use Authorization (EUA). This EUA will remain  in effect (meaning this test can be used) for the duration of the COVID-19 declaration under Se ction 564(b)(1) of the Act, 21 U.S.C. section 360bbb-3(b)(1), unless the authorization is terminated or revoked sooner.  Performed at [facility], [address] [place], [place] [address]     RADIOLOGY STUDIES/RESULTS: DG Chest 2 View  Result Date: 07/27/2020 CLINICAL DATA:  Shortness of breath and chest pain for EXAM: CHEST - 2 VIEW COMPARISON:  07/19/2019 FINDINGS: Cardiac shadow is stable. The lungs are well aerated bilaterally.  No focal infiltrate or effusion is seen. No bony abnormality is noted. IMPRESSION: No acute abnormality noted. Electronically Signed   By: Alcide Clever M.D.   On: 07/27/2020 10:13   CT Angio Chest PE W/Cm &/Or Wo Cm  Result Date: 07/27/2020 CLINICAL DATA:  Shortness of breath and elevated D-dimer EXAM: CT ANGIOGRAPHY CHEST WITH CONTRAST TECHNIQUE: Multidetector CT imaging of the chest was performed using the standard protocol during bolus administration of intravenous contrast. Multiplanar CT image reconstructions and MIPs were obtained to evaluate the vascular anatomy. CONTRAST:  38mL OMNIPAQUE IOHEXOL 350 MG/ML SOLN COMPARISON:  Chest radiograph July 27, 2020; chest CT July 19, 2020 FINDINGS: Cardiovascular: There are multiple pulmonary emboli arising from the main right pulmonary artery extending into multiple right lower lobe pulmonary arteries as well as pulmonary emboli in multiple left lower lobe pulmonary arteries. Previous pulmonary embolus in the posterior segment of the left upper lobe pulmonary artery is not present. The right ventricle to left ventricle diameter ratio is 1.2, evident of right heart strain. There is no thoracic aortic aneurysm or dissection. Visualized great vessels appear unremarkable. There is aortic atherosclerosis. There are occasional foci of coronary artery calcification. Mediastinum/Nodes: Visualized thyroid appears unremarkable. There is moderate mediastinal fat. No evident thoracic adenopathy. No esophageal lesions. Lungs/Pleura: There is a suspected pulmonary infarct in the posterior right base. There are areas of mild atelectatic change bilaterally. There is mosaic attenuation in each upper lobe posteriorly. No appreciable pleural effusions. Trachea and major bronchial structures appear patent. No pneumothorax. Upper Abdomen: Visualized upper abdominal structures appear normal. Musculoskeletal: No blastic or lytic bone lesions. No chest wall lesions. Review of the MIP  images confirms the above findings. IMPRESSION: 1. Multiple pulmonary emboli in a distribution similar to prior study. It is difficult to ascertain how much of the burden of pulmonary embolus currently is acute versus chronic. Acute pulmonary embolus must be of concern given the appearance of the current pulmonary emboli. Specifically, these pulmonary emboli show diffuse decreased attenuation suggesting acute pulmonary embolus. There is no pulmonary arterial vessels sclerosis or narrowing. Note that there is right heart strain. Positive for acute PE with CT evidence of right heart strain (RV/LV Ratio = 1.2) consistent with at least submassive (intermediate risk) PE. The presence of right heart strain has been associated with an increased risk of morbidity and mortality. Please refer to the "PE Focused" order set in EPIC. 2. No thoracic aortic aneurysm or dissection. There is aortic atherosclerosis and occasional foci coronary artery calcification. 3. Suspect pulmonary  infarct posterior left base. There may be a small associated focus of atelectasis/pneumonia in this area. 4. Areas of mosaic attenuation in the upper lobes which likely represents underlying small airways obstructive disease. 5.  No evident thoracic adenopathy. Aortic Atherosclerosis (ICD10-I70.0). Critical Value/emergent results were called by telephone at the time of interpretation on 07/27/2020 at 3:10 pm to provider [facility] Surgery LLCELLIOTT WENTZ , who verbally acknowledged these results. Electronically Signed   By: Bretta BangWilliam  Woodruff III M.D.   On: 07/27/2020 15:10   ECHOCARDIOGRAM LIMITED  Result Date: 07/27/2020    ECHOCARDIOGRAM LIMITED REPORT   Patient Name:   Earl Donovan Date of Exam: 07/27/2020 Medical Rec #:  [id number]              Height:       72.0 in Accession #:    [id number]             Weight:       407.0 lb Date of Birth:  March 01, 1965              BSA:          2.882 m Patient Age:    55 years               BP:           171/84 mmHg  Patient Gender: M                      HR:           71 bpm. Exam Location:  Inpatient Procedure: 2D Echo, Limited Echo and Color Doppler Indications:    I26.02 Pulmonary embolus  History:        Patient has prior history of Echocardiogram examinations, most                 recent 07/20/2019. Risk Factors:Morbid obesity.  Sonographer:    Roosvelt Maserachel Lane RDCS Referring Phys: [id number] JENNIFER YATES IMPRESSIONS  1. Technically difficult study with limited views  2. Left ventricular ejection fraction, by estimation, is 60 to 65%. The left ventricle has normal function. The left ventricle has no regional wall motion abnormalities.  3. Right ventricular systolic function was not well visualized. The right ventricular size is not well visualized.  4. The mitral valve is grossly normal. No evidence of mitral valve regurgitation.  5. The aortic valve was not well visualized. Aortic valve regurgitation is not visualized. FINDINGS  Left Ventricle: Left ventricular ejection fraction, by estimation, is 60 to 65%. The left ventricle has normal function. The left ventricle has no regional wall motion abnormalities. Right Ventricle: The right ventricular size is not well visualized. Right ventricular systolic function was not well visualized. Pericardium: Trivial pericardial effusion is present. Mitral Valve: The mitral valve is grossly normal. Aortic Valve: The aortic valve was not well visualized. Aortic valve regurgitation is not visualized. RIGHT VENTRICLE RV Basal diam:  4.90 cm RV Mid diam:    3.60 cm Epifanio Lescheshristopher Schumann MD Electronically signed by Epifanio Lescheshristopher Schumann MD Signature Date/Time: 07/27/2020/7:02:58 PM    Final      LOS: 0 days   Jeoffrey MassedShanker Lorren Rossetti, MD  [facility]    To contact the attending provider between 7A-7P or the covering provider during after hours 7P-7A, please log into the web site [email or web address] and access using universal [place] password for that web site. If you do not have the password,  please call the hospital operator.  07/28/2020, 10:01 AM

## 2020-07-28 NOTE — Progress Notes (Signed)
Lower extremity venous has been completed.   Preliminary results in CV Proc.   Blanch Media 07/28/2020 11:44 AM

## 2020-07-28 NOTE — ED Notes (Signed)
Up to bathroom with no assistance

## 2020-07-28 NOTE — ED Notes (Signed)
[place] in progress to bedside.

## 2020-07-29 ENCOUNTER — Other Ambulatory Visit (HOSPITAL_COMMUNITY): Payer: Self-pay

## 2020-07-29 DIAGNOSIS — G4733 Obstructive sleep apnea (adult) (pediatric): Secondary | ICD-10-CM | POA: Diagnosis not present

## 2020-07-29 DIAGNOSIS — E1169 Type 2 diabetes mellitus with other specified complication: Secondary | ICD-10-CM | POA: Diagnosis not present

## 2020-07-29 DIAGNOSIS — I2699 Other pulmonary embolism without acute cor pulmonale: Secondary | ICD-10-CM | POA: Diagnosis not present

## 2020-07-29 LAB — HEMOGLOBIN A1C
Hgb A1c MFr Bld: 7.5 % — ABNORMAL HIGH (ref 4.8–5.6)
Mean Plasma Glucose: 169 mg/dL

## 2020-07-29 LAB — GLUCOSE, CAPILLARY
Glucose-Capillary: 124 mg/dL — ABNORMAL HIGH (ref 70–99)
Glucose-Capillary: 132 mg/dL — ABNORMAL HIGH (ref 70–99)

## 2020-07-29 LAB — CBC
HCT: 42.3 % (ref 39.0–52.0)
Hemoglobin: 14.6 g/dL (ref 13.0–17.0)
MCH: 30.7 pg (ref 26.0–34.0)
MCHC: 34.5 g/dL (ref 30.0–36.0)
MCV: 88.9 fL (ref 80.0–100.0)
Platelets: 223 10*3/uL (ref 150–400)
RBC: 4.76 MIL/uL (ref 4.22–5.81)
RDW: 13.2 % (ref 11.5–15.5)
WBC: 9.6 10*3/uL (ref 4.0–10.5)
nRBC: 0 % (ref 0.0–0.2)

## 2020-07-29 MED ORDER — APIXABAN (ELIQUIS) VTE STARTER PACK (10MG AND 5MG)
ORAL_TABLET | ORAL | 0 refills | Status: DC
  Filled 2020-07-29: qty 74, 30d supply, fill #0

## 2020-07-29 NOTE — Plan of Care (Signed)

## 2020-07-29 NOTE — TOC Transition Note (Signed)
Transition of Care [facility]) - CM/SW Discharge Note   Patient Details  Name: Octavius Shin MRN: [id number] Date of Birth: 05-28-1965  Transition of [facility]) CM/SW Contact:  Zenon Mayo, RN Phone Number: 07/29/2020, 8:57 AM   Clinical Narrative:    NCM spoke with patient, gave him the 10.00 copay coupon, informed him of the 75.00 copay amt, informed him that he could use the 10.00 copay card after his deductible has been met.  Awaiting benefit check for additional information.   Final next level of care: Home/Self Care Barriers to Discharge: No Barriers Identified   Patient Goals and CMS Choice Patient states their goals for this hospitalization and ongoing recovery are:: return home   Choice offered to / list presented to : NA  Discharge Placement                       Discharge Plan and Services                  DME Agency: NA       HH Arranged: NA          Social Determinants of Health (SDOH) Interventions     Readmission Risk Interventions No flowsheet data found.

## 2020-07-29 NOTE — TOC Benefit Eligibility Note (Signed)
Patient Product/process development scientist completed.    The patient is currently admitted and upon discharge could be taking Eliquis 5 mg.  The current 30 day co-pay is, $75.00.   The patient is insured through [employer]    Roland Earl, CPhT Pharmacy Patient Advocate Specialist [place] Antimicrobial Stewardship Team Direct Number: [phone]  Fax: [phone]

## 2020-07-29 NOTE — TOC Benefit Eligibility Note (Signed)
Transition of Care [facility]) Benefit Eligibility Note    Patient Details  Name: Earl Donovan MRN: [id number] Date of Birth: April 29, 1965   Medication/Dose: Arne Cleveland  10 MG BID  Covered?: Yes  Tier: 2 Drug  Prescription Coverage [facility]erred [facility]   [place] with Person/Company/Phone Number:: HANNA  @   [employer] #  [phone]  Co-Pay: $75.00  Prior Approval: No  Deductible: Met (OUT-OF-POCKET:UNMET)  Additional Notes: ELIQUIS  5 MG BID COVER- YES CO-PAY- $75.00  TIER- 2 DRUG  P/A-NO    Memory Argue Phone Number: 07/29/2020, 10:49 AM

## 2020-07-29 NOTE — Discharge Instructions (Addendum)
Information on my medicine - ELIQUIS (apixaban)  Why was Eliquis prescribed for you? Eliquis was prescribed to treat blood clots that may have been found in the veins of your legs (deep vein thrombosis) or in your lungs (pulmonary embolism) and to reduce the risk of them occurring again.  What do You need to know about Eliquis ? The starting dose is 10 mg (two 5 mg tablets) taken TWICE daily for the FIRST SEVEN (7) DAYS, then on 08/05/20  the dose is reduced to ONE 5 mg tablet taken TWICE daily.  Eliquis may be taken with or without food.   Try to take the dose about the same time in the morning and in the evening. If you have difficulty swallowing the tablet whole please discuss with your pharmacist how to take the medication safely.  Take Eliquis exactly as prescribed and DO NOT stop taking Eliquis without talking to the doctor who prescribed the medication.  Stopping may increase your risk of developing a new blood clot.  Refill your prescription before you run out.  After discharge, you should have regular check-up appointments with your healthcare provider that is prescribing your Eliquis.    What do you do if you miss a dose? If a dose of ELIQUIS is not taken at the scheduled time, take it as soon as possible on the same day and twice-daily administration should be resumed. The dose should not be doubled to make up for a missed dose.  Important Safety Information A possible side effect of Eliquis is bleeding. You should call your healthcare provider right away if you experience any of the following: Bleeding from an injury or your nose that does not stop. Unusual colored urine (red or dark brown) or unusual colored stools (red or black). Unusual bruising for unknown reasons. A serious fall or if you hit your head (even if there is no bleeding).  Some medicines may interact with Eliquis and might increase your risk of bleeding or clotting while on Eliquis. To help avoid this,  consult your healthcare provider or pharmacist prior to using any new prescription or non-prescription medications, including herbals, vitamins, non-steroidal anti-inflammatory drugs (NSAIDs) and supplements.  This website has more information on Eliquis (apixaban): http://www.eliquis.com/eliquis/home  

## 2020-07-29 NOTE — Discharge Summary (Signed)
PATIENT DETAILS Name: Earl Donovan Age: 55 y.o. Sex: male Date of Birth: 1966/01/24 MRN: [id number]. Admitting Physician: Jonah Blue, MD JJO:ACZYSA, Timoteo Expose, MD  Admit Date: 07/27/2020 Discharge date: 07/29/2020  Recommendations for Outpatient Follow-up:  Follow up with PCP in 1-2 weeks Please obtain CMP/CBC in one week Please consider outpatient referral to hematology. Started on Eliquis-starter pack-will need refills  Admitted From:  Home  Disposition: Home   Home Health: No  Equipment/Devices: None  Discharge Condition: Stable  CODE STATUS: FULL CODE  Diet recommendation:  Diet Order             Diet - low sodium heart healthy           Diet Carb Modified           Diet Heart Room service appropriate? Yes; Fluid consistency: Thin  Diet effective now                    Brief Narrative: Patient is a 55 y.o. male with history of VTE-off anticoagulation for around 6 months-presented with worsening shortness of breath-was found to have recurrent pulmonary embolism-was started on IV heparin and admitted to the hospitalist service.   Significant events: 6/13>> shortness of breath-admit for PE.   Significant studies: 6/13>> CTA chest: Multiple PE-submassive, CT evidence of RV strain. 6/13>> Echo: EF 60-65%, RV systolic function not well visualized. 6/14>> bilateral lower extremity Doppler: Age indeterminate DVT involving the left popliteal vein   Antimicrobial therapy: None   Microbiology data: 6/13>> COVID PCR: Negative   Procedures: None   Consults None    Brief Hospital Course: Submassive PE-age indeterminate left lower extremity DVT: Second episode of VTE-is morbidly obese-sedentary lifestyle-we will need outpatient hematology referral for further work-up.  Although he had CT evidence of RV strain-there was no clinical evidence of RV strain-he was not hypoxic as well.  Echo with preserved LV function-however RV systolic  function was unable to be evaluated.  Patient denies any shortness of breath this morning-was observed walking out of the bathroom without any issues-he was initially treated with IV heparin-and was transitioned to Eliquis-he has tolerated anticoagulation without any issues overnight.  He will follow-up with his primary care practitioner for further continued monitoring.     DM-2: CBGs were stable with SSI while inpatient-resume usual home regimen on discharge.     HTN: BP controlled-continue HCTZ/ARB  HLD: Continue Lipitor  OSA: Continue CPAP   Venous stasis dermatitis: Continue compression stockings   Morbid Obesity: Estimated body mass index is 55.2 kg/m as calculated from the following:   Height as of this encounter: 6' (1.829 m).   Weight as of this encounter: 184.6 kg.    Discharge Diagnoses:  Principal Problem:   PE (pulmonary thromboembolism) (HCC) Active Problems:   Hyperlipidemia associated with type 2 diabetes mellitus (HCC)   Morbid obesity with BMI of 50.0-59.9, adult (HCC)   Venous stasis dermatitis of both lower extremities   OSA on CPAP   Type 2 diabetes mellitus with other specified complication [facility])   Discharge Instructions:  Activity:  As tolerated   Discharge Instructions     Call MD for:  difficulty breathing, headache or visual disturbances   Complete by: As directed    Call MD for:  extreme fatigue   Complete by: As directed    Call MD for:  persistant dizziness or light-headedness   Complete by: As directed    Diet - low sodium heart healthy  Complete by: As directed    Diet Carb Modified   Complete by: As directed    Discharge instructions   Complete by: As directed    Follow with Primary MD  [place], Betty G, MD in 1-2 weeks  You have been started on a blood thinner-Eliquis-you will be getting a starter pack-please contact your primary care practitioner in the next week or so for further refills.  Since this is your second episode of  blood clots-you will require indefinite treatment with anticoagulation/blood thinners.  Please ask your primary care practitioner to refer you to a blood doctor/hematologist-as you might require further work-up to determine why you develop blood clots.  Please get a complete blood count and chemistry panel checked by your Primary MD at your next visit, and again as instructed by your Primary MD.  Get Medicines reviewed and adjusted: Please take all your medications with you for your next visit with your Primary MD  Laboratory/radiological data: Please request your Primary MD to go over all hospital tests and procedure/radiological results at the follow up, please ask your Primary MD to get all Hospital records sent to his/her office.  In some cases, they will be blood work, cultures and biopsy results pending at the time of your discharge. Please request that your primary care M.D. follows up on these results.  Also Note the following: If you experience worsening of your admission symptoms, develop shortness of breath, life threatening emergency, suicidal or homicidal thoughts you must seek medical attention immediately by calling 911 or calling your MD immediately  if symptoms less severe.  You must read complete instructions/literature along with all the possible adverse reactions/side effects for all the Medicines you take and that have been prescribed to you. Take any new Medicines after you have completely understood and accpet all the possible adverse reactions/side effects.   Do not drive when taking Pain medications or sleeping medications (Benzodaizepines)  Do not take more than prescribed Pain, Sleep and Anxiety Medications. It is not advisable to combine anxiety,sleep and pain medications without talking with your primary care practitioner  Special Instructions: If you have smoked or chewed Tobacco  in the last 2 yrs please stop smoking, stop any regular Alcohol  and or any  Recreational drug use.  Wear Seat belts while driving.  Please note: You were cared for by a hospitalist during your hospital stay. Once you are discharged, your primary care physician will handle any further medical issues. Please note that NO REFILLS for any discharge medications will be authorized once you are discharged, as it is imperative that you return to your primary care physician (or establish a relationship with a primary care physician if you do not have one) for your post hospital discharge needs so that they can reassess your need for medications and monitor your lab values.   Increase activity slowly   Complete by: As directed       Allergies as of 07/29/2020   No Known Allergies      Medication List     STOP taking these medications    aspirin EC 81 MG tablet       TAKE these medications    allopurinol 300 MG tablet Commonly known as: ZYLOPRIM Take 1 tablet (300 mg total) by mouth daily.   Apixaban Starter Pack (10mg  and 5mg ) Commonly known as: ELIQUIS STARTER PACK Take as directed on package: start with two-5mg  tablets twice daily for 7 days. On day 8, switch to one-5mg  tablet  twice daily.   atorvastatin 20 MG tablet Commonly known as: LIPITOR Take 1 tablet (20 mg total) by mouth daily.   hydrochlorothiazide 25 MG tablet Commonly known as: HYDRODIURIL Take 1 tablet (25 mg total) by mouth daily.   olmesartan 20 MG tablet Commonly known as: BENICAR TAKE 1 TABLET BY MOUTH EVERY DAY (MAIN. RETAIL FILLS EXCEEDED) What changed: See the new instructions.   Ozempic (0.25 or 0.5 MG/DOSE) 2 MG/1.5ML Sopn Generic drug: Semaglutide(0.25 or 0.5MG /DOS) Inject 1 mg into the skin once a week.   triamcinolone 0.1 % cream : eucerin Crea Apply 1 application topically 2 (two) times daily as needed for rash or itching.        Follow-up Information     [place], Betty G, MD. Schedule an appointment as soon as possible for a visit in 1 week(s).   Specialty:  Family Medicine Why: Hospital follow up Please ask for refills for your blood thinner during this visit. Contact information: [address] [place] [id number] [phone]                No Known Allergies   Other Procedures/Studies: DG Chest 2 View  Result Date: 07/27/2020 CLINICAL DATA:  Shortness of breath and chest pain for EXAM: CHEST - 2 VIEW COMPARISON:  07/19/2019 FINDINGS: Cardiac shadow is stable. The lungs are well aerated bilaterally. No focal infiltrate or effusion is seen. No bony abnormality is noted. IMPRESSION: No acute abnormality noted. Electronically Signed   By: Alcide CleverMark  Lukens M.D.   On: 07/27/2020 10:13   CT Angio Chest PE W/Cm &/Or Wo Cm  Result Date: 07/27/2020 CLINICAL DATA:  Shortness of breath and elevated D-dimer EXAM: CT ANGIOGRAPHY CHEST WITH CONTRAST TECHNIQUE: Multidetector CT imaging of the chest was performed using the standard protocol during bolus administration of intravenous contrast. Multiplanar CT image reconstructions and MIPs were obtained to evaluate the vascular anatomy. CONTRAST:  50mL OMNIPAQUE IOHEXOL 350 MG/ML SOLN COMPARISON:  Chest radiograph July 27, 2020; chest CT July 19, 2020 FINDINGS: Cardiovascular: There are multiple pulmonary emboli arising from the main right pulmonary artery extending into multiple right lower lobe pulmonary arteries as well as pulmonary emboli in multiple left lower lobe pulmonary arteries. Previous pulmonary embolus in the posterior segment of the left upper lobe pulmonary artery is not present. The right ventricle to left ventricle diameter ratio is 1.2, evident of right heart strain. There is no thoracic aortic aneurysm or dissection. Visualized great vessels appear unremarkable. There is aortic atherosclerosis. There are occasional foci of coronary artery calcification. Mediastinum/Nodes: Visualized thyroid appears unremarkable. There is moderate mediastinal fat. No evident thoracic adenopathy. No  esophageal lesions. Lungs/Pleura: There is a suspected pulmonary infarct in the posterior right base. There are areas of mild atelectatic change bilaterally. There is mosaic attenuation in each upper lobe posteriorly. No appreciable pleural effusions. Trachea and major bronchial structures appear patent. No pneumothorax. Upper Abdomen: Visualized upper abdominal structures appear normal. Musculoskeletal: No blastic or lytic bone lesions. No chest wall lesions. Review of the MIP images confirms the above findings. IMPRESSION: 1. Multiple pulmonary emboli in a distribution similar to prior study. It is difficult to ascertain how much of the burden of pulmonary embolus currently is acute versus chronic. Acute pulmonary embolus must be of concern given the appearance of the current pulmonary emboli. Specifically, these pulmonary emboli show diffuse decreased attenuation suggesting acute pulmonary embolus. There is no pulmonary arterial vessels sclerosis or narrowing. Note that there is right heart strain. Positive for  acute PE with CT evidence of right heart strain (RV/LV Ratio = 1.2) consistent with at least submassive (intermediate risk) PE. The presence of right heart strain has been associated with an increased risk of morbidity and mortality. Please refer to the "PE Focused" order set in EPIC. 2. No thoracic aortic aneurysm or dissection. There is aortic atherosclerosis and occasional foci coronary artery calcification. 3. Suspect pulmonary infarct posterior left base. There may be a small associated focus of atelectasis/pneumonia in this area. 4. Areas of mosaic attenuation in the upper lobes which likely represents underlying small airways obstructive disease. 5.  No evident thoracic adenopathy. Aortic Atherosclerosis (ICD10-I70.0). Critical Value/emergent results were called by telephone at the time of interpretation on 07/27/2020 at 3:10 pm to provider [facility] , who verbally acknowledged these results.  Electronically Signed   By: Bretta Bang III M.D.   On: 07/27/2020 15:10   VAS [place] LOWER EXTREMITY VENOUS (DVT)  Result Date: 07/28/2020  Lower Venous DVT Study Patient Name:  Earl Donovan  Date of Exam:   07/28/2020 Medical Rec #: [id number]               Accession #:    [id number] Date of Birth: [birth year]               Patient Gender: M Patient Age:   55Y Exam Location:  [facility] Procedure:      VAS [place] LOWER EXTREMITY VENOUS (DVT) Referring Phys: [id number] Werner Lean [facility] --------------------------------------------------------------------------------  Indications: Pulmonary embolism.  Limitations: Body habitus and poor ultrasound/tissue interface. Comparison Study: 07/20/19 prior Performing Technologist: [place] Ponder RVS  Examination Guidelines: A complete evaluation includes B-mode imaging, spectral Doppler, color Doppler, and power Doppler as needed of all accessible portions of each vessel. Bilateral testing is considered an integral part of a complete examination. Limited examinations for reoccurring indications may be performed as noted. The reflux portion of the exam is performed with the patient in reverse Trendelenburg.  +---------+---------------+---------+-----------+----------+-------------------+ RIGHT    CompressibilityPhasicitySpontaneityPropertiesThrombus Aging      +---------+---------------+---------+-----------+----------+-------------------+ CFV      Full           Yes      Yes                                      +---------+---------------+---------+-----------+----------+-------------------+ SFJ      Full                                                             +---------+---------------+---------+-----------+----------+-------------------+ FV Prox  Full                                                             +---------+---------------+---------+-----------+----------+-------------------+ FV Mid   Full                                                              +---------+---------------+---------+-----------+----------+-------------------+  FV DistalFull                                                             +---------+---------------+---------+-----------+----------+-------------------+ PFV      Full                                                             +---------+---------------+---------+-----------+----------+-------------------+ POP      Full           Yes      Yes                                      +---------+---------------+---------+-----------+----------+-------------------+ PTV                                                   Not well visualized +---------+---------------+---------+-----------+----------+-------------------+ PERO                                                  Not well visualized +---------+---------------+---------+-----------+----------+-------------------+   +---------+---------------+---------+-----------+----------+-------------------+ LEFT     CompressibilityPhasicitySpontaneityPropertiesThrombus Aging      +---------+---------------+---------+-----------+----------+-------------------+ CFV      Full           Yes      Yes                                      +---------+---------------+---------+-----------+----------+-------------------+ SFJ      Full                                                             +---------+---------------+---------+-----------+----------+-------------------+ FV Prox  Full                                                             +---------+---------------+---------+-----------+----------+-------------------+ FV Mid                  Yes      Yes                                      +---------+---------------+---------+-----------+----------+-------------------+ FV Distal               Yes      Yes                                       +---------+---------------+---------+-----------+----------+-------------------+  PFV      Full                                                             +---------+---------------+---------+-----------+----------+-------------------+ POP      None           Yes      Yes                  Age Indeterminate   +---------+---------------+---------+-----------+----------+-------------------+ PTV                                                   Not well visualized +---------+---------------+---------+-----------+----------+-------------------+ PERO                                                  Not well visualized +---------+---------------+---------+-----------+----------+-------------------+     Summary: RIGHT: - There is no evidence of deep vein thrombosis in the lower extremity.  - No cystic structure found in the popliteal fossa.  LEFT: - Findings consistent with age indeterminate deep vein thrombosis involving the left popliteal vein. - No cystic structure found in the popliteal fossa.  *See table(s) above for measurements and observations.    Preliminary    ECHOCARDIOGRAM LIMITED  Result Date: 07/27/2020    ECHOCARDIOGRAM LIMITED REPORT   Patient Name:   Earl Donovan Date of Exam: 07/27/2020 Medical Rec #:  [id number]              Height:       72.0 in Accession #:    [id number]             Weight:       407.0 lb Date of Birth:  [birth year]              BSA:          2.882 m Patient Age:    55 years               BP:           171/84 mmHg Patient Gender: M                      HR:           71 bpm. Exam Location:  Inpatient Procedure: 2D Echo, Limited Echo and Color Doppler Indications:    I26.02 Pulmonary embolus  History:        Patient has prior history of Echocardiogram examinations, most                 recent 07/20/2019. Risk Factors:Morbid obesity.  Sonographer:    Roosvelt Maser RDCS Referring Phys: [id number] JENNIFER YATES IMPRESSIONS  1. Technically difficult study with  limited views  2. Left ventricular ejection fraction, by estimation, is 60 to 65%. The left ventricle has normal function. The left ventricle has no regional wall motion abnormalities.  3. Right ventricular systolic function was not well visualized. The right ventricular size is not well visualized.  4. The  mitral valve is grossly normal. No evidence of mitral valve regurgitation.  5. The aortic valve was not well visualized. Aortic valve regurgitation is not visualized. FINDINGS  Left Ventricle: Left ventricular ejection fraction, by estimation, is 60 to 65%. The left ventricle has normal function. The left ventricle has no regional wall motion abnormalities. Right Ventricle: The right ventricular size is not well visualized. Right ventricular systolic function was not well visualized. Pericardium: Trivial pericardial effusion is present. Mitral Valve: The mitral valve is grossly normal. Aortic Valve: The aortic valve was not well visualized. Aortic valve regurgitation is not visualized. RIGHT VENTRICLE RV Basal diam:  4.90 cm RV Mid diam:    3.60 cm Epifanio Lesches MD Electronically signed by Epifanio Lesches MD Signature Date/Time: 07/27/2020/7:02:58 PM    Final      TODAY-DAY OF DISCHARGE:  Subjective:   Earl Donovan today has no headache,no chest abdominal pain,no new weakness tingling or numbness, feels much better wants to go home today.   Objective:   Blood pressure 122/71, pulse 67, temperature 98.1 F (36.7 C), temperature source Oral, resp. rate 16, height 6' (1.829 m), weight (!) 181.3 kg, SpO2 93 %.  Intake/Output Summary (Last 24 hours) at 07/29/2020 0755 Last data filed at 07/29/2020 0638 Gross per 24 hour  Intake --  Output 675 ml  Net -675 ml   Filed Weights   07/27/20 2158 07/28/20 1604  Weight: (!) 184.6 kg (!) 181.3 kg    Exam: Awake Alert, Oriented *3, No new F.N deficits, Normal affect [place].AT,PERRAL Supple Neck,No JVD, No cervical lymphadenopathy  appriciated.  Symmetrical Chest wall movement, Good air movement bilaterally, CTAB RRR,No Gallops,Rubs or new Murmurs, No Parasternal Heave +ve B.Sounds, Abd Soft, Non tender, No organomegaly appriciated, No rebound -guarding or rigidity. No Cyanosis, Clubbing or edema, No new Rash or bruise   PERTINENT RADIOLOGIC STUDIES: DG Chest 2 View  Result Date: 07/27/2020 CLINICAL DATA:  Shortness of breath and chest pain for EXAM: CHEST - 2 VIEW COMPARISON:  07/19/2019 FINDINGS: Cardiac shadow is stable. The lungs are well aerated bilaterally. No focal infiltrate or effusion is seen. No bony abnormality is noted. IMPRESSION: No acute abnormality noted. Electronically Signed   By: Alcide Clever M.D.   On: 07/27/2020 10:13   CT Angio Chest PE W/Cm &/Or Wo Cm  Result Date: 07/27/2020 CLINICAL DATA:  Shortness of breath and elevated D-dimer EXAM: CT ANGIOGRAPHY CHEST WITH CONTRAST TECHNIQUE: Multidetector CT imaging of the chest was performed using the standard protocol during bolus administration of intravenous contrast. Multiplanar CT image reconstructions and MIPs were obtained to evaluate the vascular anatomy. CONTRAST:  23mL OMNIPAQUE IOHEXOL 350 MG/ML SOLN COMPARISON:  Chest radiograph July 27, 2020; chest CT July 19, 2020 FINDINGS: Cardiovascular: There are multiple pulmonary emboli arising from the main right pulmonary artery extending into multiple right lower lobe pulmonary arteries as well as pulmonary emboli in multiple left lower lobe pulmonary arteries. Previous pulmonary embolus in the posterior segment of the left upper lobe pulmonary artery is not present. The right ventricle to left ventricle diameter ratio is 1.2, evident of right heart strain. There is no thoracic aortic aneurysm or dissection. Visualized great vessels appear unremarkable. There is aortic atherosclerosis. There are occasional foci of coronary artery calcification. Mediastinum/Nodes: Visualized thyroid appears unremarkable. There  is moderate mediastinal fat. No evident thoracic adenopathy. No esophageal lesions. Lungs/Pleura: There is a suspected pulmonary infarct in the posterior right base. There are areas of mild atelectatic change bilaterally. There is mosaic  attenuation in each upper lobe posteriorly. No appreciable pleural effusions. Trachea and major bronchial structures appear patent. No pneumothorax. Upper Abdomen: Visualized upper abdominal structures appear normal. Musculoskeletal: No blastic or lytic bone lesions. No chest wall lesions. Review of the MIP images confirms the above findings. IMPRESSION: 1. Multiple pulmonary emboli in a distribution similar to prior study. It is difficult to ascertain how much of the burden of pulmonary embolus currently is acute versus chronic. Acute pulmonary embolus must be of concern given the appearance of the current pulmonary emboli. Specifically, these pulmonary emboli show diffuse decreased attenuation suggesting acute pulmonary embolus. There is no pulmonary arterial vessels sclerosis or narrowing. Note that there is right heart strain. Positive for acute PE with CT evidence of right heart strain (RV/LV Ratio = 1.2) consistent with at least submassive (intermediate risk) PE. The presence of right heart strain has been associated with an increased risk of morbidity and mortality. Please refer to the "PE Focused" order set in EPIC. 2. No thoracic aortic aneurysm or dissection. There is aortic atherosclerosis and occasional foci coronary artery calcification. 3. Suspect pulmonary infarct posterior left base. There may be a small associated focus of atelectasis/pneumonia in this area. 4. Areas of mosaic attenuation in the upper lobes which likely represents underlying small airways obstructive disease. 5.  No evident thoracic adenopathy. Aortic Atherosclerosis (ICD10-I70.0). Critical Value/emergent results were called by telephone at the time of interpretation on 07/27/2020 at 3:10 pm to  provider [facility] , who verbally acknowledged these results. Electronically Signed   By: Bretta Bang III M.D.   On: 07/27/2020 15:10   VAS [place] LOWER EXTREMITY VENOUS (DVT)  Result Date: 07/28/2020  Lower Venous DVT Study Patient Name:  Earl Donovan  Date of Exam:   07/28/2020 Medical Rec #: [id number]               Accession #:    [id number] Date of Birth: 1966/01/18               Patient Gender: M Patient Age:   55Y Exam Location:  [facility] Procedure:      VAS [place] LOWER EXTREMITY VENOUS (DVT) Referring Phys: [id number] Werner Lean [facility] --------------------------------------------------------------------------------  Indications: Pulmonary embolism.  Limitations: Body habitus and poor ultrasound/tissue interface. Comparison Study: 07/20/19 prior Performing Technologist: [place] Ponder RVS  Examination Guidelines: A complete evaluation includes B-mode imaging, spectral Doppler, color Doppler, and power Doppler as needed of all accessible portions of each vessel. Bilateral testing is considered an integral part of a complete examination. Limited examinations for reoccurring indications may be performed as noted. The reflux portion of the exam is performed with the patient in reverse Trendelenburg.  +---------+---------------+---------+-----------+----------+-------------------+ RIGHT    CompressibilityPhasicitySpontaneityPropertiesThrombus Aging      +---------+---------------+---------+-----------+----------+-------------------+ CFV      Full           Yes      Yes                                      +---------+---------------+---------+-----------+----------+-------------------+ SFJ      Full                                                             +---------+---------------+---------+-----------+----------+-------------------+  FV Prox  Full                                                              +---------+---------------+---------+-----------+----------+-------------------+ FV Mid   Full                                                             +---------+---------------+---------+-----------+----------+-------------------+ FV DistalFull                                                             +---------+---------------+---------+-----------+----------+-------------------+ PFV      Full                                                             +---------+---------------+---------+-----------+----------+-------------------+ POP      Full           Yes      Yes                                      +---------+---------------+---------+-----------+----------+-------------------+ PTV                                                   Not well visualized +---------+---------------+---------+-----------+----------+-------------------+ PERO                                                  Not well visualized +---------+---------------+---------+-----------+----------+-------------------+   +---------+---------------+---------+-----------+----------+-------------------+ LEFT     CompressibilityPhasicitySpontaneityPropertiesThrombus Aging      +---------+---------------+---------+-----------+----------+-------------------+ CFV      Full           Yes      Yes                                      +---------+---------------+---------+-----------+----------+-------------------+ SFJ      Full                                                             +---------+---------------+---------+-----------+----------+-------------------+ FV Prox  Full                                                             +---------+---------------+---------+-----------+----------+-------------------+  FV Mid                  Yes      Yes                                      +---------+---------------+---------+-----------+----------+-------------------+ FV  Distal               Yes      Yes                                      +---------+---------------+---------+-----------+----------+-------------------+ PFV      Full                                                             +---------+---------------+---------+-----------+----------+-------------------+ POP      None           Yes      Yes                  Age Indeterminate   +---------+---------------+---------+-----------+----------+-------------------+ PTV                                                   Not well visualized +---------+---------------+---------+-----------+----------+-------------------+ PERO                                                  Not well visualized +---------+---------------+---------+-----------+----------+-------------------+     Summary: RIGHT: - There is no evidence of deep vein thrombosis in the lower extremity.  - No cystic structure found in the popliteal fossa.  LEFT: - Findings consistent with age indeterminate deep vein thrombosis involving the left popliteal vein. - No cystic structure found in the popliteal fossa.  *See table(s) above for measurements and observations.    Preliminary    ECHOCARDIOGRAM LIMITED  Result Date: 07/27/2020    ECHOCARDIOGRAM LIMITED REPORT   Patient Name:   Earl Donovan Date of Exam: 07/27/2020 Medical Rec #:  [id number]              Height:       72.0 in Accession #:    [id number]             Weight:       407.0 lb Date of Birth:  September 19, 1965              BSA:          2.882 m Patient Age:    55 years               BP:           171/84 mmHg Patient Gender: M                      HR:           71 bpm. Exam Location:  Inpatient Procedure: 2D Echo, Limited Echo and Color Doppler Indications:    I26.02 Pulmonary embolus  History:        Patient has prior history of Echocardiogram examinations, most                 recent 07/20/2019. Risk Factors:Morbid obesity.  Sonographer:    Roosvelt Maser RDCS Referring  Phys: [id number] JENNIFER YATES IMPRESSIONS  1. Technically difficult study with limited views  2. Left ventricular ejection fraction, by estimation, is 60 to 65%. The left ventricle has normal function. The left ventricle has no regional wall motion abnormalities.  3. Right ventricular systolic function was not well visualized. The right ventricular size is not well visualized.  4. The mitral valve is grossly normal. No evidence of mitral valve regurgitation.  5. The aortic valve was not well visualized. Aortic valve regurgitation is not visualized. FINDINGS  Left Ventricle: Left ventricular ejection fraction, by estimation, is 60 to 65%. The left ventricle has normal function. The left ventricle has no regional wall motion abnormalities. Right Ventricle: The right ventricular size is not well visualized. Right ventricular systolic function was not well visualized. Pericardium: Trivial pericardial effusion is present. Mitral Valve: The mitral valve is grossly normal. Aortic Valve: The aortic valve was not well visualized. Aortic valve regurgitation is not visualized. RIGHT VENTRICLE RV Basal diam:  4.90 cm RV Mid diam:    3.60 cm Epifanio Lesches MD Electronically signed by Epifanio Lesches MD Signature Date/Time: 07/27/2020/7:02:58 PM    Final      PERTINENT LAB RESULTS: CBC: Recent Labs    07/28/20 0242 07/29/20 0126  WBC 10.2 9.6  HGB 15.4 14.6  HCT 45.3 42.3  PLT 215 223   CMET CMP     Component Value Date/Time   NA 133 (L) 07/28/2020 0241   K 4.0 07/28/2020 0241   CL 99 07/28/2020 0241   CO2 25 07/28/2020 0241   GLUCOSE 125 (H) 07/28/2020 0241   BUN 11 07/28/2020 0241   CREATININE 0.90 07/28/2020 0241   CREATININE 0.83 03/27/2020 1614   CALCIUM 8.5 (L) 07/28/2020 0241   PROT 8.0 07/27/2020 0937   ALBUMIN 3.5 07/27/2020 0937   AST 26 07/27/2020 0937   ALT 14 07/27/2020 0937   ALKPHOS 55 07/27/2020 0937   BILITOT 1.1 07/27/2020 0937   GFRNONAA >60 07/28/2020 0241   GFRNONAA 99  11/05/2019 0814   GFRAA 115 11/05/2019 0814    GFR Estimated Creatinine Clearance: 156.2 mL/min (by C-G formula based on SCr of 0.9 mg/dL). No results for input(s): LIPASE, AMYLASE in the last 72 hours. No results for input(s): CKTOTAL, CKMB, CKMBINDEX, TROPONINI in the last 72 hours. Invalid input(s): POCBNP No results for input(s): DDIMER in the last 72 hours. Recent Labs    07/28/20 1205  HGBA1C 7.5*   No results for input(s): CHOL, HDL, LDLCALC, TRIG, CHOLHDL, LDLDIRECT in the last 72 hours. No results for input(s): TSH, T4TOTAL, T3FREE, THYROIDAB in the last 72 hours.  Invalid input(s): FREET3 No results for input(s): VITAMINB12, FOLATE, FERRITIN, TIBC, IRON, RETICCTPCT in the last 72 hours. Coags: No results for input(s): INR in the last 72 hours.  Invalid input(s): PT Microbiology: Recent Results (from the past 240 hour(s))  SARS CORONAVIRUS 2 (TAT 6-24 HRS) Nasopharyngeal Nasopharyngeal Swab     Status: None   Collection Time: 07/27/20  4:03 PM   Specimen: Nasopharyngeal Swab  Result Value Ref Range Status   SARS Coronavirus 2 NEGATIVE NEGATIVE Final  Comment: (NOTE) SARS-CoV-2 target nucleic acids are NOT DETECTED.  The SARS-CoV-2 RNA is generally detectable in upper and lower respiratory specimens during the acute phase of infection. Negative results do not preclude SARS-CoV-2 infection, do not rule out co-infections with other pathogens, and should not be used as the sole basis for treatment or other patient management decisions. Negative results must be combined with clinical observations, patient history, and epidemiological information. The expected result is Negative.  Fact Sheet for Patients: HairSlick.no  Fact Sheet for Healthcare Providers: quierodirigir.com  This test is not yet approved or cleared by the [place] FDA and  has been authorized for detection and/or diagnosis of SARS-CoV-2  by FDA under an Emergency Use Authorization (EUA). This EUA will remain  in effect (meaning this test can be used) for the duration of the COVID-19 declaration under Se ction 564(b)(1) of the Act, 21 U.S.C. section 360bbb-3(b)(1), unless the authorization is terminated or revoked sooner.  Performed at [facility], [address], [place], [place] [address]     FURTHER DISCHARGE INSTRUCTIONS:  Get Medicines reviewed and adjusted: Please take all your medications with you for your next visit with your Primary MD  Laboratory/radiological data: Please request your Primary MD to go over all hospital tests and procedure/radiological results at the follow up, please ask your Primary MD to get all Hospital records sent to his/her office.  In some cases, they will be blood work, cultures and biopsy results pending at the time of your discharge. Please request that your primary care M.D. goes through all the records of your hospital data and follows up on these results.  Also Note the following: If you experience worsening of your admission symptoms, develop shortness of breath, life threatening emergency, suicidal or homicidal thoughts you must seek medical attention immediately by calling 911 or calling your MD immediately  if symptoms less severe.  You must read complete instructions/literature along with all the possible adverse reactions/side effects for all the Medicines you take and that have been prescribed to you. Take any new Medicines after you have completely understood and accpet all the possible adverse reactions/side effects.   Do not drive when taking Pain medications or sleeping medications (Benzodaizepines)  Do not take more than prescribed Pain, Sleep and Anxiety Medications. It is not advisable to combine anxiety,sleep and pain medications without talking with your primary care practitioner  Special Instructions: If you have smoked or chewed Tobacco  in the last 2 yrs  please stop smoking, stop any regular Alcohol  and or any Recreational drug use.  Wear Seat belts while driving.  Please note: You were cared for by a hospitalist during your hospital stay. Once you are discharged, your primary care physician will handle any further medical issues. Please note that NO REFILLS for any discharge medications will be authorized once you are discharged, as it is imperative that you return to your primary care physician (or establish a relationship with a primary care physician if you do not have one) for your post hospital discharge needs so that they can reassess your need for medications and monitor your lab values.  Total Time spent coordinating discharge including counseling, education and face to face time equals 35 minutes.  SignedJeoffrey Massed 07/29/2020 7:55 AM

## 2020-07-29 NOTE — Progress Notes (Signed)
RN went over pt d/c summary and removed PIV. Belongings with pt. Pt's mom to transport him home. NT transporting pt to private vehicle.

## 2020-08-04 ENCOUNTER — Other Ambulatory Visit: Payer: Self-pay

## 2020-08-04 ENCOUNTER — Ambulatory Visit: Payer: 59 | Admitting: Family Medicine

## 2020-08-04 ENCOUNTER — Encounter: Payer: Self-pay | Admitting: Family Medicine

## 2020-08-04 VITALS — BP 126/74 | HR 77 | Temp 98.2°F | Resp 16 | Ht 72.0 in | Wt >= 6400 oz

## 2020-08-04 DIAGNOSIS — I10 Essential (primary) hypertension: Secondary | ICD-10-CM

## 2020-08-04 DIAGNOSIS — I2699 Other pulmonary embolism without acute cor pulmonale: Secondary | ICD-10-CM

## 2020-08-04 DIAGNOSIS — I700 Atherosclerosis of aorta: Secondary | ICD-10-CM

## 2020-08-04 LAB — COMPREHENSIVE METABOLIC PANEL
ALT: 14 U/L (ref 0–53)
AST: 26 U/L (ref 0–37)
Albumin: 3.8 g/dL (ref 3.5–5.2)
Alkaline Phosphatase: 53 U/L (ref 39–117)
BUN: 14 mg/dL (ref 6–23)
CO2: 23 mEq/L (ref 19–32)
Calcium: 8.9 mg/dL (ref 8.4–10.5)
Chloride: 104 mEq/L (ref 96–112)
Creatinine, Ser: 0.74 mg/dL (ref 0.40–1.50)
GFR: 102.17 mL/min (ref >60.00)
Glucose, Bld: 127 mg/dL — ABNORMAL HIGH (ref 70–99)
Potassium: 4 mEq/L (ref 3.5–5.1)
Sodium: 135 mEq/L (ref 135–145)
Total Bilirubin: 0.8 mg/dL (ref 0.2–1.2)
Total Protein: 7.7 g/dL (ref 6.0–8.3)

## 2020-08-04 LAB — CBC
HCT: 43.1 % (ref 39.0–52.0)
Hemoglobin: 14.8 g/dL (ref 13.0–17.0)
MCHC: 34.2 g/dL (ref 30.0–36.0)
MCV: 88.8 fl (ref 78.0–100.0)
Platelets: 202 10*3/uL (ref 150.0–400.0)
RBC: 4.86 Mil/uL (ref 4.22–5.81)
RDW: 14.5 % (ref 11.5–15.5)
WBC: 7.9 10*3/uL (ref 4.0–10.5)

## 2020-08-04 NOTE — Patient Instructions (Signed)
A few things to remember from today's visit:   Atherosclerosis of aorta (HCC)  Essential hypertension, benign - Plan: Comprehensive metabolic panel, CBC  Bilateral pulmonary embolism (HCC) - Plan: Comprehensive metabolic panel, CBC  If you need refills please call your pharmacy. Do not use My Chart to request refills or for acute issues that need immediate attention.   No changes today. Continue working on weight loss though a healthful diet and low impact regular physical activity.   Please be sure medication list is accurate. If a new problem present, please set up appointment sooner than planned today.

## 2020-08-04 NOTE — Progress Notes (Signed)
HPI: [name] is a 55 y.o. male, who is here today to follow on recent hospitalization.  Hospitalized from 07/27/20 to 07/29/20. Transition of care phone call was not done. Last seen here in the office on 07/24/20. Because 2 -3 weeks of SOB and prior hx of PE, D dimer stat was ordered, apparently critical lab results was not reported. He presented to the ER on date of admission because worsening SOB. Symptoms have greatly improved.  LE vascular US: Findings consistent with age indeterminate deep vein thrombosis involving the left popliteal vein.  No cystic structure found in the popliteal fossa.  Chest CTA: 1. Multiple pulmonary emboli in a distribution similar to prior study. It is difficult to ascertain how much of the burden of pulmonary embolus currently is acute versus chronic. Acute pulmonary embolus must be of concern given the appearance of the current pulmonary emboli. Specifically, these pulmonary emboli show diffuse decreased attenuation suggesting acute pulmonary embolus. There is no pulmonary arterial vessels sclerosis or narrowing.  Echo: LVEF 60-65%  1. Technically difficult study with limited views   2. Left ventricular ejection fraction, by estimation, is 60 to 65%. The left ventricle has normal function. The left ventricle has no regional wall motion abnormalities.   3. Right ventricular systolic function was not well visualized. The right ventricular size is not well visualized.   4. The mitral valve is grossly normal. No evidence of mitral valve  regurgitation.   5. The aortic valve was not well visualized. Aortic valve regurgitation is not visualized.   He is on Eliquis 10 mg bid to complete 7 days then he will continue 5 mg bid. He has tolerated medication ell.  Negative for chest pain, dyspnea, palpitation, claudication, focal weakness, or worsening LE edema. He has not noted orthopnea or PND. OSA on CPAP.  HTN on HCTZ 25 mg daily and  Olmesartan 20 mg daily.  Lab Results  Component Value Date   CREATININE 0.90 07/28/2020   BUN 11 07/28/2020   NA 133 (L) 07/28/2020   K 4.0 07/28/2020   CL 99 07/28/2020   CO2 25 07/28/2020   Lab Results  Component Value Date   WBC 9.6 07/29/2020   HGB 14.6 07/29/2020   HCT 42.3 07/29/2020   MCV 88.9 07/29/2020   PLT 223 07/29/2020   Lab Results  Component Value Date   DDIMER 3.86 (H) 07/24/2020   HLD on Atorvastatin 20 mg daily. Aortic atherosclerosis seen on chest CTA.  Review of Systems  Constitutional:  Negative for activity change, appetite change and fever.  HENT:  Negative for mouth sores, nosebleeds and sore throat.   Eyes:  Negative for redness and visual disturbance.  Respiratory:  Negative for cough and wheezing.   Gastrointestinal:  Negative for abdominal pain, nausea and vomiting.  Genitourinary:  Negative for decreased urine volume, dysuria and hematuria.  Neurological:  Negative for syncope, weakness and headaches.  Rest see pertinent positives and negatives per HPI.  Current Outpatient Medications on File Prior to Visit  Medication Sig Dispense Refill   allopurinol (ZYLOPRIM) 300 MG tablet Take 1 tablet (300 mg total) by mouth daily. 90 tablet 3   APIXABAN (ELIQUIS) VTE STARTER PACK (10MG  AND 5MG ) Take as directed on package: start with two-5mg  tablets twice daily for 7 days. On day 8, switch to one-5mg  tablet twice daily. 74 each 0   atorvastatin (LIPITOR) 20 MG tablet Take 1 tablet (20 mg total) by mouth daily. 90 tablet 3  hydrochlorothiazide (HYDRODIURIL) 25 MG tablet Take 1 tablet (25 mg total) by mouth daily. 90 tablet 2   olmesartan (BENICAR) 20 MG tablet TAKE 1 TABLET BY MOUTH EVERY DAY (MAIN. RETAIL FILLS EXCEEDED) 30 tablet 8   Semaglutide,0.25 or 0.5MG /DOS, (OZEMPIC, 0.25 OR 0.5 MG/DOSE,) 2 MG/1.5ML SOPN Inject 1 mg into the skin once a week. 1.5 mL 2   Triamcinolone Acetonide (TRIAMCINOLONE 0.1 % CREAM : EUCERIN) CREA Apply 1 application  topically 2 (two) times daily as needed for rash or itching. 500 each 1   No current facility-administered medications on file prior to visit.    Past Medical History:  Diagnosis Date   Arthritis    Lower back pain   Gout    Hypertension    Morbid obesity with BMI of 50.0-59.9, adult (HCC)    Pulmonary embolism (HCC)    No Known Allergies  Social History   Socioeconomic History   Marital status: Married    Spouse name: Not on file   Number of children: Not on file   Years of education: Not on file   Highest education level: Not on file  Occupational History   Occupation: Engineer, water  Tobacco Use   Smoking status: Never   Smokeless tobacco: Never  Substance and Sexual Activity   Alcohol use: Yes    Comment: occasional   Drug use: No   Sexual activity: Yes  Other Topics Concern   Not on file  Social History Narrative   Not on file   Social Determinants of Health   Financial Resource Strain: Not on file  Food Insecurity: Not on file  Transportation Needs: Not on file  Physical Activity: Not on file  Stress: Not on file  Social Connections: Not on file    Vitals:   08/04/20 0829  BP: 126/74  Pulse: 77  Resp: 16  Temp: 98.2 F (36.8 C)  SpO2: 97%   Body mass index is 55.58 kg/m.  Physical Exam Nursing note reviewed.  Constitutional:      General: He is not in acute distress.    Appearance: He is well-developed.  HENT:     Head: Normocephalic and atraumatic.  Eyes:     Conjunctiva/sclera: Conjunctivae normal.  Cardiovascular:     Rate and Rhythm: Normal rate and regular rhythm.     Pulses:          Posterior tibial pulses are 2+ on the right side and 2+ on the left side.     Heart sounds: No murmur heard.    Comments: Lymphedema LE, bilateral. Pulmonary:     Effort: Pulmonary effort is normal. No respiratory distress.     Breath sounds: Normal breath sounds.  Abdominal:     Palpations: Abdomen is soft. There is no hepatomegaly or mass.      Tenderness: There is no abdominal tenderness.  Musculoskeletal:     Right lower leg: 1+ Pitting Edema present.     Left lower leg: 1+ Pitting Edema present.  Lymphadenopathy:     Cervical: No cervical adenopathy.  Skin:    General: Skin is warm.     Findings: No erythema or rash.  Neurological:     Mental Status: He is alert and oriented to person, place, and time.     Cranial Nerves: No cranial nerve deficit.     Gait: Gait normal.  Psychiatric:     Comments: Well groomed, good eye contact.   ASSESSMENT AND PLAN:  [name] was seen today for  hospitalization follow-up.  Diagnoses and all orders for this visit:  Orders Placed This Encounter  Procedures   Comprehensive metabolic panel   CBC   Lab Results  Component Value Date   WBC 7.9 08/04/2020   HGB 14.8 08/04/2020   HCT 43.1 08/04/2020   MCV 88.8 08/04/2020   PLT 202.0 08/04/2020   Lab Results  Component Value Date   CREATININE 0.74 08/04/2020   BUN 14 08/04/2020   NA 135 08/04/2020   K 4.0 08/04/2020   CL 104 08/04/2020   CO2 23 08/04/2020   Lab Results  Component Value Date   ALT 14 08/04/2020   AST 26 08/04/2020   ALKPHOS 53 08/04/2020   BILITOT 0.8 08/04/2020   Bilateral pulmonary embolism (HCC) Continue Eliquis 10 mg bid until competing 7 days then 5 mg bid, lifetime anticoagulation. We discussed some side effects of medications. Instructed about warning signs.  Atherosclerosis of aorta (HCC) Seen on imaging. Continue Atorvastatin 20 mg daily.  Essential hypertension, benign BP adequately controlled. Continue HCTZ 25 mg daily and Olmesartan 20 mg daily. Monitor BP at home.  Return in about 4 months (around 12/04/2020).   Josie Mesa G. [place], MD  [facility]. [place] office.    A few things to remember from today's visit:   Atherosclerosis of aorta (HCC)  Essential hypertension, benign - Plan: Comprehensive metabolic panel, CBC  Bilateral pulmonary embolism (HCC) -  Plan: Comprehensive metabolic panel, CBC  If you need refills please call your pharmacy. Do not use My Chart to request refills or for acute issues that need immediate attention.   No changes today. Continue working on weight loss though a healthful diet and low impact regular physical activity.   Please be sure medication list is accurate. If a new problem present, please set up appointment sooner than planned today.

## 2020-08-05 ENCOUNTER — Telehealth (HOSPITAL_COMMUNITY): Payer: Self-pay

## 2020-08-05 ENCOUNTER — Other Ambulatory Visit (HOSPITAL_COMMUNITY): Payer: Self-pay

## 2020-08-05 NOTE — Telephone Encounter (Signed)
Pharmacy Transitions of Care Follow-up Telephone Call  Date of discharge: 07/29/20  Discharge Diagnosis: acute PE  How have you been since you were released from the hospital?   Patient doing well and has already seen PCP since discharge.   Medication changes made at discharge:           START taking: Eliquis DVT/PE Starter Pack (Apixaban Starter Pack (10mg  and 5mg ))   CHANGE how you take: olmesartan (BENICAR)   STOP taking: aspirin EC 81 MG tablet   Medication changes verified by the patient? Yes    Medication Accessibility:  [facility] [place]  Was the patient provided with refills on discharged medications? No   Have all prescriptions been transferred from [facility] to home pharmacy? N/A   Is the patient able to afford medications? Patient has healthcare    Medication Review: APIXABAN (ELIQUIS)  Apixaban 10 mg BID initiated on 07/29/20. Will switch to apixaban 5 mg BID after 7 days (DATE 08/05/20).  - Discussed importance of taking medication around the same time everyday  - Reviewed potential DDIs with patient  - Advised patient of medications to avoid (NSAIDs, ASA)  - Educated that Tylenol (acetaminophen) will be the preferred analgesic to prevent risk of bleeding  - Emphasized importance of monitoring for signs and symptoms of bleeding (abnormal bruising, prolonged bleeding, nose bleeds, bleeding from gums, discolored urine, black tarry stools)  - Advised patient to alert all providers of anticoagulation therapy prior to starting a new medication or having a procedure   Follow-up Appointments:  PCP Hospital f/u appt confirmed? 08/04/20 and 11/23/20 Family Medicine with Dr. 08/06/20  Specialist Hospital f/u appt confirmed? none  If their condition worsens, is the pt aware to call PCP or go to the Emergency Dept.? yes  Final Patient Assessment: Patient doing well. Has refills at home pharmacy and follow ups scheduled and completed.

## 2020-08-25 ENCOUNTER — Telehealth: Payer: Self-pay | Admitting: Family Medicine

## 2020-08-25 DIAGNOSIS — E1169 Type 2 diabetes mellitus with other specified complication: Secondary | ICD-10-CM

## 2020-08-25 MED ORDER — APIXABAN 5 MG PO TABS: 5.0000 mg | ORAL_TABLET | Freq: Two times a day (BID) | ORAL | 11 refills | Status: DC

## 2020-08-25 MED ORDER — OZEMPIC (0.25 OR 0.5 MG/DOSE) 2 MG/1.5ML ~~LOC~~ SOPN: 1.0000 mg | PEN_INJECTOR | SUBCUTANEOUS | 2 refills | Status: DC

## 2020-08-25 NOTE — Telephone Encounter (Signed)
Patient was prescribed Semaglutide,0.25 or 0.5MG /DOS, (OZEMPIC, 0.25 OR 0.5 MG/DOSE,) 2 MG/1.5ML SOPN on 06/10 and has not [facility] stating that [facility] was ready for pick up. So he has never picked this medicaton up and would like to know if Dr. [place] can resend this medication.   He was also in the hospital 06/13 and was prescribed this medication and needs a refill APIXABAN (ELIQUIS) VTE STARTER PACK (10MG  AND 5MG )   [facility] [place] [facility] [address] Phone:  [phone]  Fax:  [phone]

## 2020-08-25 NOTE — Telephone Encounter (Signed)
Rx's sent in. °

## 2020-09-15 ENCOUNTER — Other Ambulatory Visit: Payer: Self-pay

## 2020-11-22 NOTE — Progress Notes (Signed)
HPI: [name] is a 55 y.o. male, who is here today for 4 months follow up.   He was last seen on 08/04/20. No new problems since his last visit.  Diabetes Mellitus II: Dx'ed in 10/2017. - Checking BG at home: Not checking - Medications: Ozempic 1 mg weekly, he has not taken it for a couple of weeks, his health insurance is not longer covering it. - Diet: Decreased food intake late at night and Ozempic helped with appetite. - Exercise: He is more active at work and yard work but he does not have an exercise routine. - eye exam: 03/2019. - foot exam: 10/2019. Medication was causing constipation and bloating sensation. - Negative for symptoms of hypoglycemia, polyuria, polydipsia, numbness extremities, foot ulcers/trauma  He has noted that his clothes fit better. He is not weighing at home.  Lab Results  Component Value Date   HGBA1C 7.5 (H) 07/28/2020   Lab Results  Component Value Date   MICROALBUR 0.5 11/05/2019   Hypertension:  Medications: Olmesartan 20 mg daily and HCTZ 25 mg daily. BP readings at home:Occasionally and readings similar to today BP. Side effects:None.  Negative for unusual or severe headache, visual changes, exertional chest pain, dyspnea,  focal weakness, or worsening edema. Bilateral lower extremity lymphedema. He is wearing compression stockings. He uses triamcinolone cream twice daily as needed for venous stasis dermatitis.  Lab Results  Component Value Date   CREATININE 0.74 08/04/2020   BUN 14 08/04/2020   NA 135 08/04/2020   K 4.0 08/04/2020   CL 104 08/04/2020   CO2 23 08/04/2020   Hyperlipidemia: Currently he is on atorvastatin 20 mg daily. Tolerated medication well.  Lab Results  Component Value Date   CHOL 181 11/05/2019   HDL 46 11/05/2019   LDLCALC 116 (H) 11/05/2019   TRIG 93 11/05/2019   CHOLHDL 3.9 11/05/2019   Review of Systems  Constitutional:  Negative for activity change, appetite change, fatigue and  fever.  HENT:  Negative for mouth sores, nosebleeds, sore throat and trouble swallowing.   Respiratory:  Negative for cough and wheezing.   Gastrointestinal:  Negative for abdominal pain, nausea and vomiting.  Genitourinary:  Negative for decreased urine volume and hematuria.  Musculoskeletal:  Negative for gait problem and myalgias.  Neurological:  Negative for syncope, facial asymmetry and weakness.  Rest of ROS, see pertinent positives sand negatives in HPI  Current Outpatient Medications on File Prior to Visit  Medication Sig Dispense Refill   allopurinol (ZYLOPRIM) 300 MG tablet Take 1 tablet (300 mg total) by mouth daily. 90 tablet 3   atorvastatin (LIPITOR) 20 MG tablet Take 1 tablet (20 mg total) by mouth daily. 90 tablet 3   hydrochlorothiazide (HYDRODIURIL) 25 MG tablet Take 1 tablet (25 mg total) by mouth daily. 90 tablet 2   olmesartan (BENICAR) 20 MG tablet TAKE 1 TABLET BY MOUTH EVERY DAY (MAIN. RETAIL FILLS EXCEEDED) 30 tablet 8   No current facility-administered medications on file prior to visit.   Past Medical History:  Diagnosis Date   Arthritis    Lower back pain   Gout    Hypertension    Morbid obesity with BMI of 50.0-59.9, adult (HCC)    Pulmonary embolism (HCC)    No Known Allergies  Social History   Socioeconomic History   Marital status: Married    Spouse name: Not on file   Number of children: Not on file   Years of education: Not on file  Highest education level: Not on file  Occupational History   Occupation: Engineer, water  Tobacco Use   Smoking status: Never   Smokeless tobacco: Never  Substance and Sexual Activity   Alcohol use: Yes    Comment: occasional   Drug use: No   Sexual activity: Yes  Other Topics Concern   Not on file  Social History Narrative   Not on file   Social Determinants of Health   Financial Resource Strain: Not on file  Food Insecurity: Not on file  Transportation Needs: Not on file  Physical Activity: Not  on file  Stress: Not on file  Social Connections: Not on file   Vitals:   11/23/20 0706  BP: 128/80  Pulse: 77  Resp: 16  Temp: 98.3 F (36.8 C)  SpO2: 99%   Wt Readings from Last 3 Encounters:  11/23/20 (!) 409 lb 6 oz (185.7 kg)  08/04/20 (!) 409 lb 12.8 oz (185.9 kg)  07/28/20 (!) 399 lb 11.1 oz (181.3 kg)   Body mass index is 55.52 kg/m.  Physical Exam Nursing note reviewed.  Constitutional:      General: He is not in acute distress.    Appearance: He is well-developed.  HENT:     Head: Normocephalic and atraumatic.  Eyes:     Conjunctiva/sclera: Conjunctivae normal.  Cardiovascular:     Rate and Rhythm: Normal rate and regular rhythm.     Pulses:          Dorsalis pedis pulses are 2+ on the right side and 2+ on the left side.     Heart sounds: No murmur heard.    Comments: Lower extremity lymphedema, bilateral. Compression stockings on. Pulmonary:     Effort: Pulmonary effort is normal. No respiratory distress.     Breath sounds: Normal breath sounds.  Abdominal:     Palpations: Abdomen is soft. There is no hepatomegaly or mass.     Tenderness: There is no abdominal tenderness.  Musculoskeletal:     Right lower leg: 1+ Pitting Edema present.     Left lower leg: 1+ Pitting Edema present.  Lymphadenopathy:     Cervical: No cervical adenopathy.  Skin:    General: Skin is warm.     Findings: No erythema or rash.  Neurological:     Mental Status: He is alert and oriented to person, place, and time.     Cranial Nerves: No cranial nerve deficit.     Gait: Gait normal.  Psychiatric:     Comments: Well groomed, good eye contact.   ASSESSMENT AND PLAN:  [name] was seen today for 4 months follow-up.  Orders Placed This Encounter  Procedures   Flu Vaccine QUAD 31mo+IM (Fluarix, Fluzone & Alfiuria Quad PF)   Basic metabolic panel   Lipid panel   Microalbumin / creatinine urine ratio   POC HgB A1c   Lab Results  Component Value Date    HGBA1C 6.3 (A) 11/23/2020   Lab Results  Component Value Date   MICROALBUR <0.7 11/23/2020   MICROALBUR 0.5 11/05/2019   Lab Results  Component Value Date   CHOL 143 11/23/2020   HDL 40.40 11/23/2020   LDLCALC 85 11/23/2020   TRIG 88.0 11/23/2020   CHOLHDL 4 11/23/2020   Lab Results  Component Value Date   CREATININE 0.76 11/23/2020   BUN 10 11/23/2020   NA 137 11/23/2020   K 3.9 11/23/2020   CL 103 11/23/2020   CO2 24 11/23/2020  Type 2 diabetes mellitus with other specified complication (HCC) HgA1C at goal. Ozempic is not longer covered by his health insurance, samples of Trulicity 1.5 mg to continue weekly given today.  He will let us know about tolerance, so we can send prescription to his pharmacy. Annual eye exam, periodic dental and foot care recommended. F/U in 5-6 months  Essential hypertension, benign BP adequately controlled. Continue current management: Benicar 20 mg daily and HCTZ 25 mg daily. DASH/low salt diet to continue. Monitor BP at home. Eye exam is due.  Hyperlipidemia associated with type 2 diabetes mellitus (HCC) Continue Atorvastatin 20 mg daily and low fat diet. Further recommendations according to FLP results.   Bilateral pulmonary embolism (HCC) Tolerating medication well. Continue Eliquis 5 mg bid. Some side effects discussed.  Morbid obesity with BMI of 50.0-59.9, adult (HCC) He has lost waist inches. We discussed benefits of wt loss. Consistency with healthy diet and physical activity encouraged. Daily brisk walking for 15-30 min as tolerated.  Venous stasis dermatitis of both lower extremities Problem is stable. Continue topical triamcinolone twice daily as needed and appropriate skin care. We discussed some side effects of chronic topical steroid. Continue wearing compression stockings.  Need for influenza vaccination - Flu Vaccine QUAD 81mo+IM (Fluarix, Fluzone & Alfiuria Quad PF)  I spent a total of 40 minutes in both  face to face and non face to face activities for this visit on the date of this encounter. During this time history was obtained and documented, examination was performed, prior labs reviewed, and assessment/plan discussed.  Return in about 5 months (around 04/23/2021) for DM II,HTN.   Rosalena Mccorry G. [place], MD  [facility]. [place] office.

## 2020-11-23 ENCOUNTER — Ambulatory Visit: Payer: BC Managed Care – PPO | Admitting: Family Medicine

## 2020-11-23 ENCOUNTER — Encounter: Payer: Self-pay | Admitting: Family Medicine

## 2020-11-23 ENCOUNTER — Other Ambulatory Visit: Payer: Self-pay

## 2020-11-23 VITALS — BP 128/80 | HR 77 | Temp 98.3°F | Resp 16 | Ht 72.0 in | Wt >= 6400 oz

## 2020-11-23 DIAGNOSIS — E785 Hyperlipidemia, unspecified: Secondary | ICD-10-CM

## 2020-11-23 DIAGNOSIS — I872 Venous insufficiency (chronic) (peripheral): Secondary | ICD-10-CM

## 2020-11-23 DIAGNOSIS — I10 Essential (primary) hypertension: Secondary | ICD-10-CM | POA: Diagnosis not present

## 2020-11-23 DIAGNOSIS — Z23 Encounter for immunization: Secondary | ICD-10-CM | POA: Diagnosis not present

## 2020-11-23 DIAGNOSIS — E1169 Type 2 diabetes mellitus with other specified complication: Secondary | ICD-10-CM

## 2020-11-23 DIAGNOSIS — I2699 Other pulmonary embolism without acute cor pulmonale: Secondary | ICD-10-CM

## 2020-11-23 DIAGNOSIS — Z6841 Body Mass Index (BMI) 40.0 and over, adult: Secondary | ICD-10-CM

## 2020-11-23 LAB — BASIC METABOLIC PANEL
BUN: 10 mg/dL (ref 6–23)
CO2: 24 mEq/L (ref 19–32)
Calcium: 8.9 mg/dL (ref 8.4–10.5)
Chloride: 103 mEq/L (ref 96–112)
Creatinine, Ser: 0.76 mg/dL (ref 0.40–1.50)
GFR: 101.14 mL/min (ref >60.00)
Glucose, Bld: 120 mg/dL — ABNORMAL HIGH (ref 70–99)
Potassium: 3.9 mEq/L (ref 3.5–5.1)
Sodium: 137 mEq/L (ref 135–145)

## 2020-11-23 LAB — LIPID PANEL
Cholesterol: 143 mg/dL (ref 0–200)
HDL: 40.4 mg/dL (ref >39.00)
LDL Cholesterol: 85 mg/dL (ref 0–99)
NonHDL: 102.12
Total CHOL/HDL Ratio: 4
Triglycerides: 88 mg/dL (ref 0.0–149.0)
VLDL: 17.6 mg/dL (ref 0.0–40.0)

## 2020-11-23 LAB — POCT GLYCOSYLATED HEMOGLOBIN (HGB A1C): Hemoglobin A1C: 6.3 % — AB (ref 4.0–5.6)

## 2020-11-23 LAB — MICROALBUMIN / CREATININE URINE RATIO
Creatinine,U: 194.2 mg/dL
Microalb Creat Ratio: 0.4 mg/g (ref 0.0–30.0)
Microalb, Ur: <0.7 mg/dL (ref 0.0–1.9)

## 2020-11-23 MED ORDER — TRULICITY 1.5 MG/0.5ML ~~LOC~~ SOAJ: 1.5000 mg | SUBCUTANEOUS | 2 refills | Status: DC

## 2020-11-23 MED ORDER — OLMESARTAN MEDOXOMIL 20 MG PO TABS: ORAL_TABLET | ORAL | 2 refills | Status: DC

## 2020-11-23 MED ORDER — HYDROCHLOROTHIAZIDE 25 MG PO TABS: 25.0000 mg | ORAL_TABLET | Freq: Every day | ORAL | 2 refills | Status: DC

## 2020-11-23 MED ORDER — ATORVASTATIN CALCIUM 20 MG PO TABS: 20.0000 mg | ORAL_TABLET | Freq: Every day | ORAL | 3 refills | Status: DC

## 2020-11-23 MED ORDER — APIXABAN 5 MG PO TABS: 5.0000 mg | ORAL_TABLET | Freq: Two times a day (BID) | ORAL | 3 refills | Status: DC

## 2020-11-23 MED ORDER — TRIAMCINOLONE 0.1 % CREAM:EUCERIN CREAM 1:1: 1.0000 "application " | TOPICAL_CREAM | Freq: Two times a day (BID) | CUTANEOUS | 1 refills | Status: DC | PRN

## 2020-11-23 NOTE — Assessment & Plan Note (Signed)
Continue Atorvastatin 20 mg daily and low fat diet. Further recommendations according to FLP results. 

## 2020-11-23 NOTE — Assessment & Plan Note (Signed)
Tolerating medication well. Continue Eliquis 5 mg bid. Some side effects discussed.

## 2020-11-23 NOTE — Assessment & Plan Note (Addendum)
HgA1C at goal. Ozempic is not longer covered by his health insurance, samples of Trulicity 1.5 mg to continue weekly given today.  He will let us know about tolerance, so we can send prescription to his pharmacy. Annual eye exam, periodic dental and foot care recommended. F/U in 5-6 months

## 2020-11-23 NOTE — Patient Instructions (Addendum)
A few things to remember from today's visit:  Type 2 diabetes mellitus with other specified complication, without long-term current use of insulin (HCC) - Plan: POC HgB A1c  Essential hypertension, benign  Bilateral pulmonary embolism (HCC)  Today we changed Ozempic to Trulicity, also weekly. Rest no changes. Try to walk 15 min daily.  If you need refills please call your pharmacy. Do not use My Chart to request refills or for acute issues that need immediate attention.    Please be sure medication list is accurate. If a new problem present, please set up appointment sooner than planned today.

## 2020-11-23 NOTE — Assessment & Plan Note (Signed)
Problem is stable. Continue topical triamcinolone twice daily as needed and appropriate skin care. We discussed some side effects of chronic topical steroid. Continue wearing compression stockings.

## 2020-11-23 NOTE — Assessment & Plan Note (Signed)
He has lost waist inches. We discussed benefits of wt loss. Consistency with healthy diet and physical activity encouraged. Daily brisk walking for 15-30 min as tolerated.

## 2020-11-23 NOTE — Assessment & Plan Note (Addendum)
BP adequately controlled. Continue current management: Benicar 20 mg daily and HCTZ 25 mg daily. DASH/low salt diet to continue. Monitor BP at home. Eye exam is due.

## 2021-03-05 ENCOUNTER — Encounter: Payer: Self-pay | Admitting: Family Medicine

## 2021-03-05 ENCOUNTER — Telehealth (INDEPENDENT_AMBULATORY_CARE_PROVIDER_SITE_OTHER): Payer: BC Managed Care – PPO | Admitting: Family Medicine

## 2021-03-05 VITALS — HR 67 | Temp 98.0°F | Ht 72.0 in

## 2021-03-05 DIAGNOSIS — U071 COVID-19: Secondary | ICD-10-CM | POA: Diagnosis not present

## 2021-03-05 MED ORDER — MOLNUPIRAVIR EUA 200MG CAPSULE: 4.0000 | ORAL_CAPSULE | Freq: Two times a day (BID) | ORAL | 0 refills | Status: AC

## 2021-03-05 NOTE — Progress Notes (Signed)
Virtual Visit via Video Note I connected with Earl Donovan on 03/05/21 by a video enabled telemedicine application and verified that I am speaking with the correct person using two identifiers.  Location patient: home Location provider:work office Persons participating in the virtual visit: patient, provider  I discussed the limitations of evaluation and management by telemedicine and the availability of in person appointments. The patient expressed understanding and agreed to proceed.  Chief Complaint  Patient presents with   Covid Positive   HPI: Earl Donovan is a 56 yo male with hx of HTN,DVT/PE on chronic anticoagulation, HLD, and DM II c/o 1-2 days of respiratory symptoms. 1 to 2 days ago he noted lower back achy pain at bedtime, next day it felt better but started with fever,chills, nasal congestion, rhinorrhea, postnasal drainage, sore throat, cough, and body aches. Max temp 102.0, today 98-99 F.  Cough is mainly nonproductive, negative for hemoptysis.  Mild "winded" when carrying groceries yesterday.  Negative for anosmia,ageusia,headache, CP, palpitations, diaphoresis, abdominal pain, changes in bowel habits, nausea, vomiting, urinary symptoms, or skin rash. Last night positive home COVID 19 test. No known sick contact. No COVID 19 vaccination.  Checking pulse O2 regularly, high 90's.  He has been taking DayQuil and NyQuil.  ROS: See pertinent positives and negatives per HPI.  Past Medical History:  Diagnosis Date   Arthritis    Lower back pain   Gout    Hypertension    Morbid obesity with BMI of 50.0-59.9, adult (HCC)    Pulmonary embolism (HCC)    History reviewed. No pertinent surgical history.  Family History  Problem Relation Age of Onset   Arthritis Mother    Cancer Mother    Cancer Father    Asthma Maternal Grandmother    Asthma Maternal Grandfather    Diabetes Daughter    Clotting disorder Neg Hx     Social History   Socioeconomic  History   Marital status: Married    Spouse name: Not on file   Number of children: Not on file   Years of education: Not on file   Highest education level: Not on file  Occupational History   Occupation: Engineer, water  Tobacco Use   Smoking status: Never   Smokeless tobacco: Never  Substance and Sexual Activity   Alcohol use: Yes    Comment: occasional   Drug use: No   Sexual activity: Yes  Other Topics Concern   Not on file  Social History Narrative   Not on file   Social Determinants of Health   Financial Resource Strain: Not on file  Food Insecurity: Not on file  Transportation Needs: Not on file  Physical Activity: Not on file  Stress: Not on file  Social Connections: Not on file  Intimate Partner Violence: Not on file   Current Outpatient Medications:    allopurinol (ZYLOPRIM) 300 MG tablet, Take 1 tablet (300 mg total) by mouth daily., Disp: 90 tablet, Rfl: 3   apixaban (ELIQUIS) 5 MG TABS tablet, Take 1 tablet (5 mg total) by mouth 2 (two) times daily., Disp: 180 tablet, Rfl: 3   atorvastatin (LIPITOR) 20 MG tablet, Take 1 tablet (20 mg total) by mouth daily., Disp: 90 tablet, Rfl: 3   Dulaglutide (TRULICITY) 1.5 MG/0.5ML SOPN, Inject 1.5 mg into the skin once a week., Disp: 6 mL, Rfl: 2   hydrochlorothiazide (HYDRODIURIL) 25 MG tablet, Take 1 tablet (25 mg total) by mouth daily., Disp: 90 tablet, Rfl: 2   olmesartan (  BENICAR) 20 MG tablet, TAKE 1 TABLET BY MOUTH EVERY DAY (MAIN. RETAIL FILLS EXCEEDED), Disp: 90 tablet, Rfl: 2   Triamcinolone Acetonide (TRIAMCINOLONE 0.1 % CREAM : EUCERIN) CREA, Apply 1 application topically 2 (two) times daily as needed for rash or itching., Disp: 500 each, Rfl: 1  EXAM:  VITALS per patient if applicable:Pulse 67    Temp 98 F (36.7 C)    Ht 6' (1.829 m)    SpO2 97%    BMI 55.52 kg/m   GENERAL: alert, oriented, appears well and in no acute distress  HEENT: atraumatic, conjunctiva clear, no obvious abnormalities on inspection  of external nose and ears  NECK: normal movements of the head and neck  LUNGS: on inspection no signs of respiratory distress, breathing rate appears normal, no obvious gross SOB, gasping or wheezing  CV: no obvious cyanosis  MS: moves all visible extremities without noticeable abnormality  PSYCH/NEURO: pleasant and cooperative, no obvious depression or anxiety, speech and thought processing grossly intact  ASSESSMENT AND PLAN:  Discussed the following assessment and plan:  COVID-19 virus infection - Plan: molnupiravir EUA (LAGEVRIO) 200 mg CAPS capsule We discussed Dx,possible complications and treatment options. He has a mild to moderate case with risk for complications. We discussed oral antiviral options and side effects. He agrees with trying Molnupiravir.. Symptomatic treatment with plenty of fluids,rest,tylenol 500 mg 3-4 times per day prn. Complete 7 days of quarantine , starting from symptoms onset. He will let me know if he needs a note for work. Explained that cough and congestion may last a few more days and even weeks after acute symptoms have resolved. Clearly instructed about warning signs.  Recommend monitoring BP and HR + pulse O2 regularly. Cough may become more productive, in which case he can try plain Mucinex.  We discussed possible serious and likely etiologies, options for evaluation and workup, limitations of telemedicine visit vs in person visit, treatment, treatment risks and precautions. The patient was advised to call back or seek an in-person evaluation if the symptoms worsen or if the condition fails to improve as anticipated. I discussed the assessment and treatment plan with the patient. The patient was provided an opportunity to ask questions and all were answered. The patient agreed with the plan and demonstrated an understanding of the instructions.  No follow-ups on file. [name], MD  [facility]. [place] office.

## 2021-04-21 NOTE — Progress Notes (Signed)
HPI: [name] is a 56 y.o. male, who is here today for chronic disease management.  Last seen on 11/23/20. No new problems since his last visit.  Diabetes Mellitus II: Dx'ed in 10/2017. - Checking BG at home: Not checking. - Medications: Trulicity 1.5 mg weekly - Diet: "Little" healthier, increased water. - Exercise: Walking "some", every other day 10-15 min. - eye exam: Overdue, appt in 2 weeks. - foot exam: 11/2020 - Negative for symptoms of hypoglycemia, polyuria, polydipsia, numbness extremities, foot ulcers/trauma  Lab Results  Component Value Date   HGBA1C 6.3 (A) 11/23/2020   Lab Results  Component Value Date   MICROALBUR <0.7 11/23/2020   Hypertension:  Medications:Olmesartan 20 mg daily and HCTZ 25 mg daily. BP readings at home:Occasionally, 120's/70-80. Side effects:none Negative for unusual or severe headache, visual changes, exertional chest pain, dyspnea,  focal weakness, or worsening edema.  Lab Results  Component Value Date   CREATININE 0.76 11/23/2020   BUN 10 11/23/2020   NA 137 11/23/2020   K 3.9 11/23/2020   CL 103 11/23/2020   CO2 24 11/23/2020   Hx of  PE, he is on Eliquis 5 mg bid. 07/2020 and 07/2019. Tolerating medication well. Chest CTA 07/2020 showed multiple pulmonary emboli in a distribution similar to prior study.  LE US positive for LEFT age indeterminate deep vein thrombosis involving the left popliteal vein.   Left pretibial excoriation after hitting area 3 weeks ago, healing and no exudate. RLE ulcer has healed. He wears compression stockings daily. LE edema exacerbated by prolonged standing. Negative for calves pain or erythema.  Review of Systems  Constitutional:  Negative for activity change, appetite change and fever.  HENT:  Negative for nosebleeds and sore throat.   Respiratory:  Negative for cough and wheezing.   Gastrointestinal:  Negative for abdominal pain, nausea and vomiting.  Genitourinary:   Negative for decreased urine volume and hematuria.  Neurological:  Negative for syncope and facial asymmetry.  Rest of ROS see pertinent positives and negatives in HPI.  Current Outpatient Medications on File Prior to Visit  Medication Sig Dispense Refill   allopurinol (ZYLOPRIM) 300 MG tablet Take 1 tablet (300 mg total) by mouth daily. 90 tablet 3   apixaban (ELIQUIS) 5 MG TABS tablet Take 1 tablet (5 mg total) by mouth 2 (two) times daily. 180 tablet 3   atorvastatin (LIPITOR) 20 MG tablet Take 1 tablet (20 mg total) by mouth daily. 90 tablet 3   hydrochlorothiazide (HYDRODIURIL) 25 MG tablet Take 1 tablet (25 mg total) by mouth daily. 90 tablet 2   olmesartan (BENICAR) 20 MG tablet TAKE 1 TABLET BY MOUTH EVERY DAY (MAIN. RETAIL FILLS EXCEEDED) 90 tablet 2   Triamcinolone Acetonide (TRIAMCINOLONE 0.1 % CREAM : EUCERIN) CREA Apply 1 application topically 2 (two) times daily as needed for rash or itching. 500 each 1   No current facility-administered medications on file prior to visit.   Past Medical History:  Diagnosis Date   Arthritis    Lower back pain   Gout    Hypertension    Morbid obesity with BMI of 50.0-59.9, adult (HCC)    Pulmonary embolism (HCC)    No Known Allergies  Social History   Socioeconomic History   Marital status: Married    Spouse name: Not on file   Number of children: Not on file   Years of education: Not on file   Highest education level: Associate degree: occupational, Scientist, product/process developmenttechnical, or vocational program  Occupational History   Occupation: Engineer, water  Tobacco Use   Smoking status: Never   Smokeless tobacco: Never  Substance and Sexual Activity   Alcohol use: Yes    Comment: occasional   Drug use: No   Sexual activity: Yes  Other Topics Concern   Not on file  Social History Narrative   Not on file   Social Determinants of Health   Financial Resource Strain: Low Risk    Difficulty of Paying Living Expenses: Not very hard  Food  Insecurity: No Food Insecurity   Worried About Programme researcher, broadcasting/film/video in the Last Year: Never true   Ran Out of Food in the Last Year: Never true  Transportation Needs: No Transportation Needs   Lack of Transportation (Medical): No   Lack of Transportation (Non-Medical): No  Physical Activity: Insufficiently Active   Days of Exercise per Week: 3 days   Minutes of Exercise per Session: 20 min  Stress: No Stress Concern Present   Feeling of Stress : Only a little  Social Connections: Press photographer of Communication with Friends and Family: More than three times a week   Frequency of Social Gatherings with Friends and Family: More than three times a week   Attends Religious Services: 1 to 4 times per year   Active Member of Clubs or Organizations: Yes   Attends Banker Meetings: 1 to 4 times per year   Marital Status: Married   Vitals:   04/23/21 0733  BP: 128/70  Pulse: 89  Resp: 16  SpO2: 99%   Wt Readings from Last 3 Encounters:  04/23/21 (!) 399 lb (181 kg)  11/23/20 (!) 409 lb 6 oz (185.7 kg)  08/04/20 (!) 409 lb 12.8 oz (185.9 kg)   Body mass index is 54.11 kg/m.  Physical Exam Vitals and nursing note reviewed.  Constitutional:      General: He is not in acute distress.    Appearance: He is well-developed.  HENT:     Head: Normocephalic.     Mouth/Throat:     Mouth: Mucous membranes are moist.     Pharynx: Oropharynx is clear.  Eyes:     Conjunctiva/sclera: Conjunctivae normal.  Cardiovascular:     Rate and Rhythm: Normal rate and regular rhythm.     Pulses:          Dorsalis pedis pulses are 2+ on the right side and 2+ on the left side.     Heart sounds: No murmur heard.    Comments: Lymphedema LE's, bilateral. Pulmonary:     Effort: Pulmonary effort is normal. No respiratory distress.     Breath sounds: Normal breath sounds.  Abdominal:     Palpations: Abdomen is soft. There is no hepatomegaly or mass.     Tenderness: There is  no abdominal tenderness.  Musculoskeletal:     Right lower leg: 1+ Pitting Edema present.     Left lower leg: 1+ Pitting Edema present.  Lymphadenopathy:     Cervical: No cervical adenopathy.  Skin:    General: Skin is warm.     Findings: No erythema or rash.          Comments: LE's stasis dermatitis skin changes, bilateral.  Neurological:     Mental Status: He is alert and oriented to person, place, and time.     Cranial Nerves: No cranial nerve deficit.     Gait: Gait normal.  Psychiatric:     Comments: Well groomed,  good eye contact.   ASSESSMENT AND PLAN:  Mr. Jemar was seen today for follow-up.  Diagnoses and all orders for this visit: Orders Placed This Encounter  Procedures   Cologuard   Basic metabolic panel   POC HgB A1c   Lab Results  Component Value Date   HGBA1C 6.1 04/23/2021   Lab Results  Component Value Date   CREATININE 0.81 04/23/2021   BUN 12 04/23/2021   NA 132 (L) 04/23/2021   K 4.1 04/23/2021   CL 97 04/23/2021   CO2 26 04/23/2021   Venous ulcer of left leg (HCC) Reporting that it is slowly healing. Continue keeping area clean with soap and water. Monitor for signs of infection.  Type 2 diabetes mellitus with other specified complication (HCC) HgA1C at goal. Continue Trulicity 1.5 mg weekly. Annual eye exam, periodic dental and foot care recommended. F/U in 5-6 months   Venous stasis dermatitis of both lower extremities Stable. Continue wearing compression stockings and LE elevation a few times throughout the day. Appropriate skin care and avoidance of trauma discussed.  Morbid obesity with BMI of 50.0-59.9, adult [facility]) He has lost about 10 Lb since 11/2021. We discussed benefits of wt loss. Consistency with healthy diet and physical activity encouraged.   Essential hypertension, benign BP adequately controlled. Continue HCTZ and Benicar same doses. Low salt diet. Continue monitoring BP regularly.  Bilateral pulmonary  embolism (HCC) In 07/2019 and 07/2020. Continue Eliquis 5 mg bid.  Colon cancer screening -     Cologuard  Return in about 6 months (around 10/24/2021).  Hideo Googe G. [place], MD  [facility]. [place] office.

## 2021-04-23 ENCOUNTER — Encounter: Payer: Self-pay | Admitting: Family Medicine

## 2021-04-23 ENCOUNTER — Ambulatory Visit: Payer: BC Managed Care – PPO | Admitting: Family Medicine

## 2021-04-23 ENCOUNTER — Telehealth: Payer: Self-pay | Admitting: Family Medicine

## 2021-04-23 VITALS — BP 128/70 | HR 89 | Resp 16 | Ht 72.0 in | Wt 399.0 lb

## 2021-04-23 DIAGNOSIS — I83029 Varicose veins of left lower extremity with ulcer of unspecified site: Secondary | ICD-10-CM

## 2021-04-23 DIAGNOSIS — L97929 Non-pressure chronic ulcer of unspecified part of left lower leg with unspecified severity: Secondary | ICD-10-CM

## 2021-04-23 DIAGNOSIS — I10 Essential (primary) hypertension: Secondary | ICD-10-CM

## 2021-04-23 DIAGNOSIS — I2699 Other pulmonary embolism without acute cor pulmonale: Secondary | ICD-10-CM

## 2021-04-23 DIAGNOSIS — I872 Venous insufficiency (chronic) (peripheral): Secondary | ICD-10-CM

## 2021-04-23 DIAGNOSIS — M109 Gout, unspecified: Secondary | ICD-10-CM

## 2021-04-23 DIAGNOSIS — L97911 Non-pressure chronic ulcer of unspecified part of right lower leg limited to breakdown of skin: Secondary | ICD-10-CM | POA: Insufficient documentation

## 2021-04-23 DIAGNOSIS — E1169 Type 2 diabetes mellitus with other specified complication: Secondary | ICD-10-CM | POA: Diagnosis not present

## 2021-04-23 DIAGNOSIS — Z1211 Encounter for screening for malignant neoplasm of colon: Secondary | ICD-10-CM

## 2021-04-23 DIAGNOSIS — Z6841 Body Mass Index (BMI) 40.0 and over, adult: Secondary | ICD-10-CM

## 2021-04-23 LAB — BASIC METABOLIC PANEL
BUN: 12 mg/dL (ref 6–23)
CO2: 26 mEq/L (ref 19–32)
Calcium: 9.2 mg/dL (ref 8.4–10.5)
Chloride: 97 mEq/L (ref 96–112)
Creatinine, Ser: 0.81 mg/dL (ref 0.40–1.50)
GFR: 98.92 mL/min (ref >60.00)
Glucose, Bld: 132 mg/dL — ABNORMAL HIGH (ref 70–99)
Potassium: 4.1 mEq/L (ref 3.5–5.1)
Sodium: 132 mEq/L — ABNORMAL LOW (ref 135–145)

## 2021-04-23 LAB — POCT GLYCOSYLATED HEMOGLOBIN (HGB A1C): HbA1c, POC (prediabetic range): 6.1 % (ref 5.7–6.4)

## 2021-04-23 MED ORDER — ALLOPURINOL 300 MG PO TABS: 300.0000 mg | ORAL_TABLET | Freq: Every day | ORAL | 3 refills | Status: DC

## 2021-04-23 MED ORDER — TRULICITY 1.5 MG/0.5ML ~~LOC~~ SOAJ: 1.5000 mg | SUBCUTANEOUS | 2 refills | Status: DC

## 2021-04-23 NOTE — Telephone Encounter (Signed)
Patient called in requesting a refill for allopurinol (ZYLOPRIM) 300 MG tablet [[id number]]  to be sent to his pharmacy. ? ?Please advise. ?

## 2021-04-23 NOTE — Assessment & Plan Note (Addendum)
HgA1C at goal. ?Continue Trulicity 1.5 mg weekly. ?Annual eye exam, periodic dental and foot care recommended. ?F/U in 5-6 months ? ?

## 2021-04-23 NOTE — Assessment & Plan Note (Signed)
BP adequately controlled. ?Continue HCTZ and Benicar same doses. ?Low salt diet. ?Continue monitoring BP regularly. ?

## 2021-04-23 NOTE — Assessment & Plan Note (Signed)
He has lost about 10 Lb since 11/2021. ?We discussed benefits of wt loss. ?Consistency with healthy diet and physical activity encouraged. ? ?

## 2021-04-23 NOTE — Assessment & Plan Note (Signed)
Stable. ?Continue wearing compression stockings and LE elevation a few times throughout the day. ?Appropriate skin care and avoidance of trauma discussed. ?

## 2021-04-23 NOTE — Telephone Encounter (Signed)
Rx sent in

## 2021-04-23 NOTE — Patient Instructions (Addendum)
A few things to remember from today's visit: ? ?Type 2 diabetes mellitus with other specified complication, without long-term current use of insulin ([place]) - Plan: POC HgB A1c ? ?Bilateral pulmonary embolism ([place]), Chronic ? ?Colon cancer screening - Plan: Cologuard ? ?Venous ulcer of left leg ([place]) ? ?If you need refills please call your pharmacy. ?Do not use My Chart to request refills or for acute issues that need immediate attention. ?  ?Keep wound clean with soap and water,monitor for signs of infection. ?No changes today. ?10-15 min of daily walking. ? ?Please be sure medication list is accurate. ?If a new problem present, please set up appointment sooner than planned today. ? ?

## 2021-04-23 NOTE — Assessment & Plan Note (Addendum)
In 07/2019 and 07/2020. ?Continue Eliquis 5 mg bid. ?

## 2021-10-18 LAB — COLOGUARD

## 2021-10-22 NOTE — Progress Notes (Unsigned)
HPI: Mr.Brevon Dwayne Carne is a 56 y.o. male, who is here today for chronic disease management.  Last seen on 04/23/21 No new problems since his last visit. He sent a cologuard sample and it seems like it could not be process. A new cologuard kit is being mailed.  Hx of PE, he is on Eliquis 5 mg twice daily. Tolerating medication well. He has not noted melena or blood in the stool.  Hyperlipidemia: Has not taken Atorvastatin 20 mg  for the past 5 months. Side effects from medication:None Lab Results  Component Value Date   CHOL 143 11/23/2020   HDL 40.40 11/23/2020   LDLCALC 85 11/23/2020   TRIG 88.0 11/23/2020   CHOLHDL 4 11/23/2020   Hypertension:  Medications: HCTZ 25 mg every other daily (daily was causing constipation) and Benicar 20 mg daily. BP readings at home:120-130/80. Negative for unusual or severe headache, visual changes, exertional chest pain, dyspnea,  focal weakness, or worsening edema.  Lab Results  Component Value Date   CREATININE 0.81 04/23/2021   BUN 12 04/23/2021   NA 132 (L) 04/23/2021   K 4.1 04/23/2021   CL 97 04/23/2021   CO2 26 04/23/2021   Diabetes Mellitus II: Dx'ed in 10/2017. - Checking BG at home: Not checking. - Medications: Trulicity 1.5 mg weekly. - Diet: No major changes, trying to avoid fast foods. - Exercise: Not consistently. - eye exam: 2-3 months ago. - foot exam: 11/2020. - Negative for symptoms of hypoglycemia, polyuria, polydipsia, numbness extremities, foot ulcers/trauma  Lab Results  Component Value Date   HGBA1C 6.1 04/23/2021   Lab Results  Component Value Date   MICROALBUR <0.7 11/23/2020   Review of Systems  Constitutional:  Negative for activity change, appetite change and fever.  HENT:  Negative for nosebleeds and sore throat.   Respiratory:  Negative for cough and wheezing.   Gastrointestinal:  Negative for abdominal pain, nausea and vomiting.       Negative for changes in bowel habits.   Genitourinary:  Negative for decreased urine volume, dysuria and hematuria.  Neurological:  Negative for syncope and facial asymmetry.  Rest of ROS see pertinent positives and negatives in HPI.  Current Outpatient Medications on File Prior to Visit  Medication Sig Dispense Refill   allopurinol (ZYLOPRIM) 300 MG tablet Take 1 tablet (300 mg total) by mouth daily. 90 tablet 3   apixaban (ELIQUIS) 5 MG TABS tablet Take 1 tablet (5 mg total) by mouth 2 (two) times daily. 180 tablet 3   atorvastatin (LIPITOR) 20 MG tablet Take 1 tablet (20 mg total) by mouth daily. 90 tablet 3   Dulaglutide (TRULICITY) 1.5 BL/3.9QZ SOPN Inject 1.5 mg into the skin once a week. 6 mL 2   hydrochlorothiazide (HYDRODIURIL) 25 MG tablet Take 1 tablet (25 mg total) by mouth daily. 90 tablet 2   olmesartan (BENICAR) 20 MG tablet TAKE 1 TABLET BY MOUTH EVERY DAY (MAIN. RETAIL FILLS EXCEEDED) 90 tablet 2   Triamcinolone Acetonide (TRIAMCINOLONE 0.1 % CREAM : EUCERIN) CREA Apply 1 application topically 2 (two) times daily as needed for rash or itching. 500 each 1   No current facility-administered medications on file prior to visit.   Past Medical History:  Diagnosis Date   Arthritis    Lower back pain   Gout    Hypertension    Morbid obesity with BMI of 50.0-59.9, adult ([place])    Pulmonary embolism ([place])    No Known Allergies  Social History  Socioeconomic History   Marital status: Married    Spouse name: Not on file   Number of children: Not on file   Years of education: Not on file   Highest education level: Associate degree: occupational, Hotel manager, or vocational program  Occupational History   Occupation: Teacher, English as a foreign language  Tobacco Use   Smoking status: Never   Smokeless tobacco: Never  Substance and Sexual Activity   Alcohol use: Yes    Comment: occasional   Drug use: No   Sexual activity: Yes  Other Topics Concern   Not on file  Social History Narrative   Not on file   Social Determinants of  Health   Financial Resource Strain: Low Risk  (04/22/2021)   Overall Financial Resource Strain (CARDIA)    Difficulty of Paying Living Expenses: Not very hard  Food Insecurity: No Food Insecurity (04/22/2021)   Hunger Vital Sign    Worried About Running Out of Food in the Last Year: Never true    Ran Out of Food in the Last Year: Never true  Transportation Needs: No Transportation Needs (04/22/2021)   PRAPARE - Hydrologist (Medical): No    Lack of Transportation (Non-Medical): No  Physical Activity: Insufficiently Active (04/22/2021)   Exercise Vital Sign    Days of Exercise per Week: 3 days    Minutes of Exercise per Session: 20 min  Stress: No Stress Concern Present (04/22/2021)   [place]    Feeling of Stress : Only a little  Social Connections: Socially Integrated (04/22/2021)   Social Connection and Isolation Panel [NHANES]    Frequency of Communication with Friends and Family: More than three times a week    Frequency of Social Gatherings with Friends and Family: More than three times a week    Attends Religious Services: 1 to 4 times per year    Active Member of [employer] or Organizations: Yes    Attends Archivist Meetings: 1 to 4 times per year    Marital Status: Married   Vitals:   10/25/21 0730  BP: 124/80  Pulse: 88  Resp: 16  Temp: 98.3 F (36.8 C)  SpO2: 98%   Wt Readings from Last 3 Encounters:  10/25/21 (!) 411 lb (186.4 kg)  04/23/21 (!) 399 lb (181 kg)  11/23/20 (!) 409 lb 6 oz (185.7 kg)  Body mass index is 55.74 kg/m.  Physical Exam Vitals and nursing note reviewed.  Constitutional:      General: He is not in acute distress.    Appearance: He is well-developed.  HENT:     Head: Normocephalic and atraumatic.     Mouth/Throat:     Mouth: Mucous membranes are moist.     Pharynx: Oropharynx is clear.  Eyes:     Conjunctiva/sclera: Conjunctivae normal.   Cardiovascular:     Rate and Rhythm: Normal rate and regular rhythm.     Pulses:          Dorsalis pedis pulses are 2+ on the right side and 2+ on the left side.     Heart sounds: No murmur heard.    Comments: LE lymphedema, bilateral. Pulmonary:     Effort: Pulmonary effort is normal. No respiratory distress.     Breath sounds: Normal breath sounds.  Abdominal:     Palpations: Abdomen is soft. There is no mass.     Tenderness: There is no abdominal tenderness.  Musculoskeletal:  Right lower leg: 2+ Pitting Edema present.     Left lower leg: 2+ Pitting Edema present.  Skin:    General: Skin is warm.     Findings: No erythema or rash.  Neurological:     Mental Status: He is alert and oriented to person, place, and time.     Cranial Nerves: No cranial nerve deficit.     Gait: Gait normal.  Psychiatric:        Mood and Affect: Mood and affect normal.   ASSESSMENT AND PLAN:  Mr. Caulin was seen today for follow-up.  Diagnoses and all orders for this visit: Orders Placed This Encounter  Procedures   Comprehensive metabolic panel   CBC   Microalbumin / creatinine urine ratio   Lipid panel   POC HgB A1c   Lab Results  Component Value Date   HGBA1C 6.6 10/25/2021   Lab Results  Component Value Date   CHOL 175 10/25/2021   HDL 43.70 10/25/2021   LDLCALC 115 (H) 10/25/2021   TRIG 82.0 10/25/2021   CHOLHDL 4 10/25/2021   Lab Results  Component Value Date   MICROALBUR <0.7 10/25/2021   MICROALBUR <0.7 11/23/2020   Lab Results  Component Value Date   ALT 13 10/25/2021   AST 26 10/25/2021   ALKPHOS 55 10/25/2021   BILITOT 1.4 (H) 10/25/2021   Lab Results  Component Value Date   WBC 7.8 10/25/2021   HGB 16.2 10/25/2021   HCT 47.9 10/25/2021   MCV 89.0 10/25/2021   PLT 240.0 10/25/2021   Lab Results  Component Value Date   CREATININE 0.91 10/25/2021   BUN 13 10/25/2021   NA 131 (L) 10/25/2021   K 4.1 10/25/2021   CL 95 (L) 10/25/2021   CO2 29  10/25/2021   Type 2 diabetes mellitus with other specified complication ([place]) HDQ2I at goal, went from 6.1 to 6.6. Continue Trilicity 1.5 mg weekly. Regular exercise and healthy diet with avoidance of added sugar food intake is an important part of treatment and encouraged. Annual eye exam, periodic dental and foot care to continue. F/U in 5-6 months  Hyperlipidemia associated with type 2 diabetes mellitus ([place]) We discussed CV benefits of statins. LDL was 85 in 11/2020. Resume Atorvastatin 20 mg daily. Low fat diet is also recommended.  Essential hypertension, benign BP adequately controlled. Continue current management: Benicar 20 mg daily and HCTZ 25 mg every other day (helps with LE edema as well). DASH/low salt diet to continue. Continue monitoring BP at home. Eye exam is current.  Hx of PE (pulmonary thromboembolism) ([place]) In 07/2019 and 07/2020. Continue Eliquis 5 mg twice daily. Some side effects discussed.  Return in about 6 months (around 04/25/2022) for CPE and f/u.  Bhakti Labella G. [place], MD  [facility]. [place] office.

## 2021-10-25 ENCOUNTER — Encounter: Payer: Self-pay | Admitting: Family Medicine

## 2021-10-25 ENCOUNTER — Ambulatory Visit: Payer: 59 | Admitting: Family Medicine

## 2021-10-25 VITALS — BP 124/80 | HR 88 | Temp 98.3°F | Resp 16 | Ht 72.0 in | Wt >= 6400 oz

## 2021-10-25 DIAGNOSIS — I10 Essential (primary) hypertension: Secondary | ICD-10-CM

## 2021-10-25 DIAGNOSIS — E1169 Type 2 diabetes mellitus with other specified complication: Secondary | ICD-10-CM | POA: Diagnosis not present

## 2021-10-25 DIAGNOSIS — I2699 Other pulmonary embolism without acute cor pulmonale: Secondary | ICD-10-CM | POA: Diagnosis not present

## 2021-10-25 DIAGNOSIS — E785 Hyperlipidemia, unspecified: Secondary | ICD-10-CM

## 2021-10-25 LAB — CBC
HCT: 47.9 % (ref 39.0–52.0)
Hemoglobin: 16.2 g/dL (ref 13.0–17.0)
MCHC: 33.8 g/dL (ref 30.0–36.0)
MCV: 89 fl (ref 78.0–100.0)
Platelets: 240 10*3/uL (ref 150.0–400.0)
RBC: 5.39 Mil/uL (ref 4.22–5.81)
RDW: 14.6 % (ref 11.5–15.5)
WBC: 7.8 10*3/uL (ref 4.0–10.5)

## 2021-10-25 LAB — COMPREHENSIVE METABOLIC PANEL
ALT: 13 U/L (ref 0–53)
AST: 26 U/L (ref 0–37)
Albumin: 4 g/dL (ref 3.5–5.2)
Alkaline Phosphatase: 55 U/L (ref 39–117)
BUN: 13 mg/dL (ref 6–23)
CO2: 29 mEq/L (ref 19–32)
Calcium: 9.6 mg/dL (ref 8.4–10.5)
Chloride: 95 mEq/L — ABNORMAL LOW (ref 96–112)
Creatinine, Ser: 0.91 mg/dL (ref 0.40–1.50)
GFR: 94.23 mL/min (ref >60.00)
Glucose, Bld: 120 mg/dL — ABNORMAL HIGH (ref 70–99)
Potassium: 4.1 mEq/L (ref 3.5–5.1)
Sodium: 131 mEq/L — ABNORMAL LOW (ref 135–145)
Total Bilirubin: 1.4 mg/dL — ABNORMAL HIGH (ref 0.2–1.2)
Total Protein: 8.5 g/dL — ABNORMAL HIGH (ref 6.0–8.3)

## 2021-10-25 LAB — LIPID PANEL
Cholesterol: 175 mg/dL (ref 0–200)
HDL: 43.7 mg/dL (ref >39.00)
LDL Cholesterol: 115 mg/dL — ABNORMAL HIGH (ref 0–99)
NonHDL: 131.6
Total CHOL/HDL Ratio: 4
Triglycerides: 82 mg/dL (ref 0.0–149.0)
VLDL: 16.4 mg/dL (ref 0.0–40.0)

## 2021-10-25 LAB — POCT GLYCOSYLATED HEMOGLOBIN (HGB A1C): HbA1c, POC (controlled diabetic range): 6.6 % (ref 0.0–7.0)

## 2021-10-25 LAB — MICROALBUMIN / CREATININE URINE RATIO
Creatinine,U: 125 mg/dL
Microalb Creat Ratio: 0.6 mg/g (ref 0.0–30.0)
Microalb, Ur: <0.7 mg/dL (ref 0.0–1.9)

## 2021-10-25 MED ORDER — APIXABAN 5 MG PO TABS: 5.0000 mg | ORAL_TABLET | Freq: Two times a day (BID) | ORAL | 3 refills | Status: DC

## 2021-10-25 MED ORDER — OLMESARTAN MEDOXOMIL 20 MG PO TABS: ORAL_TABLET | ORAL | 2 refills | Status: DC

## 2021-10-25 MED ORDER — TRULICITY 1.5 MG/0.5ML ~~LOC~~ SOAJ: 1.5000 mg | SUBCUTANEOUS | 2 refills | Status: DC

## 2021-10-25 MED ORDER — ATORVASTATIN CALCIUM 20 MG PO TABS: 20.0000 mg | ORAL_TABLET | Freq: Every day | ORAL | 3 refills | Status: AC

## 2021-10-25 NOTE — Assessment & Plan Note (Signed)
We discussed CV benefits of statins. LDL was 85 in 11/2020. Resume Atorvastatin 20 mg daily. Low fat diet is also recommended.

## 2021-10-25 NOTE — Assessment & Plan Note (Signed)
BP adequately controlled. Continue current management: Benicar 20 mg daily and HCTZ 25 mg every other day (helps with LE edema as well). DASH/low salt diet to continue. Continue monitoring BP at home. Eye exam is current.

## 2021-10-25 NOTE — Assessment & Plan Note (Signed)
HgA1C at goal, went from 6.1 to 6.6. Continue Trilicity 1.5 mg weekly. Regular exercise and healthy diet with avoidance of added sugar food intake is an important part of treatment and encouraged. Annual eye exam, periodic dental and foot care to continue. F/U in 5-6 months

## 2021-10-25 NOTE — Assessment & Plan Note (Signed)
In 07/2019 and 07/2020. Continue Eliquis 5 mg twice daily. Some side effects discussed.

## 2021-10-25 NOTE — Patient Instructions (Addendum)
A few things to remember from today's visit:   Type 2 diabetes mellitus with other specified complication, without long-term current use of insulin (H[facility]) - Plan: POC HgB A1c, Comprehensive metabolic panel, Microalbumin / creatinine urine ratio  Essential hypertension, benign - Plan: Comprehensive metabolic panel, CBC  Hyperlipidemia associated with type 2 diabetes mellitus ([facility]) - Plan: Comprehensive metabolic panel, Lipid panel  Bilateral pulmonary embolism ([facility]) - Plan: CBC  If you need refills please call your pharmacy. Do not use My Chart to request refills or for acute issues that need immediate attention.   Try to take cholesterol medication daily.  Please be sure medication list is accurate. If a new problem present, please set up appointment sooner than planned today.  Diabetes Mellitus and Nutrition, Adult When you have diabetes, or diabetes mellitus, it is very important to have healthy eating habits because your blood sugar (glucose) levels are greatly affected by what you eat and drink. Eating healthy foods in the right amounts, at about the same times every day, can help you: Manage your blood glucose. Lower your risk of heart disease. Improve your blood pressure. Reach or maintain a healthy weight. What can affect my meal plan? Every person with diabetes is different, and each person has different needs for a meal plan. Your health care provider may recommend that you work with a dietitian to make a meal plan that is best for you. Your meal plan may vary depending on factors such as: The calories you need. The medicines you take. Your weight. Your blood glucose, blood pressure, and cholesterol levels. Your activity level. Other health conditions you have, such as heart or kidney disease. How do carbohydrates affect me? Carbohydrates, also called carbs, affect your blood glucose level more than any other type of food. Eating carbs raises the amount of glucose in your  blood. It is important to know how many carbs you can safely have in each meal. This is different for every person. Your dietitian can help you calculate how many carbs you should have at each meal and for each snack. How does alcohol affect me? Alcohol can cause a decrease in blood glucose (hypoglycemia), especially if you use insulin or take certain diabetes medicines by mouth. Hypoglycemia can be a life-threatening condition. Symptoms of hypoglycemia, such as sleepiness, dizziness, and confusion, are similar to symptoms of having too much alcohol. Do not drink alcohol if: Your health care provider tells you not to drink. You are pregnant, may be pregnant, or are planning to become pregnant. If you drink alcohol: Limit how much you have to: 0-1 drink a day for women. 0-2 drinks a day for men. Know how much alcohol is in your drink. In the [place] one drink equals one 12 oz bottle of beer (355 mL), one 5 oz glass of wine (148 mL), or one 1 oz glass of hard liquor (44 mL). Keep yourself hydrated with water, diet soda, or unsweetened iced tea. Keep in mind that regular soda, juice, and other mixers may contain a lot of sugar and must be counted as carbs. What are tips for following this plan?  Reading food labels Start by checking the serving size on the Nutrition Facts label of packaged foods and drinks. The number of calories and the amount of carbs, fats, and other nutrients listed on the label are based on one serving of the item. Many items contain more than one serving per package. Check the total grams (g) of carbs in one serving.  Check the number of grams of saturated fats and trans fats in one serving. Choose foods that have a low amount or none of these fats. Check the number of milligrams (mg) of salt (sodium) in one serving. Most people should limit total sodium intake to less than 2,300 mg per day. Always check the nutrition information of foods labeled as "low-fat" or "nonfat." These  foods may be higher in added sugar or refined carbs and should be avoided. Talk to your dietitian to identify your daily goals for nutrients listed on the label. Shopping Avoid buying canned, pre-made, or processed foods. These foods tend to be high in fat, sodium, and added sugar. Shop around the outside edge of the grocery store. This is where you will most often find fresh fruits and vegetables, bulk grains, fresh meats, and fresh dairy products. Cooking Use low-heat cooking methods, such as baking, instead of high-heat cooking methods, such as deep frying. Cook using healthy oils, such as olive, canola, or sunflower oil. Avoid cooking with butter, cream, or high-fat meats. Meal planning Eat meals and snacks regularly, preferably at the same times every day. Avoid going long periods of time without eating. Eat foods that are high in fiber, such as fresh fruits, vegetables, beans, and whole grains. Eat 4-6 oz (112-168 g) of lean protein each day, such as lean meat, chicken, fish, eggs, or tofu. One ounce (oz) (28 g) of lean protein is equal to: 1 oz (28 g) of meat, chicken, or fish. 1 egg.  cup (62 g) of tofu. Eat some foods each day that contain healthy fats, such as avocado, nuts, seeds, and fish. What foods should I eat? Fruits Berries. Apples. Oranges. Peaches. Apricots. Plums. Grapes. Mangoes. Papayas. Pomegranates. Kiwi. Cherries. Vegetables Leafy greens, including lettuce, spinach, kale, chard, collard greens, mustard greens, and cabbage. Beets. Cauliflower. Broccoli. Carrots. Green beans. Tomatoes. Peppers. Onions. Cucumbers. Brussels sprouts. Grains Whole grains, such as whole-wheat or whole-grain bread, crackers, tortillas, cereal, and pasta. Unsweetened oatmeal. Quinoa. Brown or wild rice. Meats and other proteins Seafood. Poultry without skin. Lean cuts of poultry and beef. Tofu. Nuts. Seeds. Dairy Low-fat or fat-free dairy products such as milk, yogurt, and cheese. The  items listed above may not be a complete list of foods and beverages you can eat and drink. Contact a dietitian for more information. What foods should I avoid? Fruits Fruits canned with syrup. Vegetables Canned vegetables. Frozen vegetables with butter or cream sauce. Grains Refined white flour and flour products such as bread, pasta, snack foods, and cereals. Avoid all processed foods. Meats and other proteins Fatty cuts of meat. Poultry with skin. Breaded or fried meats. Processed meat. Avoid saturated fats. Dairy Full-fat yogurt, cheese, or milk. Beverages Sweetened drinks, such as soda or iced tea. The items listed above may not be a complete list of foods and beverages you should avoid. Contact a dietitian for more information. Questions to ask a health care provider Do I need to meet with a certified diabetes care and education specialist? Do I need to meet with a dietitian? What number can I call if I have questions? When are the best times to check my blood glucose? Where to find more information: American Diabetes Association: diabetes.org Academy of Nutrition and Dietetics: eatright.[employer] of Diabetes and Digestive and Kidney Diseases: StageSync.si Association of Diabetes Care & Education Specialists: diabeteseducator.org Summary It is important to have healthy eating habits because your blood sugar (glucose) levels are greatly affected by what you eat  and drink. It is important to use alcohol carefully. A healthy meal plan will help you manage your blood glucose and lower your risk of heart disease. Your health care provider may recommend that you work with a dietitian to make a meal plan that is best for you. This information is not intended to replace advice given to you by your health care provider. Make sure you discuss any questions you have with your health care provider. Document Revised: 09/04/2019 Document Reviewed: 09/04/2019 Elsevier Patient  Education  2023 [employer].

## 2021-10-30 IMAGING — CR DG CHEST 2V
2 series · 2 of 2 positions shown · non-contrast
Comparison: 07/19/2019

CLINICAL DATA: Shortness of breath and chest pain for

EXAM:
CHEST - 2 VIEW

[chest pa]
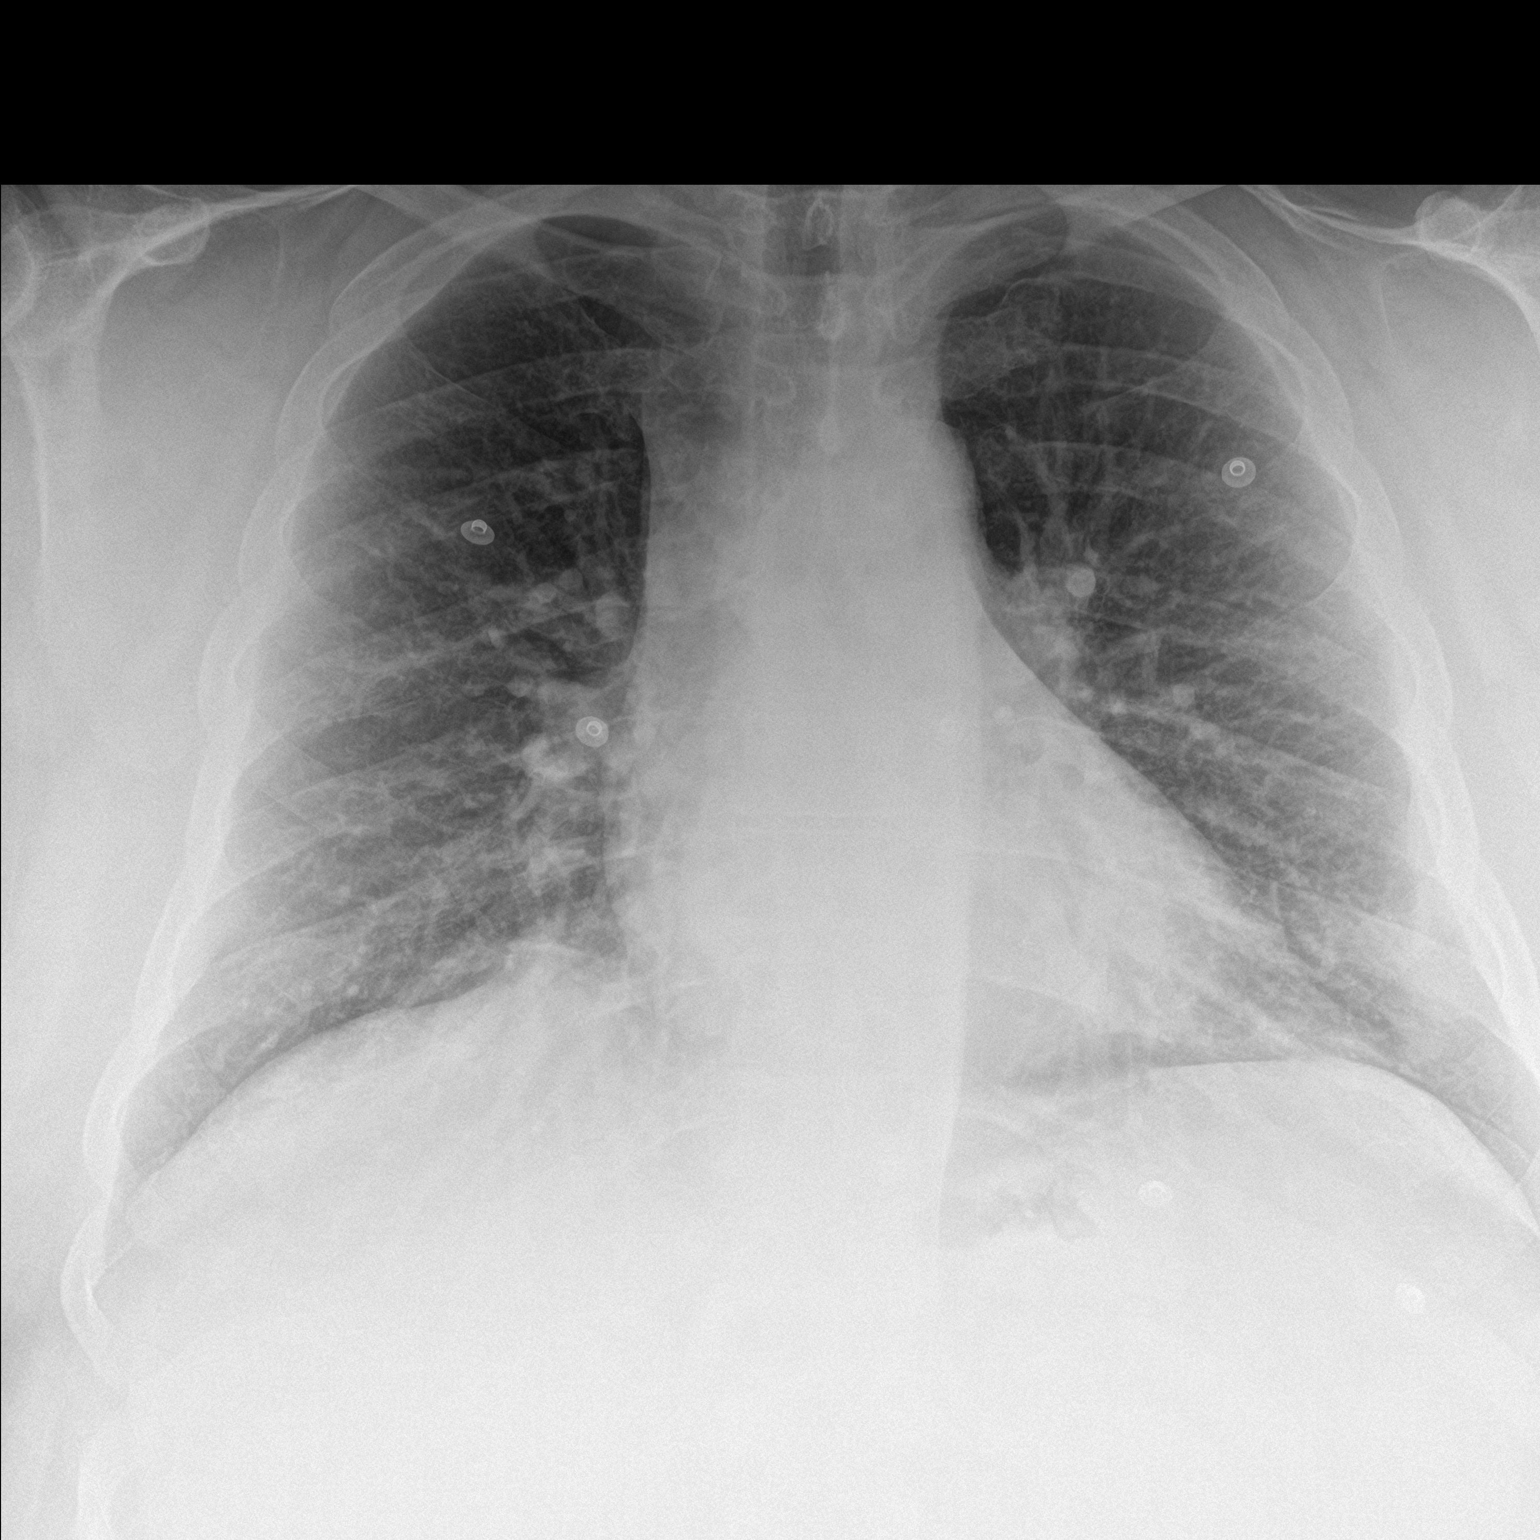

[chest lat]
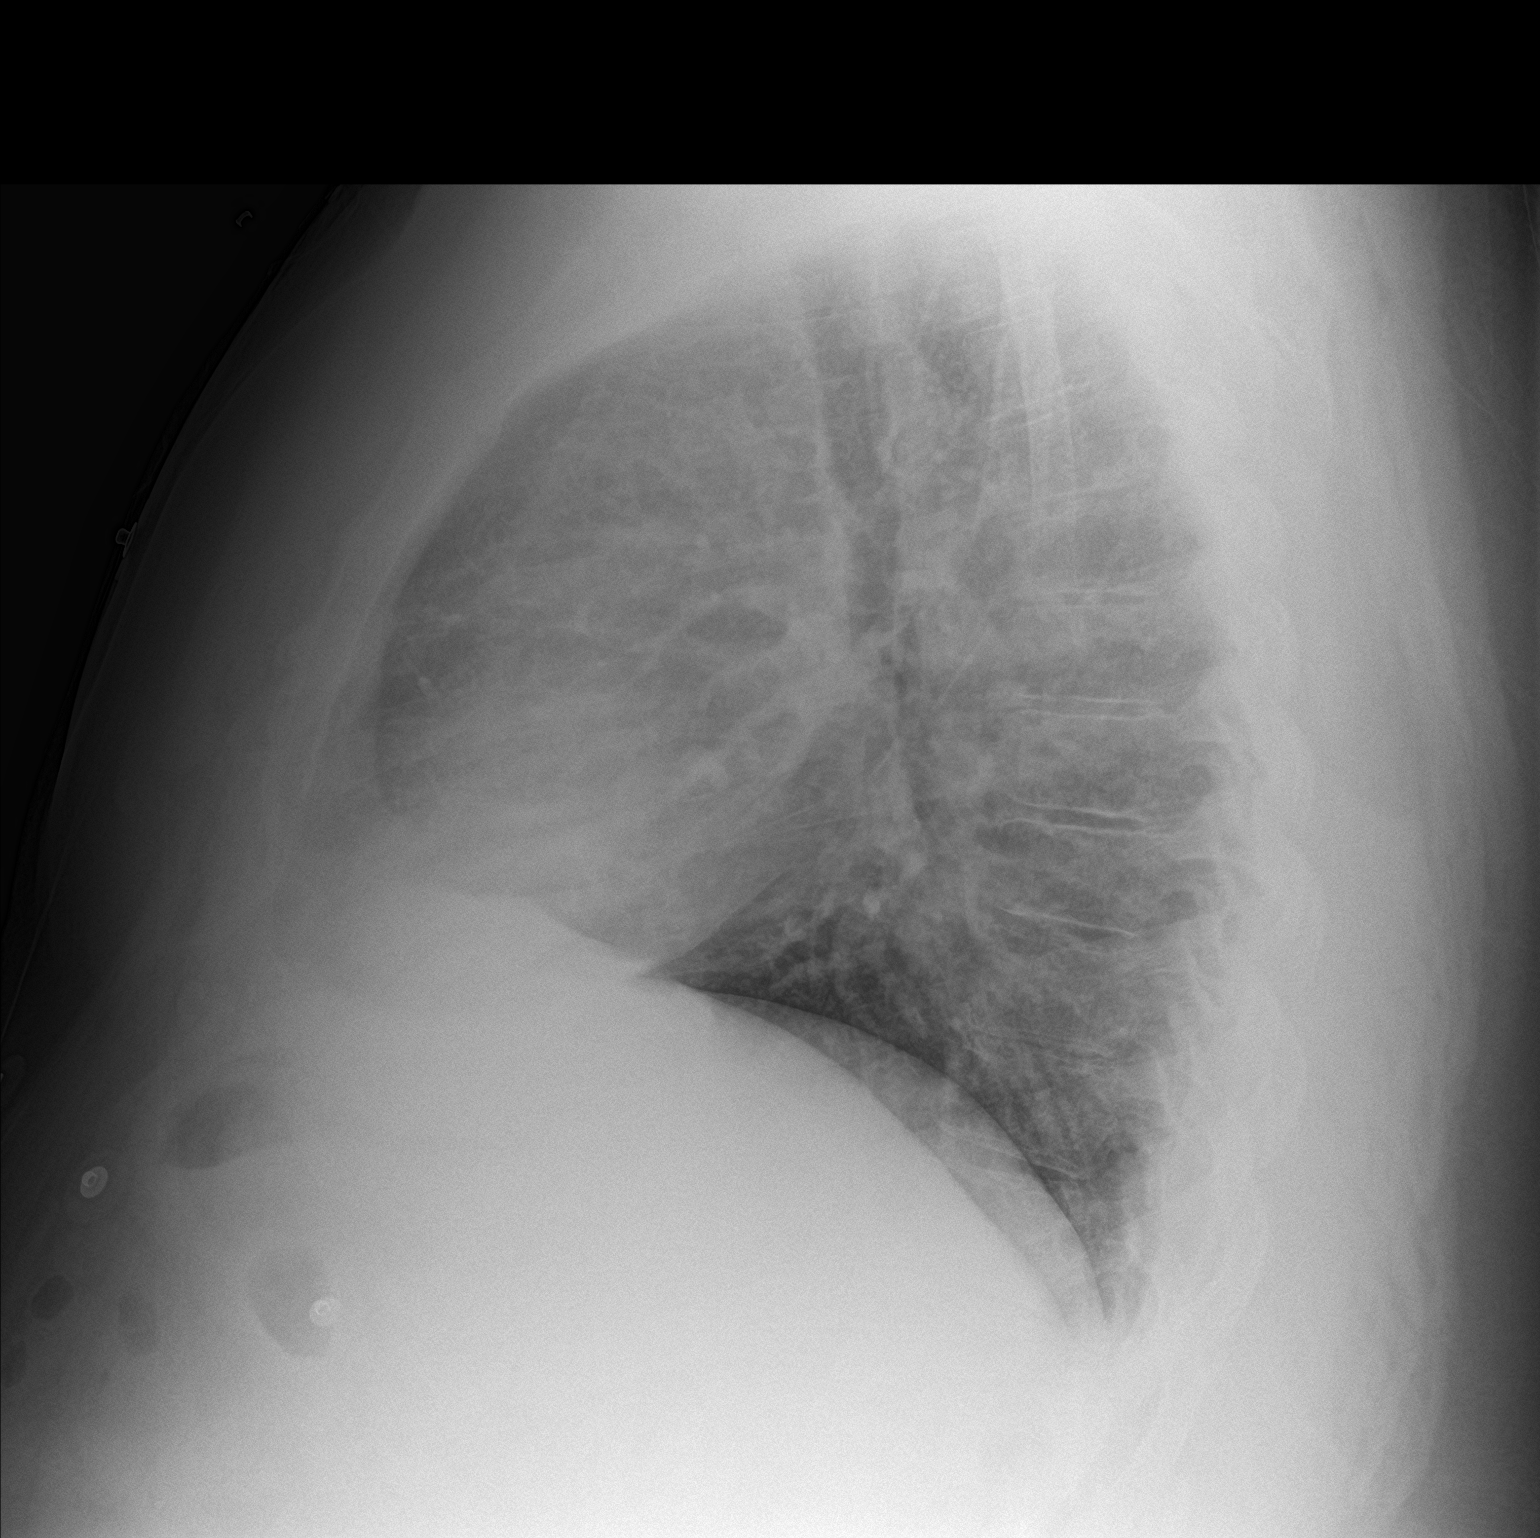

[2 of 2 positions shown; findings below may reference images not displayed]

FINDINGS: Cardiac shadow is stable. The lungs are well aerated bilaterally. No
focal infiltrate or effusion is seen. No bony abnormality is noted.
IMPRESSION: No acute abnormality noted.

## 2021-10-30 IMAGING — CT CT ANGIO CHEST
4 of 7 series · 16 of 36 positions shown · IV contrast (omnipaque)
Comparison: Chest radiograph July 27, 2020; chest CT July 19, 2020

CLINICAL DATA: Shortness of breath and elevated D-dimer

EXAM:
CT ANGIOGRAPHY CHEST WITH CONTRAST
TECHNIQUE: Multidetector CT imaging of the chest was performed using the
standard protocol during bolus administration of intravenous
contrast. Multiplanar CT image reconstructions and MIPs were
obtained to evaluate the vascular anatomy.
CONTRAST:  50mL OMNIPAQUE IOHEXOL 350 MG/ML SOLN

[Series 5: pe 2mm · axial · 0.79mm/px · z∈[+1181,+1363]mm · 4 of 153 slices shown]
[im 31/153  lung]
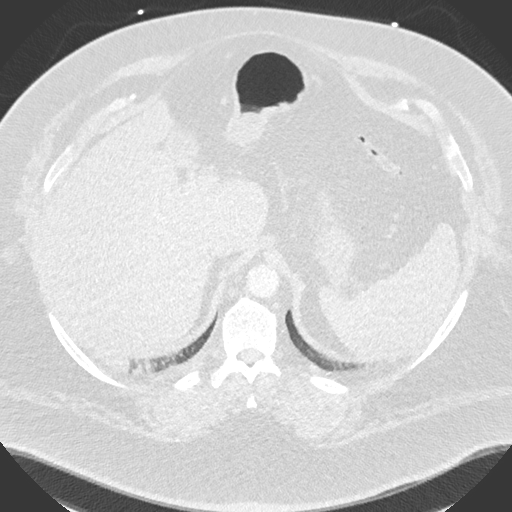
[im 61/153  mediastinal]
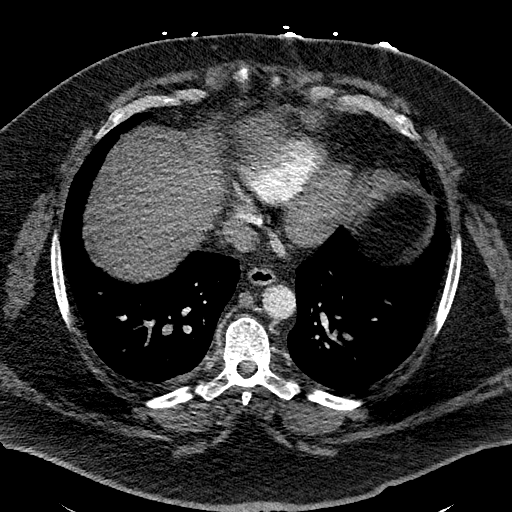
[im 92/153  lung]
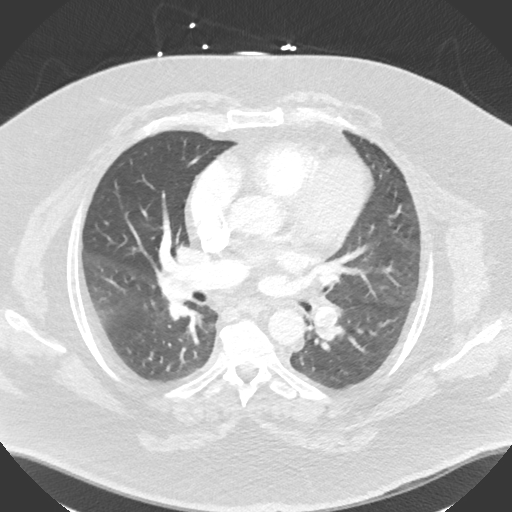
[im 122/153  mediastinal]
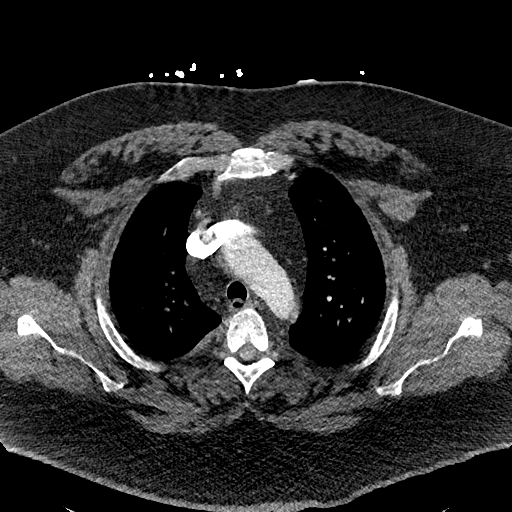

[Series 6: pe lung · axial · 0.79mm/px · z∈[+1227,+1359]mm · 3 of 132 slices shown]
[im 33/132  mediastinal]
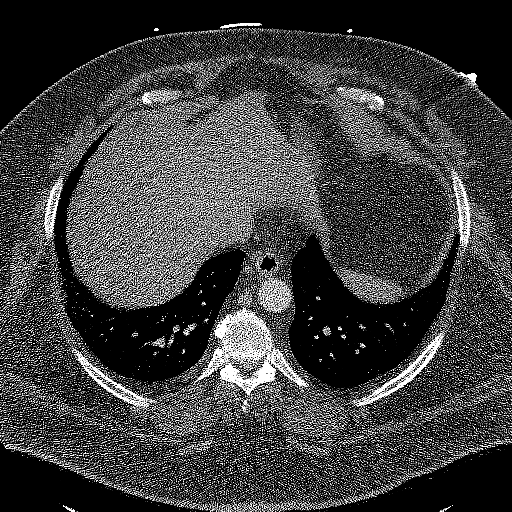
[im 66/132  mediastinal]
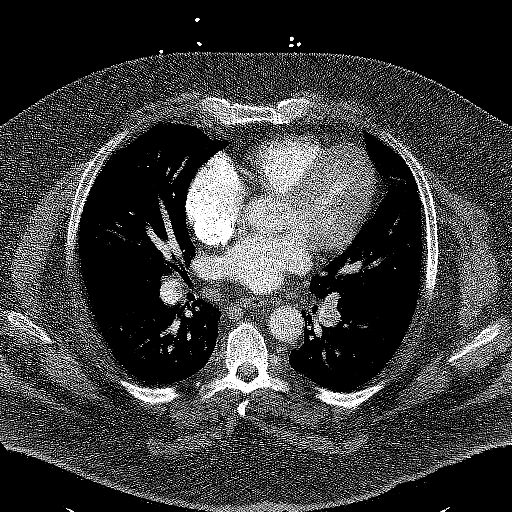
[im 99/132  mediastinal]
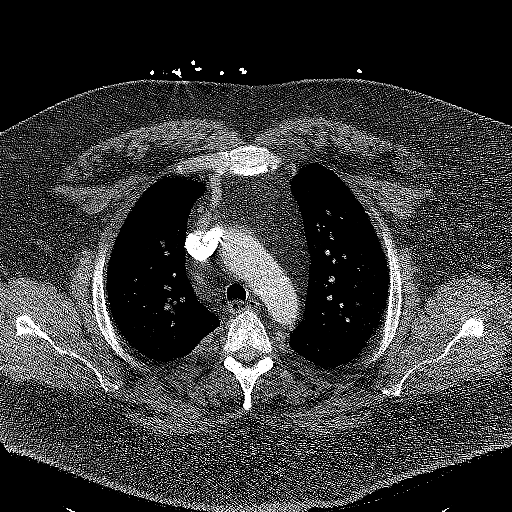

[Series 7: pe thins · axial · 0.79mm/px · z∈[+1145,+1406]mm · 8 of 426 slices shown]
[im 27/426  lung]
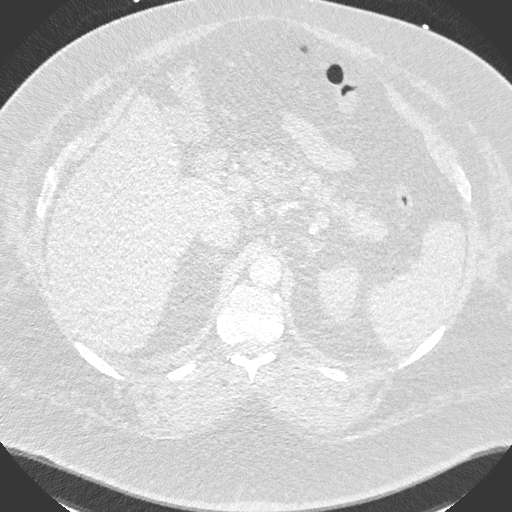
[im 80/426  lung]
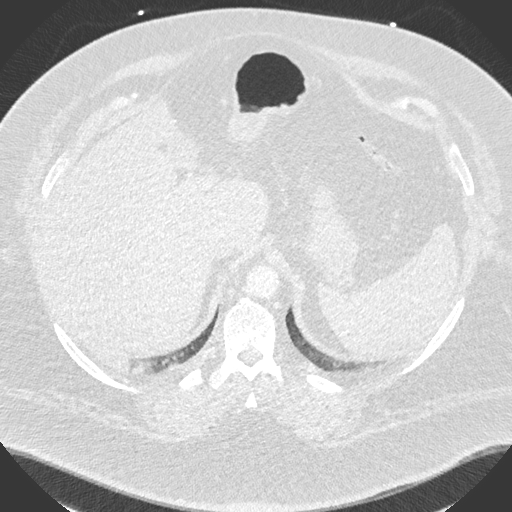
[im 133/426  lung]
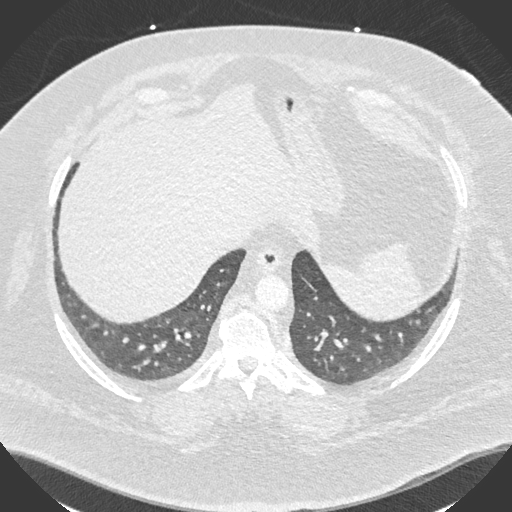
[im 186/426  lung]
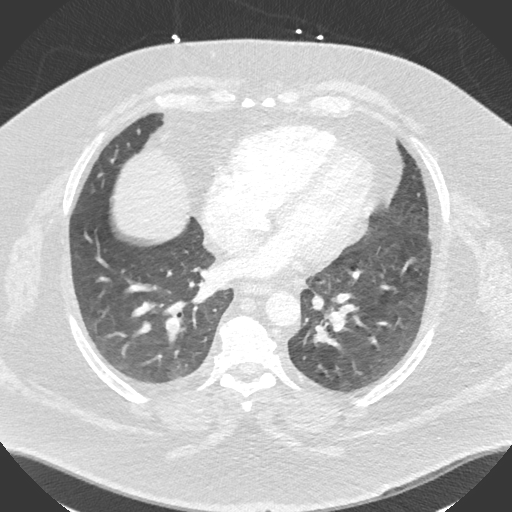
[im 240/426  lung]
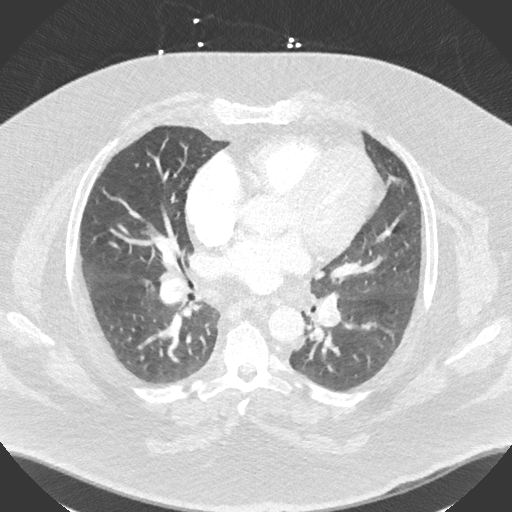
[im 293/426  lung]
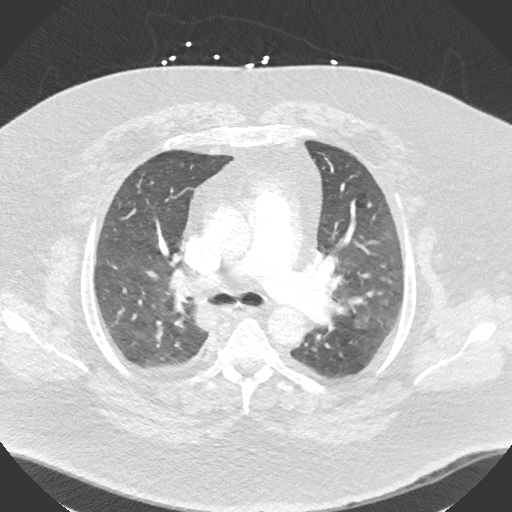
[im 346/426  lung]
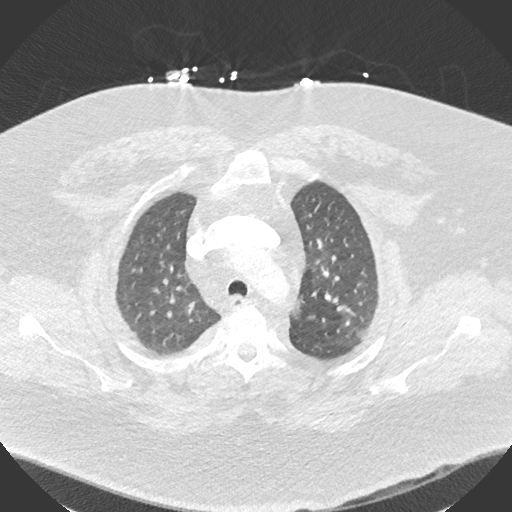
[im 399/426  lung]
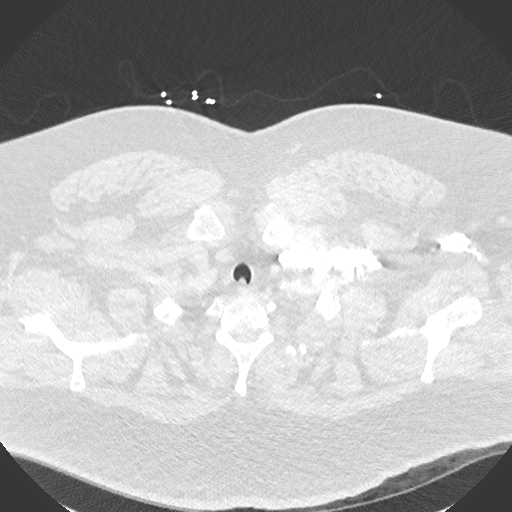

[Series 8: pe 2mm cor · coronal · 0.60mm/px · 1 of 153 slices shown]
[im 77/153  mediastinal]
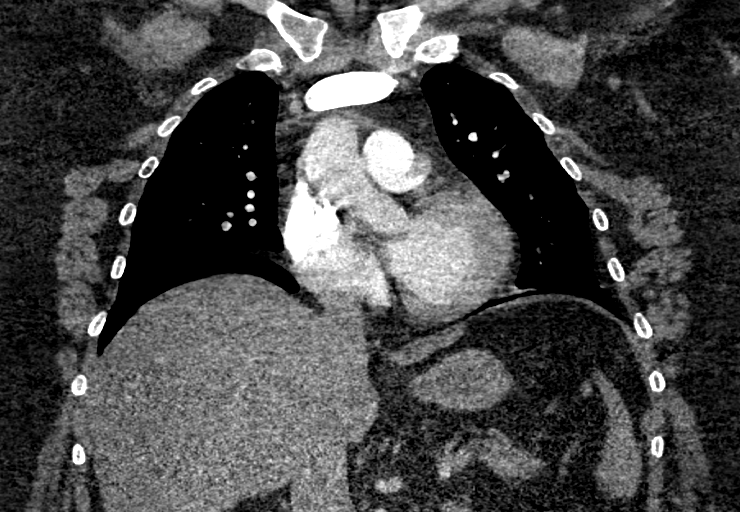

[16 of 36 positions shown; findings below may reference images not displayed]

FINDINGS: Cardiovascular: There are multiple pulmonary emboli arising from the
main right pulmonary artery extending into multiple right lower lobe
pulmonary arteries as well as pulmonary emboli in multiple left
lower lobe pulmonary arteries. Previous pulmonary embolus in the
posterior segment of the left upper lobe pulmonary artery is not
present. The right ventricle to left ventricle diameter ratio is
1.2, evident of right heart strain.

There is no thoracic aortic aneurysm or dissection. Visualized great
vessels appear unremarkable. There is aortic atherosclerosis. There
are occasional foci of coronary artery calcification.

Mediastinum/Nodes: Visualized thyroid appears unremarkable. There is
moderate mediastinal fat. No evident thoracic adenopathy. No
esophageal lesions.

Lungs/Pleura: There is a suspected pulmonary infarct in the
posterior right base. There are areas of mild atelectatic change
bilaterally. There is mosaic attenuation in each upper lobe
posteriorly. No appreciable pleural effusions. Trachea and major
bronchial structures appear patent. No pneumothorax.

Upper Abdomen: Visualized upper abdominal structures appear normal.

Musculoskeletal: No blastic or lytic bone lesions. No chest wall
lesions.

Review of the MIP images confirms the above findings.
IMPRESSION: 1. Multiple pulmonary emboli in a distribution similar to prior
study. It is difficult to ascertain how much of the burden of
pulmonary embolus currently is acute versus chronic. Acute pulmonary
embolus must be of concern given the appearance of the current
pulmonary emboli. Specifically, these pulmonary emboli show diffuse
decreased attenuation suggesting acute pulmonary embolus. There is
no pulmonary arterial vessels sclerosis or narrowing. Note that
there is right heart strain. Positive for acute PE with CT evidence
of right heart strain (RV/LV Ratio = 1.2) consistent with at least
submassive (intermediate risk) PE. The presence of right heart
strain has been associated with an increased risk of morbidity and
mortality. Please refer to the "PE Focused" order set in [REDACTED].

2. No thoracic aortic aneurysm or dissection. There is aortic
atherosclerosis and occasional foci coronary artery calcification.

3. Suspect pulmonary infarct posterior left base. There may be a
small associated focus of atelectasis/pneumonia in this area.

4. Areas of mosaic attenuation in the upper lobes which likely
represents underlying small airways obstructive disease.

5.  No evident thoracic adenopathy.

Aortic Atherosclerosis (RSEX5-BIL.L).

Critical Value/emergent results were called by telephone at the time
of interpretation on 07/27/2020 at [DATE] to provider NHANAH AMA DIANA
, who verbally acknowledged these results.

## 2021-11-05 ENCOUNTER — Telehealth: Payer: Self-pay | Admitting: Family Medicine

## 2021-11-05 NOTE — Telephone Encounter (Signed)
He needs to hold on Eliquis 24 hours before procedure, so do not take med the day before procedure (skip 2 doses). Resume the day after procedure. Thanks, BJ

## 2021-11-05 NOTE — Telephone Encounter (Signed)
Patient is aware of message below. He requested that this be sent to his dentist to reassure them. Letter created & will be faxed.

## 2021-11-05 NOTE — Telephone Encounter (Signed)
Pt called to inform MD that he has a dental appt on Monday to have a tooth extracted.  Pt is currently on blood thinners.  Pt would like any advise from MD on what to do before and after procedure.  Please advise.

## 2021-11-08 NOTE — Telephone Encounter (Signed)
Faxed letter to dental office & received confirmation.

## 2021-11-26 ENCOUNTER — Telehealth: Payer: Self-pay | Admitting: Family Medicine

## 2021-11-26 NOTE — Telephone Encounter (Signed)
Is Ozempic or Mounjaro better covered by his health insurance? Thanks, BJ

## 2021-11-26 NOTE — Telephone Encounter (Signed)
Pt is calling and the Dulaglutide (TRULICITY) 1.5 OX/7.3ZH SOPN cost to much per pt and he would like new rx ozempic to be send to  [facility], [place] Phone:  [phone]  Fax:  [phone]

## 2021-11-26 NOTE — Telephone Encounter (Signed)
Ozempic

## 2021-11-30 ENCOUNTER — Other Ambulatory Visit: Payer: Self-pay | Admitting: Family Medicine

## 2021-11-30 DIAGNOSIS — E1169 Type 2 diabetes mellitus with other specified complication: Secondary | ICD-10-CM

## 2021-11-30 MED ORDER — SEMAGLUTIDE (1 MG/DOSE) 4 MG/3ML ~~LOC~~ SOPN: 1.0000 mg | PEN_INJECTOR | SUBCUTANEOUS | 3 refills | Status: DC

## 2021-11-30 NOTE — Telephone Encounter (Signed)
Ozempic sent to start 1 mg weekly given the fact he was already on Trulicity.  If nausea develops, he can decrease dose of ozempic to 0.5 mg weekly. Thanks, BJ

## 2021-11-30 NOTE — Telephone Encounter (Signed)
I left patient a detailed voicemail with the information below as per his DPR.

## 2021-12-20 ENCOUNTER — Other Ambulatory Visit: Payer: Self-pay

## 2021-12-20 LAB — COLOGUARD: COLOGUARD: POSITIVE — AB

## 2021-12-20 MED ORDER — APIXABAN 5 MG PO TABS: 5.0000 mg | ORAL_TABLET | Freq: Two times a day (BID) | ORAL | 3 refills | Status: DC

## 2021-12-21 ENCOUNTER — Other Ambulatory Visit: Payer: Self-pay

## 2021-12-21 DIAGNOSIS — R195 Other fecal abnormalities: Secondary | ICD-10-CM

## 2022-01-12 ENCOUNTER — Telehealth: Payer: Self-pay | Admitting: Family Medicine

## 2022-01-12 DIAGNOSIS — E1169 Type 2 diabetes mellitus with other specified complication: Secondary | ICD-10-CM

## 2022-01-12 MED ORDER — SEMAGLUTIDE (1 MG/DOSE) 4 MG/3ML ~~LOC~~ SOPN: 1.0000 mg | PEN_INJECTOR | SUBCUTANEOUS | 3 refills | Status: DC

## 2022-01-12 NOTE — Telephone Encounter (Signed)
I sent in a new prescription for the Ozempic and also started a prior authorization for it. Awaiting insurance determination now.

## 2022-01-12 NOTE — Telephone Encounter (Signed)
Pt called to say he is having a hard time getting the Trulicity or Ozempic.  Pt prefers the Ozempic.  Pt states MD may need to send an approval or authorization for one of them.  Pt has [facility].  [id number] Rosalita Levan, [place] - [address] AT [facility] [address] Phone: [phone]  Fax: [phone]

## 2022-02-14 ENCOUNTER — Other Ambulatory Visit: Payer: Self-pay | Admitting: Family Medicine

## 2022-02-14 DIAGNOSIS — M109 Gout, unspecified: Secondary | ICD-10-CM

## 2022-03-22 ENCOUNTER — Encounter: Payer: Self-pay | Admitting: Gastroenterology

## 2022-04-01 ENCOUNTER — Telehealth: Payer: Self-pay | Admitting: *Deleted

## 2022-04-01 NOTE — Telephone Encounter (Signed)
Yesi,  This pt's BMI is greater than 50; their procedure will need to be performed at the hospital.  Thanks,  Osvaldo Angst

## 2022-04-05 ENCOUNTER — Encounter: Payer: Self-pay | Admitting: Nurse Practitioner

## 2022-04-06 NOTE — Telephone Encounter (Addendum)
Pt is scheduled w/Colleen for 05-11-22. Procedure at [facility] cancelled.

## 2022-04-29 NOTE — Progress Notes (Unsigned)
HPI: Chief Complaint  Patient presents with   Medical Management of Chronic Issues   Earl Donovan is a 57 y.o. male, who is here today for chronic disease management. Last seen on 10/25/21  -Hypertension: He is currently on HCTZ 25 mg daily and olmesartan 20 mg daily. BP readings at home:130's/80's. Negative for unusual or severe headache, visual changes, exertional chest pain, dyspnea,  focal weakness, or worsening LE edema.  Lab Results  Component Value Date   CREATININE 0.91 10/25/2021   BUN 13 10/25/2021   NA 131 (L) 10/25/2021   K 4.1 10/25/2021   CL 95 (L) 10/25/2021   CO2 29 10/25/2021   -Recurrent PE, he is on Eliquis 5 mg bid.  -Diabetes Mellitus II: Dx'ed in 10/2017. Checking BG at home: Not checking. He is on Ozempic 1 mg weekly. Eye exam: A year ago. Negative for symptoms of hypoglycemia, polyuria, polydipsia, numbness extremities, foot ulcers/trauma Lab Results  Component Value Date   HGBA1C 6.6 10/25/2021   Lab Results  Component Value Date   MICROALBUR <0.7 10/25/2021   -Hyperlipidemia: He is currently taking atorvastatin 20 mg daily. Lab Results  Component Value Date   CHOL 175 10/25/2021   HDL 43.70 10/25/2021   LDLCALC 115 (H) 10/25/2021   TRIG 82.0 10/25/2021   CHOLHDL 4 10/25/2021   Review of Systems  Constitutional:  Negative for activity change, appetite change and fever.  HENT:  Negative for nosebleeds and sore throat.   Respiratory:  Negative for cough and wheezing.   Gastrointestinal:  Negative for abdominal pain, nausea and vomiting.  Endocrine: Negative for cold intolerance and heat intolerance.  Genitourinary:  Negative for decreased urine volume, dysuria and hematuria.  Skin:  Negative for wound.  Neurological:  Negative for syncope and facial asymmetry.  See other pertinent positives and negatives in HPI.  Current Outpatient Medications on File Prior to Visit  Medication Sig Dispense Refill   allopurinol (ZYLOPRIM)  300 MG tablet TAKE 1 TABLET BY MOUTH DAILY 90 tablet 3   apixaban (ELIQUIS) 5 MG TABS tablet Take 1 tablet (5 mg total) by mouth 2 (two) times daily. 180 tablet 3   atorvastatin (LIPITOR) 20 MG tablet Take 1 tablet (20 mg total) by mouth daily. 90 tablet 3   hydrochlorothiazide (HYDRODIURIL) 25 MG tablet Take 1 tablet (25 mg total) by mouth daily. 90 tablet 2   olmesartan (BENICAR) 20 MG tablet TAKE 1 TABLET BY MOUTH EVERY DAY (MAIN. RETAIL FILLS EXCEEDED) 90 tablet 2   Semaglutide, 1 MG/DOSE, 4 MG/3ML SOPN Inject 1 mg as directed once a week. 3 mL 3   Triamcinolone Acetonide (TRIAMCINOLONE 0.1 % CREAM : EUCERIN) CREA Apply 1 application topically 2 (two) times daily as needed for rash or itching. 500 each 1   No current facility-administered medications on file prior to visit.   Past Medical History:  Diagnosis Date   Arthritis    Lower back pain   Gout    Hypertension    Morbid obesity with BMI of 50.0-59.9, adult (HCC)    Pulmonary embolism (HCC)    No Known Allergies  Social History   Socioeconomic History   Marital status: Married    Spouse name: Not on file   Number of children: Not on file   Years of education: Not on file   Highest education level: Associate degree: occupational, Hotel manager, or vocational program  Occupational History   Occupation: Teacher, English as a foreign language  Tobacco Use   Smoking status: Never  Smokeless tobacco: Never  Substance and Sexual Activity   Alcohol use: Yes    Comment: occasional   Drug use: No   Sexual activity: Yes  Other Topics Concern   Not on file  Social History Narrative   Not on file   Social Determinants of Health   Financial Resource Strain: Low Risk  (04/22/2021)   Overall Financial Resource Strain (CARDIA)    Difficulty of Paying Living Expenses: Not very hard  Food Insecurity: No Food Insecurity (04/22/2021)   Hunger Vital Sign    Worried About Running Out of Food in the Last Year: Never true    Ran Out of Food in the Last Year:  Never true  Transportation Needs: No Transportation Needs (04/22/2021)   PRAPARE - Hydrologist (Medical): No    Lack of Transportation (Non-Medical): No  Physical Activity: Insufficiently Active (04/22/2021)   Exercise Vital Sign    Days of Exercise per Week: 3 days    Minutes of Exercise per Session: 20 min  Stress: No Stress Concern Present (04/22/2021)   [place]    Feeling of Stress : Only a little  Social Connections: Socially Integrated (04/22/2021)   Social Connection and Isolation Panel [NHANES]    Frequency of Communication with Friends and Family: More than three times a week    Frequency of Social Gatherings with Friends and Family: More than three times a week    Attends Religious Services: 1 to 4 times per year    Active Member of [employer] or Organizations: Yes    Attends Archivist Meetings: 1 to 4 times per year    Marital Status: Married   Today's Vitals   05/02/22 0732  BP: 134/80  Pulse: 76  Resp: 16  Temp: 97.9 F (36.6 C)  TempSrc: Oral  SpO2: 98%  Weight: (!) 403 lb 8 oz (183 kg)  Height: 6' (1.829 m)   Body mass index is 54.72 kg/m.  Wt Readings from Last 3 Encounters:  10/25/21 (!) 411 lb (186.4 kg)  04/23/21 (!) 399 lb (181 kg)  11/23/20 (!) 409 lb 6 oz (185.7 kg)    Physical Exam Vitals and nursing note reviewed.  Constitutional:      General: He is not in acute distress.    Appearance: He is well-developed.  HENT:     Head: Normocephalic.     Mouth/Throat:     Mouth: Mucous membranes are moist.     Pharynx: Oropharynx is clear.  Eyes:     Conjunctiva/sclera: Conjunctivae normal.  Cardiovascular:     Rate and Rhythm: Normal rate and regular rhythm.     Pulses:          Dorsalis pedis pulses are 1+ on the right side and 2+ on the left side.     Heart sounds: No murmur heard.    Comments: Lymphedema LE's, bilateral. Trace pitting LE and  peri ankle edema,bilateral. Varicose veins LE, bilateral and skin chronic vein disease changes. Pulmonary:     Effort: Pulmonary effort is normal. No respiratory distress.     Breath sounds: Normal breath sounds.  Abdominal:     Palpations: Abdomen is soft. There is no hepatomegaly or mass.     Tenderness: There is no abdominal tenderness.  Lymphadenopathy:     Cervical: No cervical adenopathy.  Skin:    General: Skin is warm.     Findings: No erythema or rash.  Neurological:     Mental Status: He is alert and oriented to person, place, and time.     Cranial Nerves: No cranial nerve deficit.     Gait: Gait normal.  Psychiatric:        Mood and Affect: Mood and affect normal.   ASSESSMENT AND PLAN:  Earl Donovan was seen today for medical management of chronic issues.  Diagnoses and all orders for this visit: Lab Results  Component Value Date   HGBA1C 6.1 05/02/2022   Lab Results  Component Value Date   CREATININE 0.71 05/02/2022   BUN 11 05/02/2022   NA 132 (L) 05/02/2022   K 3.9 05/02/2022   CL 99 05/02/2022   CO2 23 05/02/2022   Lab Results  Component Value Date   WBC 6.9 05/02/2022   HGB 16.0 05/02/2022   HCT 45.9 05/02/2022   MCV 88.2 05/02/2022   PLT 226.0 05/02/2022   Lab Results  Component Value Date   ALT 13 05/02/2022   AST 27 05/02/2022   ALKPHOS 56 05/02/2022   BILITOT 1.1 05/02/2022   Lab Results  Component Value Date   CHOL 120 05/02/2022   HDL 39.20 05/02/2022   LDLCALC 67 05/02/2022   TRIG 69.0 05/02/2022   CHOLHDL 3 05/02/2022  Type 2 diabetes mellitus with other specified complication, without long-term current use of insulin (HCC) Assessment & Plan: HgA1C at goal, went from 6.6 to 6.1. Continue Ozempic 1 mg weekly. Regular exercise and healthy diet with avoidance of added sugar food intake is an important part of treatment and encouraged. Annual eye exam, periodic dental and foot care to continue. F/U in 5-6 months  Orders: -      POCT glycosylated hemoglobin (Hb A1C)  Essential hypertension, benign Assessment & Plan: BP mildly elevated, reported similar readings at home. Benicar dose increased from 20 mg to 40 mg daily. Continue HCTZ 25 mg daily and low-salt diet. Continue monitoring BP regularly. He is due for eye exam.  Orders: -     Olmesartan Medoxomil; Take 1 tablet (40 mg total) by mouth daily.  Dispense: 90 tablet; Refill: 1 -     Basic metabolic panel; Future -     CBC; Future  Hyperlipidemia associated with type 2 diabetes mellitus ([place]) Assessment & Plan: He has been more consistent with taking atorvastatin 20 mg,taking it daily and following low-fat diet. Last LDL 115 in 10/2021. Further recommendation will be given according to lipid panel result.  Orders: -     Lipid panel; Future -     Hepatic function panel; Future  PE (pulmonary thromboembolism) [facility]) Assessment & Plan: PE in 07/2019 and 07/2020. Continue Eliquis 5 mg twice daily.  Orders: -     CBC; Future  Decreased pulses in feet Right DP difficult to find. Continue appropriate foot care.  -     VAS [place] ABI WITH/WO TBI; Future  Return in about 5 weeks (around 06/06/2022) for chronic problems.  Willma Obando Martinique, MD [facility]. [place] office.

## 2022-05-02 ENCOUNTER — Ambulatory Visit: Payer: 59 | Admitting: Family Medicine

## 2022-05-02 ENCOUNTER — Encounter: Payer: Self-pay | Admitting: Family Medicine

## 2022-05-02 VITALS — BP 134/80 | HR 76 | Temp 97.9°F | Resp 16 | Ht 72.0 in | Wt >= 6400 oz

## 2022-05-02 DIAGNOSIS — E1169 Type 2 diabetes mellitus with other specified complication: Secondary | ICD-10-CM

## 2022-05-02 DIAGNOSIS — I872 Venous insufficiency (chronic) (peripheral): Secondary | ICD-10-CM

## 2022-05-02 DIAGNOSIS — I2699 Other pulmonary embolism without acute cor pulmonale: Secondary | ICD-10-CM

## 2022-05-02 DIAGNOSIS — I10 Essential (primary) hypertension: Secondary | ICD-10-CM | POA: Diagnosis not present

## 2022-05-02 DIAGNOSIS — E785 Hyperlipidemia, unspecified: Secondary | ICD-10-CM

## 2022-05-02 DIAGNOSIS — R0989 Other specified symptoms and signs involving the circulatory and respiratory systems: Secondary | ICD-10-CM | POA: Diagnosis not present

## 2022-05-02 LAB — CBC
HCT: 45.9 % (ref 39.0–52.0)
Hemoglobin: 16 g/dL (ref 13.0–17.0)
MCHC: 34.9 g/dL (ref 30.0–36.0)
MCV: 88.2 fl (ref 78.0–100.0)
Platelets: 226 10*3/uL (ref 150.0–400.0)
RBC: 5.2 Mil/uL (ref 4.22–5.81)
RDW: 14.4 % (ref 11.5–15.5)
WBC: 6.9 10*3/uL (ref 4.0–10.5)

## 2022-05-02 LAB — BASIC METABOLIC PANEL
BUN: 11 mg/dL (ref 6–23)
CO2: 23 mEq/L (ref 19–32)
Calcium: 9.3 mg/dL (ref 8.4–10.5)
Chloride: 99 mEq/L (ref 96–112)
Creatinine, Ser: 0.71 mg/dL (ref 0.40–1.50)
GFR: 102.2 mL/min (ref >60.00)
Glucose, Bld: 123 mg/dL — ABNORMAL HIGH (ref 70–99)
Potassium: 3.9 mEq/L (ref 3.5–5.1)
Sodium: 132 mEq/L — ABNORMAL LOW (ref 135–145)

## 2022-05-02 LAB — HEPATIC FUNCTION PANEL
ALT: 13 U/L (ref 0–53)
AST: 27 U/L (ref 0–37)
Albumin: 3.8 g/dL (ref 3.5–5.2)
Alkaline Phosphatase: 56 U/L (ref 39–117)
Bilirubin, Direct: 0.3 mg/dL (ref 0.0–0.3)
Total Bilirubin: 1.1 mg/dL (ref 0.2–1.2)
Total Protein: 7.7 g/dL (ref 6.0–8.3)

## 2022-05-02 LAB — POCT GLYCOSYLATED HEMOGLOBIN (HGB A1C): HbA1c, POC (prediabetic range): 6.1 % (ref 5.7–6.4)

## 2022-05-02 LAB — LIPID PANEL
Cholesterol: 120 mg/dL (ref 0–200)
HDL: 39.2 mg/dL (ref >39.00)
LDL Cholesterol: 67 mg/dL (ref 0–99)
NonHDL: 80.69
Total CHOL/HDL Ratio: 3
Triglycerides: 69 mg/dL (ref 0.0–149.0)
VLDL: 13.8 mg/dL (ref 0.0–40.0)

## 2022-05-02 MED ORDER — OLMESARTAN MEDOXOMIL 40 MG PO TABS: 40.0000 mg | ORAL_TABLET | Freq: Every day | ORAL | 1 refills | Status: DC

## 2022-05-02 MED ORDER — HYDROCHLOROTHIAZIDE 25 MG PO TABS: 25.0000 mg | ORAL_TABLET | Freq: Every day | ORAL | 2 refills | Status: DC

## 2022-05-02 NOTE — Assessment & Plan Note (Addendum)
He has been more consistent with taking atorvastatin 20 mg,taking it daily and following low-fat diet. Last LDL 115 in 10/2021. Further recommendation will be given according to lipid panel result.

## 2022-05-02 NOTE — Patient Instructions (Addendum)
A few things to remember from today's visit:  Type 2 diabetes mellitus with other specified complication, without long-term current use of insulin ([place]) - Plan: POC HgB A1c  Essential hypertension, benign - Plan: olmesartan (BENICAR) 40 MG tablet, Basic metabolic panel, CBC  Hyperlipidemia associated with type 2 diabetes mellitus ([place]) - Plan: Lipid panel, Hepatic function panel  PE (pulmonary thromboembolism) ([place]), Chronic - Plan: CBC  Decreased pulses in feet - Plan: VAS [place] ABI WITH/WO TBI  Benicar increased from 20 mg to 40 mg daily. Rest no changes.  If you need refills for medications you take chronically, please call your pharmacy. Do not use My Chart to request refills or for acute issues that need immediate attention. If you send a my chart message, it may take a few days to be addressed, specially if I am not in the office.  Please be sure medication list is accurate. If a new problem present, please set up appointment sooner than planned today.

## 2022-05-02 NOTE — Assessment & Plan Note (Addendum)
PE in 07/2019 and 07/2020. Continue Eliquis 5 mg twice daily.

## 2022-05-02 NOTE — Assessment & Plan Note (Signed)
HgA1C at goal, went from 6.6 to 6.1. Continue Ozempic 1 mg weekly. Regular exercise and healthy diet with avoidance of added sugar food intake is an important part of treatment and encouraged. Annual eye exam, periodic dental and foot care to continue. F/U in 5-6 months

## 2022-05-02 NOTE — Assessment & Plan Note (Signed)
BP mildly elevated, reported similar readings at home. Benicar dose increased from 20 mg to 40 mg daily. Continue HCTZ 25 mg daily and low-salt diet. Continue monitoring BP regularly. He is due for eye exam.

## 2022-05-05 ENCOUNTER — Encounter: Payer: 59 | Admitting: Gastroenterology

## 2022-05-11 ENCOUNTER — Telehealth: Payer: Self-pay

## 2022-05-11 ENCOUNTER — Encounter: Payer: Self-pay | Admitting: Nurse Practitioner

## 2022-05-11 ENCOUNTER — Ambulatory Visit (INDEPENDENT_AMBULATORY_CARE_PROVIDER_SITE_OTHER): Payer: 59 | Admitting: Nurse Practitioner

## 2022-05-11 VITALS — BP 120/76 | HR 76 | Ht 71.0 in | Wt >= 6400 oz

## 2022-05-11 DIAGNOSIS — R195 Other fecal abnormalities: Secondary | ICD-10-CM

## 2022-05-11 MED ORDER — NA SULFATE-K SULFATE-MG SULF 17.5-3.13-1.6 GM/177ML PO SOLN: 1.0000 | Freq: Once | ORAL | 0 refills | Status: AC

## 2022-05-11 NOTE — Telephone Encounter (Signed)
[facility] Pre-operative Risk Assessment     Request for surgical clearance:     Endoscopy Procedure  What type of surgery is being performed?     Colonoscopy  When is this surgery scheduled?     09/05/22  What type of clearance is required ?   Pharmacy  Are there any medications that need to be held prior to surgery and how long? Eliquis & 2 days  Practice name and name of physician performing surgery?      [facility]  What is your office phone and fax number?      Phone- [phone]  [phone]  Anesthesia type (None, local, MAC, general) ?       MAC

## 2022-05-11 NOTE — Patient Instructions (Addendum)
You have been scheduled for a colonoscopy. Please follow written instructions given to you at your visit today.  Please pick up your prep supplies at the pharmacy within the next 1-3 days. If you use inhalers (even only as needed), please bring them with you on the day of your procedure. __________________________________________________________________________     __  __ GLP 1 AGONIST Weekly injectables                   (Bydureon [place], [place], [place], [place], [place], [place])    Hold for 7 days prior to the procedure   Due to recent changes in healthcare laws, you may see the results of your imaging and laboratory studies on MyChart before your provider has had a chance to review them.  We understand that in some cases there may be results that are confusing or concerning to you. Not all laboratory results come back in the same time frame and the provider may be waiting for multiple results in order to interpret others.  Please give [place] 48 hours in order for your provider to thoroughly review all the results before contacting the office for clarification of your results.   Thank you for trusting me with your gastrointestinal care!   Carl Best, CRNP

## 2022-05-11 NOTE — Progress Notes (Signed)
I agree with the assessment and plan as outlined by Ms. Kennedy-Smith. 

## 2022-05-11 NOTE — Progress Notes (Signed)
05/11/2022 Earl Donovan [id number] 1965-10-10   CHIEF COMPLAINT: Schedule colonoscopy  HISTORY OF PRESENT ILLNESS: Earl Donovan is a 57 year old male with a past medical history of morbid obesity, hyperlipidemia, diabetes mellitus type 2, sleep apnea uses CPAP, venous insufficiency with chronic lower extremity stasis dermatitis and bilateral PE 07/22/2019 with recurrent LLE DVT/multiple submassive PE 07/2020 (occurred 6 months after he stopped taking Xarelto). He presents to our office today as referred by Dr. Betty Martinique to schedule a colonoscopy due to having a positive Cologuard test 11/20/2021.  He denies having any upper or lower abdominal pain.  He typically passes a light brown or dark brown stool daily.  No black stools.  He infrequently sees a small amount of bright red blood on the toilet tissue which she attributes to having hemorrhoids.  He denies ever having a screening colonoscopy.  He remains on Eliquis due to recurrent DVT/PE which was thought to be secondary to sedentary lifestyle, however, hematology workup was recommended but was not done.  Echo 07/27/2020: 1. Technically difficult study with limited views 2. Left ventricular ejection fraction, by estimation, is 60 to 65%. The left ventricle has normal function. The left ventricle has no regional wall motion abnormalities. 3. Right ventricular systolic function was not well visualized. The right ventricular size is not well visualized. 4. The mitral valve is grossly normal. No evidence of mitral valve regurgitation. 5. The aortic valve was not well visualized. Aortic valve regurgitation is not visualized.     Latest Ref Rng & Units 05/02/2022    8:11 AM 10/25/2021    7:56 AM 08/04/2020    9:02 AM  CBC  WBC 4.0 - 10.5 K/uL 6.9  7.8  7.9   Hemoglobin 13.0 - 17.0 g/dL 16.0  16.2  14.8   Hematocrit 39.0 - 52.0 % 45.9  47.9  43.1   Platelets 150.0 - 400.0 K/uL 226.0  240.0  202.0        Latest Ref Rng & Units  05/02/2022    8:11 AM 10/25/2021    7:56 AM 04/23/2021    8:03 AM  CMP  Glucose 70 - 99 mg/dL 123  120  132   BUN 6 - 23 mg/dL 11  13  12    Creatinine 0.40 - 1.50 mg/dL 0.71  0.91  0.81   Sodium 135 - 145 mEq/L 132  131  132   Potassium 3.5 - 5.1 mEq/L 3.9  4.1  4.1   Chloride 96 - 112 mEq/L 99  95  97   CO2 19 - 32 mEq/L 23  29  26    Calcium 8.4 - 10.5 mg/dL 9.3  9.6  9.2   Total Protein 6.0 - 8.3 g/dL 7.7  8.5    Total Bilirubin 0.2 - 1.2 mg/dL 1.1  1.4    Alkaline Phos 39 - 117 U/L 56  55    AST 0 - 37 U/L 27  26    ALT 0 - 53 U/L 13  13      Past Medical History:  Diagnosis Date   Arthritis    Lower back pain   DM (diabetes mellitus) ([place])    Gout    Hyperlipidemia    Hypertension    Morbid obesity with BMI of 50.0-59.9, adult ([place])    Pulmonary embolism (HCC)    Sleep apnea treated with continuous positive airway pressure (CPAP)    Stasis dermatitis    Past Surgical History:  Procedure Laterality Date  NO PAST SURGERIES     Social History: He is married.  He is an Chief Financial Officer.  Non-smoker.  No alcohol use.  No drug use.  Family History: family history includes Anxiety disorder in his brother; Arthritis in his mother; Asthma in his maternal grandfather and maternal grandmother; Breast cancer in his mother; Cancer in his paternal grandmother; Dementia in his maternal grandfather; Hyperlipidemia in his brother; Melanoma in his father.  No known family history of esophageal, gastric or colorectal cancer.  No Known Allergies    Outpatient Encounter Medications as of 05/11/2022  Medication Sig   allopurinol (ZYLOPRIM) 300 MG tablet TAKE 1 TABLET BY MOUTH DAILY   apixaban (ELIQUIS) 5 MG TABS tablet Take 1 tablet (5 mg total) by mouth 2 (two) times daily.   atorvastatin (LIPITOR) 20 MG tablet Take 1 tablet (20 mg total) by mouth daily.   hydrochlorothiazide (HYDRODIURIL) 25 MG tablet Take 1 tablet (25 mg total) by mouth daily.   olmesartan (BENICAR) 40 MG tablet Take 1 tablet  (40 mg total) by mouth daily.   Semaglutide, 1 MG/DOSE, 4 MG/3ML SOPN Inject 1 mg as directed once a week.   Triamcinolone Acetonide (TRIAMCINOLONE 0.1 % CREAM : EUCERIN) CREA Apply 1 application topically 2 (two) times daily as needed for rash or itching.   No facility-administered encounter medications on file as of 05/11/2022.   REVIEW OF SYSTEMS:  Gen: Denies fever, sweats or chills. No weight loss.  CV: Denies chest pain or palpitations. Resp: Denies cough, shortness of breath of hemoptysis.  GI: See HPI.  Denies heartburn, dysphagia, stomach or lower abdominal pain. No diarrhea or constipation.  GU : Denies urinary burning, blood in urine, increased urinary frequency or incontinence. MS: + Back pain.  Derm: Recurrent leg ulcers, chronic stasis dermatitis with active weeping to the left leg. Psych: Denies depression, anxiety, memory loss or confusion. Heme: Denies bruising, easy bleeding. Neuro:  Denies headaches, dizziness or paresthesias. Endo:  + Diabetes.   PHYSICAL EXAM: BP 120/76 (BP Location: Left Wrist, Patient Position: Sitting, Cuff Size: Normal)   Pulse 76   Ht 5\' 11"  (1.803 m) Comment: height measured without shoes  Wt (!) 413 lb 2 oz (187.4 kg)   BMI 57.62 kg/m  General: 57 year old male in no acute distress. Head: Normocephalic and atraumatic. Eyes:  Sclerae non-icteric, conjunctive pink. Ears: Normal auditory acuity. Mouth: Dentition intact. No ulcers or lesions.  Neck: Supple, no lymphadenopathy or thyromegaly.  Lungs: Clear bilaterally to auscultation without wheezes, crackles or rhonchi. Heart: Regular rate and rhythm. No murmur, rub or gallop appreciated.  Abdomen: Soft, nontender, nondistended. No masses. No hepatosplenomegaly. Normoactive bowel sounds x 4 quadrants.  Rectal: Deferred. Musculoskeletal: Symmetrical with no gross deformities. Skin: Warm and dry.  Extremities: Gross lower extremity edema RLE > LLE, bilateral stasis dermatitis with erythema  and active weeping from the LLE Neurological: Alert oriented x 4, no focal deficits.  Psychological:  Alert and cooperative. Normal mood and affect.  ASSESSMENT AND PLAN:  57 year old male with a positive Cologuard test 12/10/2021.  Negative Cologuard test 01/23/2018. -Colonoscopy at [facility] benefits and risks discussed including risk with sedation, risk of bleeding, perforation and infection  -Further recommendations to be determined after colonoscopy completed  History of recurrent PE/DVT on Eliquis.  Normal protein S activity. -Our office will contact Dr. Betty Martinique to verify Eliquis hold instructions prior to proceeding with a colonoscopy, may require Lovenox bridge -Recommend hematology consult to rule out hypercoagulable disorder defer to Dr.  Martinique  DM II -Hold Ozempic 7 days prior to procedure date  Chronic bilateral stasis dermatitis with active bleeding from the LLE -Patient instructed to contact Dr. Martinique for further evaluation with concerns of developing cellulitis    CC:  Martinique, Earl G, MD

## 2022-05-18 ENCOUNTER — Ambulatory Visit (HOSPITAL_COMMUNITY)
Admission: RE | Admit: 2022-05-18 | Discharge: 2022-05-18 | Disposition: A | Discharge status: Home / Self Care | Payer: 59 | Source: Ambulatory Visit | Attending: Family Medicine | Admitting: Family Medicine

## 2022-05-18 DIAGNOSIS — R0989 Other specified symptoms and signs involving the circulatory and respiratory systems: Secondary | ICD-10-CM | POA: Diagnosis not present

## 2022-05-18 LAB — VAS US ABI WITH/WO TBI
Left ABI: 1.19
Right ABI: 1.41

## 2022-05-27 NOTE — Telephone Encounter (Signed)
Is okay to stop Eliquis 2 days before procedure and resume the next day if there are no complications. Thanks, BJ.

## 2022-05-30 NOTE — Telephone Encounter (Signed)
Contacted pt & pt is aware and understanding it is ok to hold Eliquis 2 days prior to his procedure per PCP.

## 2022-08-25 ENCOUNTER — Encounter (HOSPITAL_COMMUNITY): Payer: Self-pay | Admitting: Internal Medicine

## 2022-09-03 NOTE — Anesthesia Preprocedure Evaluation (Addendum)
Anesthesia Evaluation  Patient identified by MRN, date of birth, ID band Patient awake    Reviewed: Allergy & Precautions, NPO status , Patient's Chart, lab work & pertinent test results  Airway Mallampati: IV  TM Distance: >3 FB Neck ROM: Full    Dental  (+) Teeth Intact, Dental Advisory Given   Pulmonary sleep apnea and Continuous Positive Airway Pressure Ventilation , PE (2021, 2022; eliquis LD 72h ago)   Pulmonary exam normal breath sounds clear to auscultation       Cardiovascular hypertension (1448/58 preop), Pt. on medications + DVT  Normal cardiovascular exam Rhythm:Regular Rate:Normal  Echo 2022  1. Technically difficult study with limited views   2. Left ventricular ejection fraction, by estimation, is 60 to 65%. The  left ventricle has normal function. The left ventricle has no regional  wall motion abnormalities.   3. Right ventricular systolic function was not well visualized. The right  ventricular size is not well visualized.   4. The mitral valve is grossly normal. No evidence of mitral valve  regurgitation.   5. The aortic valve was not well visualized. Aortic valve regurgitation  is not visualized.     Neuro/Psych negative neurological ROS  negative psych ROS   GI/Hepatic negative GI ROS, Neg liver ROS,,,  Endo/Other  diabetes, Well Controlled, Type 2  Morbid obesityBMI 58  Renal/GU negative Renal ROS  negative genitourinary   Musculoskeletal  (+) Arthritis , Osteoarthritis,    Abdominal  (+) + obese  Peds  Hematology Recurrent DVT/PE- was recommended to have hematology w/u but was never done   Anesthesia Other Findings Semaglutide inj LD: 2 weeks ago  Reproductive/Obstetrics negative OB ROS                             Anesthesia Physical Anesthesia Plan  ASA: 4  Anesthesia Plan: MAC   Post-op Pain Management:    Induction:   PONV Risk Score and Plan: 2 and  Propofol infusion and TIVA  Airway Management Planned: Natural Airway, Simple Face Mask and Nasal Cannula  Additional Equipment: None  Intra-op Plan:   Post-operative Plan:   Informed Consent: I have reviewed the patients History and Physical, chart, labs and discussed the procedure including the risks, benefits and alternatives for the proposed anesthesia with the patient or authorized representative who has indicated his/her understanding and acceptance.       Plan Discussed with: CRNA  Anesthesia Plan Comments: (Super morbid obesity- will attempt MAC w/ HFNC, however low threshold for converting to GA/LMA )       Anesthesia Quick Evaluation

## 2022-09-05 ENCOUNTER — Ambulatory Visit (HOSPITAL_BASED_OUTPATIENT_CLINIC_OR_DEPARTMENT_OTHER): Payer: 59 | Admitting: Anesthesiology

## 2022-09-05 ENCOUNTER — Other Ambulatory Visit: Payer: Self-pay

## 2022-09-05 ENCOUNTER — Encounter (HOSPITAL_COMMUNITY)
Admission: RE | Disposition: A | Discharge status: Home / Self Care | Payer: Self-pay | Source: Ambulatory Visit | Attending: Internal Medicine

## 2022-09-05 ENCOUNTER — Ambulatory Visit (HOSPITAL_COMMUNITY): Payer: 59 | Admitting: Anesthesiology

## 2022-09-05 ENCOUNTER — Ambulatory Visit (HOSPITAL_COMMUNITY)
Admission: RE | Admit: 2022-09-05 | Discharge: 2022-09-05 | Disposition: A | Discharge status: Home / Self Care | Payer: 59 | Source: Ambulatory Visit | Attending: Internal Medicine | Admitting: Internal Medicine

## 2022-09-05 DIAGNOSIS — G4730 Sleep apnea, unspecified: Secondary | ICD-10-CM | POA: Insufficient documentation

## 2022-09-05 DIAGNOSIS — Z1211 Encounter for screening for malignant neoplasm of colon: Secondary | ICD-10-CM | POA: Diagnosis not present

## 2022-09-05 DIAGNOSIS — D123 Benign neoplasm of transverse colon: Secondary | ICD-10-CM | POA: Insufficient documentation

## 2022-09-05 DIAGNOSIS — R195 Other fecal abnormalities: Secondary | ICD-10-CM

## 2022-09-05 DIAGNOSIS — K635 Polyp of colon: Secondary | ICD-10-CM

## 2022-09-05 DIAGNOSIS — Z79899 Other long term (current) drug therapy: Secondary | ICD-10-CM | POA: Insufficient documentation

## 2022-09-05 DIAGNOSIS — M1990 Unspecified osteoarthritis, unspecified site: Secondary | ICD-10-CM | POA: Diagnosis not present

## 2022-09-05 DIAGNOSIS — D126 Benign neoplasm of colon, unspecified: Secondary | ICD-10-CM

## 2022-09-05 DIAGNOSIS — E119 Type 2 diabetes mellitus without complications: Secondary | ICD-10-CM | POA: Diagnosis not present

## 2022-09-05 DIAGNOSIS — K648 Other hemorrhoids: Secondary | ICD-10-CM | POA: Insufficient documentation

## 2022-09-05 DIAGNOSIS — Z86711 Personal history of pulmonary embolism: Secondary | ICD-10-CM | POA: Insufficient documentation

## 2022-09-05 DIAGNOSIS — D120 Benign neoplasm of cecum: Secondary | ICD-10-CM | POA: Insufficient documentation

## 2022-09-05 DIAGNOSIS — D122 Benign neoplasm of ascending colon: Secondary | ICD-10-CM | POA: Diagnosis not present

## 2022-09-05 DIAGNOSIS — I10 Essential (primary) hypertension: Secondary | ICD-10-CM

## 2022-09-05 DIAGNOSIS — Z86718 Personal history of other venous thrombosis and embolism: Secondary | ICD-10-CM | POA: Diagnosis not present

## 2022-09-05 DIAGNOSIS — Z7985 Long-term (current) use of injectable non-insulin antidiabetic drugs: Secondary | ICD-10-CM | POA: Insufficient documentation

## 2022-09-05 DIAGNOSIS — Z6841 Body Mass Index (BMI) 40.0 and over, adult: Secondary | ICD-10-CM | POA: Insufficient documentation

## 2022-09-05 HISTORY — PX: POLYPECTOMY: SHX5525

## 2022-09-05 HISTORY — PX: COLONOSCOPY WITH PROPOFOL: SHX5780

## 2022-09-05 LAB — GLUCOSE, CAPILLARY
Glucose-Capillary: 118 mg/dL — ABNORMAL HIGH (ref 70–99)
Glucose-Capillary: 131 mg/dL — ABNORMAL HIGH (ref 70–99)

## 2022-09-05 SURGERY — COLONOSCOPY WITH PROPOFOL
Anesthesia: Monitor Anesthesia Care

## 2022-09-05 MED ORDER — PROPOFOL 10 MG/ML IV BOLUS
INTRAVENOUS | Status: DC | PRN
  Administered 2022-09-05: 50 mg via INTRAVENOUS
  Administered 2022-09-05: 20 mg via INTRAVENOUS

## 2022-09-05 MED ORDER — LACTATED RINGERS IV SOLN: INTRAVENOUS | Status: DC

## 2022-09-05 MED ORDER — LIDOCAINE 2% (20 MG/ML) 5 ML SYRINGE
INTRAMUSCULAR | Status: DC | PRN
  Administered 2022-09-05: 50 mg via INTRAVENOUS

## 2022-09-05 MED ORDER — PROPOFOL 500 MG/50ML IV EMUL
INTRAVENOUS | Status: AC
  Filled 2022-09-05: qty 50

## 2022-09-05 MED ORDER — PROPOFOL 500 MG/50ML IV EMUL
INTRAVENOUS | Status: DC | PRN
  Administered 2022-09-05: 120 ug/kg/min via INTRAVENOUS

## 2022-09-05 MED ORDER — SODIUM CHLORIDE 0.9 % IV SOLN: INTRAVENOUS | Status: DC

## 2022-09-05 MED ORDER — PROPOFOL 1000 MG/100ML IV EMUL
INTRAVENOUS | Status: AC
  Filled 2022-09-05: qty 100

## 2022-09-05 SURGICAL SUPPLY — 22 items

## 2022-09-05 NOTE — Anesthesia Postprocedure Evaluation (Signed)
Anesthesia Post Note  Patient: Earl Donovan  Procedure(s) Performed: COLONOSCOPY WITH PROPOFOL POLYPECTOMY     Patient location during evaluation: PACU Anesthesia Type: MAC Level of consciousness: awake and alert Pain management: pain level controlled Vital Signs Assessment: post-procedure vital signs reviewed and stable Respiratory status: spontaneous breathing, nonlabored ventilation and respiratory function stable Cardiovascular status: blood pressure returned to baseline and stable Postop Assessment: no apparent nausea or vomiting Anesthetic complications: no   No notable events documented.  Last Vitals:  Vitals:   09/05/22 0854 09/05/22 0902  BP:  (!) 125/58  Pulse: 62 61  Resp: 20 15  Temp:    SpO2: 96% 96%    Last Pain:  Vitals:   09/05/22 0902  TempSrc:   PainSc: 0-No pain                 Lannie Fields

## 2022-09-05 NOTE — Transfer of Care (Signed)
Immediate Anesthesia Transfer of Care Note  Patient: Earl Donovan  Procedure(s) Performed: COLONOSCOPY WITH PROPOFOL POLYPECTOMY  Patient Location: PACU  Anesthesia Type:MAC  Level of Consciousness: awake, alert , oriented, and patient cooperative  Airway & Oxygen Therapy: Patient Spontanous Breathing and Patient connected to face mask oxygen  Post-op Assessment: Report given to RN, Post -op Vital signs reviewed and stable, and Patient moving all extremities  Post vital signs: Reviewed and stable  Last Vitals:  Vitals Value Taken Time  BP    Temp    Pulse    Resp    SpO2      Last Pain:  Vitals:   09/05/22 0648  TempSrc: Temporal  PainSc: 0-No pain         Complications: No notable events documented.

## 2022-09-05 NOTE — H&P (Signed)
GASTROENTEROLOGY PROCEDURE H&P NOTE   Primary Care Physician: [place], Betty G, MD    Reason for Procedure:   Positive Cologuard  Plan:    Colonoscopy  Patient is appropriate for endoscopic procedure(s) in the hospital setting.  The nature of the procedure, as well as the risks, benefits, and alternatives were carefully and thoroughly reviewed with the patient. Ample time for discussion and questions allowed. The patient understood, was satisfied, and agreed to proceed.     HPI: Earl Donovan is a 57 y.o. male who presents for colonoscopy for evaluation of positive Cologuard .  Patient was most recently seen in the Gastroenterology Clinic on 05/11/22.  No interval change in medical history since that appointment. Please refer to that note for full details regarding GI history and clinical presentation.   Past Medical History:  Diagnosis Date   Arthritis    Lower back pain   DM (diabetes mellitus) (HCC)    Gout    Hyperlipidemia    Hypertension    Morbid obesity with BMI of 50.0-59.9, adult (HCC)    Pulmonary embolism (HCC)    Sleep apnea treated with continuous positive airway pressure (CPAP)    Stasis dermatitis     Past Surgical History:  Procedure Laterality Date   NO PAST SURGERIES      Prior to Admission medications   Medication Sig Start Date End Date Taking? Authorizing Provider  allopurinol (ZYLOPRIM) 300 MG tablet TAKE 1 TABLET BY MOUTH DAILY Patient taking differently: Take 300 mg by mouth every evening. 02/15/22  Yes [place], Betty G, MD  apixaban (ELIQUIS) 5 MG TABS tablet Take 1 tablet (5 mg total) by mouth 2 (two) times daily. 12/20/21  Yes [place], Betty G, MD  atorvastatin (LIPITOR) 20 MG tablet Take 1 tablet (20 mg total) by mouth daily. Patient taking differently: Take 20 mg by mouth every evening. 10/25/21  Yes [place], Betty G, MD  hydrochlorothiazide (HYDRODIURIL) 25 MG tablet Take 1 tablet (25 mg total) by mouth daily. Patient taking differently:  Take 25 mg by mouth every evening. 05/02/22  Yes [place], Betty G, MD  Na Sulfate-K Sulfate-Mg Sulf 17.5-3.13-1.6 GM/177ML SOLN Take 354 mLs by mouth once. 05/11/22  Yes [provider]  olmesartan (BENICAR) 40 MG tablet Take 1 tablet (40 mg total) by mouth daily. Patient taking differently: Take 40 mg by mouth every evening. 05/02/22  Yes [place], Betty G, MD  Semaglutide, 1 MG/DOSE, 4 MG/3ML SOPN Inject 1 mg as directed once a week. Patient taking differently: Inject 1 mg as directed every Sunday. 01/12/22  Yes [place], Betty G, MD    Current Facility-Administered Medications  Medication Dose Route Frequency Provider Last Rate Last Admin   0.9 %  sodium chloride infusion   Intravenous Continuous Riley Kill Malachi Carl, NP       lactated ringers infusion   Intravenous Continuous Imogene Burn, MD   New Bag at 09/05/22 [phone]    Allergies as of 05/11/2022   (No Known Allergies)    Family History  Problem Relation Age of Onset   Arthritis Mother    Breast cancer Mother    Melanoma Father    Anxiety disorder Brother    Hyperlipidemia Brother    Asthma Maternal Grandmother    Asthma Maternal Grandfather    Dementia Maternal Grandfather    Cancer Paternal Grandmother        type unknown   Clotting disorder Neg Hx     Social History   Socioeconomic  History   Marital status: Married    Spouse name: Not on file   Number of children: 0   Years of education: Not on file   Highest education level: Associate degree: occupational, Scientist, product/process development, or vocational program  Occupational History   Occupation: Engineer, water  Tobacco Use   Smoking status: Never   Smokeless tobacco: Never  Vaping Use   Vaping status: Never Used  Substance and Sexual Activity   Alcohol use: Yes    Comment: occasional   Drug use: No   Sexual activity: Yes  Other Topics Concern   Not on file  Social History Narrative   Not on file   Social Determinants of Health   Financial Resource Strain: Low  Risk  (04/22/2021)   Overall Financial Resource Strain (CARDIA)    Difficulty of Paying Living Expenses: Not very hard  Food Insecurity: No Food Insecurity (04/22/2021)   Hunger Vital Sign    Worried About Running Out of Food in the Last Year: Never true    Ran Out of Food in the Last Year: Never true  Transportation Needs: No Transportation Needs (04/22/2021)   PRAPARE - Administrator, Civil Service (Medical): No    Lack of Transportation (Non-Medical): No  Physical Activity: Insufficiently Active (04/22/2021)   Exercise Vital Sign    Days of Exercise per Week: 3 days    Minutes of Exercise per Session: 20 min  Stress: No Stress Concern Present (04/22/2021)   [employer] of Occupational Health - Occupational Stress Questionnaire    Feeling of Stress : Only a little  Social Connections: Socially Integrated (04/22/2021)   Social Connection and Isolation Panel [NHANES]    Frequency of Communication with Friends and Family: More than three times a week    Frequency of Social Gatherings with Friends and Family: More than three times a week    Attends Religious Services: 1 to 4 times per year    Active Member of [employer] or Organizations: Yes    Attends Banker Meetings: 1 to 4 times per year    Marital Status: Married  Catering manager Violence: Not on file    Physical Exam: Vital signs in last 24 hours: BP (!) 148/58   Pulse 72   Temp (!) 97 F (36.1 C) (Temporal)   Resp 20   Ht 6' (1.829 m)   Wt (!) 179.2 kg   SpO2 100%   BMI 53.57 kg/m  GEN: NAD EYE: Sclerae anicteric ENT: MMM CV: Non-tachycardic Pulm: No increased WOB GI: Soft NEURO:  Alert & Oriented   Eulah Pont, MD [facility]   09/05/2022 7:55 AM

## 2022-09-05 NOTE — Discharge Instructions (Signed)
YOU HAD AN ENDOSCOPIC PROCEDURE TODAY: Refer to the procedure report and other information in the discharge instructions given to you for any specific questions about what was found during the examination. If this information does not answer your questions, please call [facility] office at [phone] to clarify.   YOU SHOULD EXPECT: Some feelings of bloating in the abdomen. Passage of more gas than usual. Walking can help get rid of the air that was put into your GI tract during the procedure and reduce the bloating. If you had a lower endoscopy (such as a colonoscopy or flexible sigmoidoscopy) you may notice spotting of blood in your stool or on the toilet paper. Some abdominal soreness may be present for a day or two, also.  DIET: Your first meal following the procedure should be a light meal and then it is ok to progress to your normal diet. A half-sandwich or bowl of soup is an example of a good first meal. Heavy or fried foods are harder to digest and may make you feel nauseous or bloated. Drink plenty of fluids but you should avoid alcoholic beverages for 24 hours. If you had a esophageal dilation, please see attached instructions for diet.    ACTIVITY: Your care partner should take you home directly after the procedure. You should plan to take it easy, moving slowly for the rest of the day. You can resume normal activity the day after the procedure however YOU SHOULD NOT DRIVE, use power tools, machinery or perform tasks that involve climbing or major physical exertion for 24 hours (because of the sedation medicines used during the test).   SYMPTOMS TO REPORT IMMEDIATELY: A gastroenterologist can be reached at any hour. Please call [phone]  for any of the following symptoms:  Following lower endoscopy (colonoscopy, flexible sigmoidoscopy) Excessive amounts of blood in the stool  Significant tenderness, worsening of abdominal pains  Swelling of the abdomen that is new, acute  Fever of 100 or  higher   ***Restart Eliquis 7/23   FOLLOW UP:  If any biopsies were taken you will be contacted by phone or by letter within the next 1-3 weeks. Call [phone]  if you have not heard about the biopsies in 3 weeks.  Please also call with any specific questions about appointments or follow up tests.

## 2022-09-05 NOTE — Op Note (Addendum)
[facility] Patient Name: Earl Donovan Procedure Date: 09/05/2022 MRN: [id number] Attending MD: Particia Lather , , [id number] Date of Birth: 1965-03-04 CSN: [id number] Age: 57 Admit Type: Outpatient Procedure:                Colonoscopy Indications:              Positive Cologuard test Providers:                Madelyn Brunner" Gennaro Africa, RN, Adin Hector, RN, Marja Kays,                            Technician Referring MD:             Betty G. [place] Medicines:                Monitored Anesthesia Care Complications:            No immediate complications. Estimated Blood Loss:     Estimated blood loss was minimal. Procedure:                Pre-Anesthesia Assessment:                           - Prior to the procedure, a History and Physical                            was performed, and patient medications and                            allergies were reviewed. The patient's tolerance of                            previous anesthesia was also reviewed. The risks                            and benefits of the procedure and the sedation                            options and risks were discussed with the patient.                            All questions were answered, and informed consent                            was obtained. Prior Anticoagulants: The patient has                            taken Eliquis (apixaban), last dose was 3 days                            prior to procedure. ASA Grade Assessment: III - A  patient with severe systemic disease. After                            reviewing the risks and benefits, the patient was                            deemed in satisfactory condition to undergo the                            procedure.                           After obtaining informed consent, the colonoscope                            was passed under direct vision.  Throughout the                            procedure, the patient's blood pressure, pulse, and                            oxygen saturations were monitored continuously. The                            [id number]-HQ190L ([id number]) Olympus colonoscope was                            introduced through the anus and advanced to the the                            cecum, identified by appendiceal orifice and                            ileocecal valve. The colonoscopy was performed                            without difficulty. The patient tolerated the                            procedure well. The quality of the bowel                            preparation was excellent. The ileocecal valve,                            appendiceal orifice, and rectum were photographed. Scope In: 8:09:19 AM Scope Out: 8:32:56 AM Scope Withdrawal Time: 0 hours 13 minutes 44 seconds  Total Procedure Duration: 0 hours 23 minutes 37 seconds  Findings:      Five sessile polyps were found in the transverse colon, ascending colon       and cecum. The polyps were 3 to 8 mm in size. These polyps were removed       with a cold snare. Resection and retrieval were complete.      A 3 mm polyp was found in the sigmoid colon.  The polyp was sessile. The       polyp was removed with a cold snare. Resection and retrieval were       complete.      Non-bleeding internal hemorrhoids were found during retroflexion. Impression:               - Five 3 to 8 mm polyps in the transverse colon, in                            the ascending colon and in the cecum, removed with                            a cold snare. Resected and retrieved.                           - One 3 mm polyp in the sigmoid colon, removed with                            a cold snare. Resected and retrieved.                           - Non-bleeding internal hemorrhoids. Moderate Sedation:      Not Applicable - Patient had care per Anesthesia. Recommendation:           -  Discharge patient to home (with escort).                           - Await pathology results.                           - Okay to restart Eliquis tomorrow.                           - The findings and recommendations were discussed                            with the patient. Procedure Code(s):        --- Professional ---                           [phone], Colonoscopy, flexible; with removal of                            tumor(s), polyp(s), or other lesion(s) by snare                            technique Diagnosis Code(s):        --- Professional ---                           D12.3, Benign neoplasm of transverse colon (hepatic                            flexure or splenic flexure)  D12.2, Benign neoplasm of ascending colon                           D12.0, Benign neoplasm of cecum                           D12.5, Benign neoplasm of sigmoid colon                           K64.8, Other hemorrhoids                           R19.5, Other fecal abnormalities CPT copyright 2022 American Medical Association. All rights reserved. The codes documented in this report are preliminary and upon coder review may  be revised to meet current compliance requirements. Dr Particia Lather "Alan Ripper" Leonides Schanz,  09/05/2022 8:39:45 AM Number of Addenda: 0

## 2022-09-06 ENCOUNTER — Encounter: Payer: Self-pay | Admitting: Internal Medicine

## 2022-09-06 ENCOUNTER — Encounter (HOSPITAL_COMMUNITY): Payer: Self-pay | Admitting: Internal Medicine

## 2022-09-06 LAB — SURGICAL PATHOLOGY

## 2022-09-29 ENCOUNTER — Encounter (INDEPENDENT_AMBULATORY_CARE_PROVIDER_SITE_OTHER): Payer: Self-pay

## 2022-11-01 NOTE — Progress Notes (Incomplete)
HPI: Earl Donovan is a 57 y.o. male  with PMHx significant for HTN, pulmonary embolism, HLD, DM2, who is here today for chronic disease management.  Last seen on 05/02/2022 No new problems since his last visit. Since his last visit he underwent colonoscopy, polypectomy performed and it was recommended 3 years follow up.  Hypertension:  Medications: hydrochlorothiazide 25mg  (taking every other day due to cramps), Olmesartan 40mg  BP readings at home: Checking infrequently at home Negative for unusual or severe headache, visual changes, exertional chest pain, dyspnea,  focal weakness, or worsening edema. OSA on CPAP.  Lab Results  Component Value Date   CREATININE 0.71 05/02/2022   BUN 11 05/02/2022   NA 132 (L) 05/02/2022   K 3.9 05/02/2022   CL 99 05/02/2022   CO2 23 05/02/2022   Hyperlipidemia: Currently on Atorvastatin 20mg  Side effects from medication: none Lab Results  Component Value Date   CHOL 120 05/02/2022   HDL 39.20 05/02/2022   LDLCALC 67 05/02/2022   TRIG 69.0 05/02/2022   CHOLHDL 3 05/02/2022   Diabetes Mellitus II: Dx'ed in 10/2027. Checking BG at home: Checking occasionally, not frequently, does not remember readings. Medications: ozempic 1 mg weekly. Diet: Patient states he has had no recent changes in diet Exercise: Reports "very little" recent exercise Eye exam: UTD on visual care, last appointment was 3 months ago Foot exam: last foot exam was completed in last visit in March Negative for symptoms of hypoglycemia, polyuria, polydipsia, numbness extremities, foot ulcers/trauma HgA1C on 05/02/22 was 6.1  Lab Results  Component Value Date   MICROALBUR <0.7 10/25/2021   Gout:He has not had an episode in years. He is taking Allopurinol 300mg .  Venous stasis dermatitis:He has been consistently using compression socks. Stable. Topical steroid daily as needed has helped, does not recall name of medication.  Hx of PE: he is on Eliquis 5 mg  bid. He ahs not noted more bruising than usual.gum/nose bleeding, or melena.  Review of Systems  Constitutional:  Negative for activity change, appetite change and fever.  HENT:  Negative for nosebleeds and sore throat.   Respiratory:  Negative for cough and wheezing.   Gastrointestinal:  Negative for abdominal pain, nausea and vomiting.  Genitourinary:  Negative for decreased urine volume, dysuria and hematuria.  Neurological:  Negative for syncope and facial asymmetry.  See other pertinent positives and negatives in HPI.  Current Outpatient Medications on File Prior to Visit  Medication Sig Dispense Refill   allopurinol (ZYLOPRIM) 300 MG tablet TAKE 1 TABLET BY MOUTH DAILY (Patient taking differently: Take 300 mg by mouth every evening.) 90 tablet 3   apixaban (ELIQUIS) 5 MG TABS tablet Take 1 tablet (5 mg total) by mouth 2 (two) times daily. 180 tablet 3   atorvastatin (LIPITOR) 20 MG tablet Take 1 tablet (20 mg total) by mouth daily. (Patient taking differently: Take 20 mg by mouth every evening.) 90 tablet 3   hydrochlorothiazide (HYDRODIURIL) 25 MG tablet Take 1 tablet (25 mg total) by mouth daily. (Patient taking differently: Take 25 mg by mouth every evening.) 90 tablet 2   Na Sulfate-K Sulfate-Mg Sulf 17.5-3.13-1.6 GM/177ML SOLN Take 354 mLs by mouth once.     olmesartan (BENICAR) 40 MG tablet Take 1 tablet (40 mg total) by mouth daily. (Patient taking differently: Take 40 mg by mouth every evening.) 90 tablet 1   Semaglutide, 1 MG/DOSE, 4 MG/3ML SOPN Inject 1 mg as directed once a week. (Patient taking differently: Inject 1 mg  as directed every Sunday.) 3 mL 3   No current facility-administered medications on file prior to visit.    Past Medical History:  Diagnosis Date   Arthritis    Lower back pain   DM (diabetes mellitus) (HCC)    Gout    Hyperlipidemia    Hypertension    Morbid obesity with BMI of 50.0-59.9, adult (HCC)    Pulmonary embolism (HCC)    Sleep apnea  treated with continuous positive airway pressure (CPAP)    Stasis dermatitis    No Known Allergies  Social History   Socioeconomic History   Marital status: Married    Spouse name: Not on file   Number of children: 0   Years of education: Not on file   Highest education level: Some college, no degree  Occupational History   Occupation: Engineer, water  Tobacco Use   Smoking status: Never   Smokeless tobacco: Never  Vaping Use   Vaping status: Never Used  Substance and Sexual Activity   Alcohol use: Yes    Comment: occasional   Drug use: No   Sexual activity: Yes  Other Topics Concern   Not on file  Social History Narrative   Not on file   Social Determinants of Health   Financial Resource Strain: Medium Risk (11/01/2022)   Overall Financial Resource Strain (CARDIA)    Difficulty of Paying Living Expenses: Somewhat hard  Food Insecurity: No Food Insecurity (11/01/2022)   Hunger Vital Sign    Worried About Running Out of Food in the Last Year: Never true    Ran Out of Food in the Last Year: Never true  Transportation Needs: No Transportation Needs (11/01/2022)   PRAPARE - Administrator, Civil Service (Medical): No    Lack of Transportation (Non-Medical): No  Physical Activity: Insufficiently Active (11/01/2022)   Exercise Vital Sign    Days of Exercise per Week: 2 days    Minutes of Exercise per Session: 10 min  Stress: No Stress Concern Present (11/01/2022)   [facility]arl [facility] of Occupational Health - Occupational Stress Questionnaire    Feeling of Stress : Only a little  Social Connections: Unknown (11/01/2022)   Social Connection and Isolation Panel [NHANES]    Frequency of Communication with Friends and Family: Three times a week    Frequency of Social Gatherings with Friends and Family: Once a week    Attends Religious Services: Patient declined    Database administrator or Organizations: No    Attends Banker Meetings: Not on file     Marital Status: Married    Vitals:   11/02/22 0758  BP: 118/70  Pulse: 85  Resp: 16  Temp: 98.1 F (36.7 C)  SpO2: 98%   Body mass index is 55.06 kg/m.  Physical Exam Vitals and nursing note reviewed.  Constitutional:      General: He is not in acute distress.    Appearance: He is well-developed.  HENT:     Head: Normocephalic.     Mouth/Throat:     Mouth: Mucous membranes are moist.     Pharynx: Oropharynx is clear.  Eyes:     Conjunctiva/sclera: Conjunctivae normal.  Cardiovascular:     Rate and Rhythm: Normal rate and regular rhythm.     Heart sounds: No murmur heard.    Comments: Lymphedema LE's, bilateral. Trace pitting LE and peri ankle edema,bilateral. DP pulses palpable.  Pulmonary:     Effort: Pulmonary effort is normal. No  respiratory distress.     Breath sounds: Normal breath sounds.  Abdominal:     Palpations: Abdomen is soft. There is no hepatomegaly or mass.     Tenderness: There is no abdominal tenderness.  Lymphadenopathy:     Cervical: No cervical adenopathy.  Skin:    General: Skin is warm.     Findings: No erythema or rash.     Comments: Varicose veins LE, bilateral and skin chronic vein disease changes: Hyperpigmentation and crusty areas on anterior and lateral aspects of distal LE's.  Neurological:     Mental Status: He is alert and oriented to person, place, and time.     Cranial Nerves: No cranial nerve deficit.     Gait: Gait normal.  Psychiatric:        Mood and Affect: Mood and affect normal.   ASSESSMENT AND PLAN:  Mr. Tierrablanca was seen today for follow up..   Orders Placed This Encounter  Procedures   Microalbumin / creatinine urine ratio   Basic metabolic panel   Uric acid   Uric acid, random urine   POC HgB A1c   Lab Results  Component Value Date   HGBA1C 6.2 11/02/2022   Lab Results  Component Value Date   NA 132 (L) 11/02/2022   CL 97 11/02/2022   K 4.3 11/02/2022   CO2 26 11/02/2022   BUN 8 11/02/2022    CREATININE 0.84 11/02/2022   GFR 96.80 11/02/2022   CALCIUM 9.5 11/02/2022   ALBUMIN 3.8 05/02/2022   GLUCOSE 120 (H) 11/02/2022   Lab Results  Component Value Date   MICROALBUR <0.7 11/02/2022   MICROALBUR <0.7 10/25/2021   Lab Results  Component Value Date   LABURIC 3.8 (L) 11/02/2022   Type 2 diabetes mellitus with other specified complication, without long-term current use of insulin (HCC) Assessment & Plan: HgA1C at goal, went from 6.1 to 6.2 Continue Ozempic 1 mg weekly. Regular exercise as tolerated and voidance of added sugar food intake encouraged. Annual eye exam, periodic dental and foot care to continue. F/U in 5-6 months  Orders: -     POCT glycosylated hemoglobin (Hb A1C) -     Microalbumin / creatinine urine ratio; Future -     Basic metabolic panel; Future  Essential hypertension, benign Assessment & Plan: BP adequately controlled. Continue Olmesartan 40 mg daily. Taking hydrochlorothiazide 25 mg every other day, mainly for edema. Recommend monitoring BP regularly. Eye exam is current.   Bilateral pulmonary embolism [facility]) Assessment & Plan: PE in 07/2019 and 07/2020. Continue Eliquis 5 mg bid, which he has tolerated well.   Venous stasis dermatitis of both lower extremities Assessment & Plan: Stable. Continue good skin care and wearing compression stockings. Triamcinolone cream bid as needed.  Orders: -     Triamcinolone Acetonide; Apply 1 Application topically 2 (two) times daily as needed.  Dispense: 45 g; Refill: 1  Gout, arthropathy Assessment & Plan: Problem is well controlled, has not had an exacerbation in years. Continue low purine diet. Currently on Allopurinol 300 mg daily.  Orders: -     Uric acid; Future -     Uric acid, random urine; Future   Return in about 6 months (around 05/02/2023) for chronic problems.  I,Rachel Rivera,acting as a scribe for Cyndra Feinberg [place], MD.,have documented all relevant documentation on the behalf of  Daquan Crapps [place], MD,as directed by  Lilton Pare [place], MD while in the presence of Suhana Wilner [place], MD.  I, Isabelle Course, have reviewed all documentation  for this visit. The documentation on 11/02/22 for the exam, diagnosis, procedures, and orders are all accurate and complete.  Jenaro Souder G. [place], MD  [facility]. [place] office.

## 2022-11-02 ENCOUNTER — Ambulatory Visit: Payer: 59 | Admitting: Family Medicine

## 2022-11-02 ENCOUNTER — Encounter: Payer: Self-pay | Admitting: Family Medicine

## 2022-11-02 VITALS — BP 118/70 | HR 85 | Temp 98.1°F | Resp 16 | Ht 72.0 in | Wt >= 6400 oz

## 2022-11-02 DIAGNOSIS — I872 Venous insufficiency (chronic) (peripheral): Secondary | ICD-10-CM

## 2022-11-02 DIAGNOSIS — I2699 Other pulmonary embolism without acute cor pulmonale: Secondary | ICD-10-CM | POA: Diagnosis not present

## 2022-11-02 DIAGNOSIS — E1169 Type 2 diabetes mellitus with other specified complication: Secondary | ICD-10-CM

## 2022-11-02 DIAGNOSIS — M109 Gout, unspecified: Secondary | ICD-10-CM | POA: Diagnosis not present

## 2022-11-02 DIAGNOSIS — Z7985 Long-term (current) use of injectable non-insulin antidiabetic drugs: Secondary | ICD-10-CM

## 2022-11-02 DIAGNOSIS — I10 Essential (primary) hypertension: Secondary | ICD-10-CM

## 2022-11-02 LAB — POCT GLYCOSYLATED HEMOGLOBIN (HGB A1C): HbA1c, POC (prediabetic range): 6.2 % (ref 5.7–6.4)

## 2022-11-02 LAB — BASIC METABOLIC PANEL WITH GFR
BUN: 8 mg/dL (ref 6–23)
CO2: 26 meq/L (ref 19–32)
Calcium: 9.5 mg/dL (ref 8.4–10.5)
Chloride: 97 meq/L (ref 96–112)
Creatinine, Ser: 0.84 mg/dL (ref 0.40–1.50)
GFR: 96.8 mL/min (ref >60.00)
Glucose, Bld: 120 mg/dL — ABNORMAL HIGH (ref 70–99)
Potassium: 4.3 meq/L (ref 3.5–5.1)
Sodium: 132 meq/L — ABNORMAL LOW (ref 135–145)

## 2022-11-02 LAB — URIC ACID: Uric Acid, Serum: 3.8 mg/dL — ABNORMAL LOW (ref 4.0–7.8)

## 2022-11-02 LAB — MICROALBUMIN / CREATININE URINE RATIO
Creatinine,U: 80.9 mg/dL
Microalb Creat Ratio: 0.9 mg/g (ref 0.0–30.0)
Microalb, Ur: <0.7 mg/dL (ref 0.0–1.9)

## 2022-11-02 MED ORDER — TRIAMCINOLONE ACETONIDE 0.1 % EX CREA
1.0000 | TOPICAL_CREAM | Freq: Two times a day (BID) | CUTANEOUS | 1 refills | Status: DC | PRN
Start: 2022-11-02 — End: 2023-05-03

## 2022-11-02 NOTE — Patient Instructions (Addendum)
A few things to remember from today's visit:  Type 2 diabetes mellitus with other specified complication, without long-term current use of insulin (HCC) - Plan: POC HgB A1c  Essential hypertension, benign  Bilateral pulmonary embolism (HCC)  No changes today.  If you need refills for medications you take chronically, please call your pharmacy. Do not use My Chart to request refills or for acute issues that need immediate attention. If you send a my chart message, it may take a few days to be addressed, specially if I am not in the office.  Please be sure medication list is accurate. If a new problem present, please set up appointment sooner than planned today.

## 2022-11-03 LAB — URIC ACID, RANDOM URINE: Uric Acid, Urine: 16 mg/dL

## 2022-11-03 NOTE — Assessment & Plan Note (Signed)
PE in 07/2019 and 07/2020. Continue Eliquis 5 mg bid, which he has tolerated well.

## 2022-11-03 NOTE — Assessment & Plan Note (Signed)
Stable. Continue good skin care and wearing compression stockings. Triamcinolone cream bid as needed.

## 2022-11-03 NOTE — Assessment & Plan Note (Signed)
BP adequately controlled. Continue Olmesartan 40 mg daily. Taking hydrochlorothiazide 25 mg every other day, mainly for edema. Recommend monitoring BP regularly. Eye exam is current.

## 2022-11-03 NOTE — Assessment & Plan Note (Signed)
HgA1C at goal, went from 6.1 to 6.2 Continue Ozempic 1 mg weekly. Regular exercise as tolerated and voidance of added sugar food intake encouraged. Annual eye exam, periodic dental and foot care to continue. F/U in 5-6 months

## 2022-11-03 NOTE — Assessment & Plan Note (Addendum)
Problem is well controlled, has not had an exacerbation in years. Continue low purine diet. Currently on Allopurinol 300 mg daily.

## 2023-01-08 ENCOUNTER — Other Ambulatory Visit: Payer: Self-pay | Admitting: Family Medicine

## 2023-01-08 DIAGNOSIS — E1169 Type 2 diabetes mellitus with other specified complication: Secondary | ICD-10-CM

## 2023-01-23 ENCOUNTER — Other Ambulatory Visit: Payer: Self-pay | Admitting: Family Medicine

## 2023-01-23 DIAGNOSIS — I10 Essential (primary) hypertension: Secondary | ICD-10-CM

## 2023-02-19 ENCOUNTER — Other Ambulatory Visit: Payer: Self-pay | Admitting: Family Medicine

## 2023-02-19 DIAGNOSIS — M109 Gout, unspecified: Secondary | ICD-10-CM

## 2023-03-16 ENCOUNTER — Other Ambulatory Visit: Payer: Self-pay | Admitting: Family Medicine

## 2023-05-03 ENCOUNTER — Encounter: Payer: Self-pay | Admitting: Family Medicine

## 2023-05-03 ENCOUNTER — Ambulatory Visit: Payer: 59 | Admitting: Family Medicine

## 2023-05-03 VITALS — BP 126/70 | HR 90 | Resp 16 | Ht 72.0 in | Wt >= 6400 oz

## 2023-05-03 DIAGNOSIS — I872 Venous insufficiency (chronic) (peripheral): Secondary | ICD-10-CM

## 2023-05-03 DIAGNOSIS — Z7985 Long-term (current) use of injectable non-insulin antidiabetic drugs: Secondary | ICD-10-CM

## 2023-05-03 DIAGNOSIS — I10 Essential (primary) hypertension: Secondary | ICD-10-CM | POA: Diagnosis not present

## 2023-05-03 DIAGNOSIS — E1169 Type 2 diabetes mellitus with other specified complication: Secondary | ICD-10-CM

## 2023-05-03 DIAGNOSIS — Z23 Encounter for immunization: Secondary | ICD-10-CM | POA: Diagnosis not present

## 2023-05-03 DIAGNOSIS — I2699 Other pulmonary embolism without acute cor pulmonale: Secondary | ICD-10-CM

## 2023-05-03 LAB — POCT GLYCOSYLATED HEMOGLOBIN (HGB A1C): HbA1c, POC (prediabetic range): 6.4 % (ref 5.7–6.4)

## 2023-05-03 LAB — MICROALBUMIN / CREATININE URINE RATIO
Creatinine,U: 144.1 mg/dL
Microalb Creat Ratio: 10.1 mg/g (ref 0.0–30.0)
Microalb, Ur: 1.5 mg/dL (ref 0.0–1.9)

## 2023-05-03 MED ORDER — AMMONIUM LACTATE 12 % EX LOTN: 1.0000 | TOPICAL_LOTION | Freq: Every day | CUTANEOUS | 0 refills | Status: AC | PRN

## 2023-05-03 MED ORDER — TRIAMCINOLONE ACETONIDE 0.5 % EX OINT: 1.0000 | TOPICAL_OINTMENT | Freq: Two times a day (BID) | CUTANEOUS | 1 refills | Status: AC | PRN

## 2023-05-03 NOTE — Progress Notes (Signed)
 HPI: Earl Donovan is a 58 y.o. male with a PMHx significant for HTN, PE, HLD, and DM II, who is here today for chronic disease management.  Last seen on 11/02/2022. No new problems or concerns since his last visit.   Hypertension: Currently on hydrochlorothiazide 25 mg every other day and olmesartan 40 mg daily.  BP readings at home: He checks his BP  occasionally and says his readings are usually close to 130/80. Negative for unusual or severe headache, visual changes, exertional chest pain, dyspnea, palpitations, focal weakness, or edema.  Lab Results  Component Value Date   CREATININE 0.84 11/02/2022   BUN 8 11/02/2022   NA 132 (L) 11/02/2022   K 4.3 11/02/2022   CL 97 11/02/2022   CO2 26 11/02/2022   Hyperlipidemia: Currently on atorvastatin 20 mg daily.  Side effects from medication: none Lab Results  Component Value Date   CHOL 120 05/02/2022   HDL 39.20 05/02/2022   LDLCALC 67 05/02/2022   TRIG 69.0 05/02/2022   CHOLHDL 3 05/02/2022   Diabetes Mellitus II:  Dx'ed in 10/2017. - Checking BG at home: He rarely checks his blood sugar.  - Medications: Currently on Ozempic 1 mg weekly. He feels like it took a long time for it to begin working, but now believes it is working well.  - eye exam: 08/2022.  - foot exam: Performed today.  - Negative for symptoms of hypoglycemia, polyuria, polydipsia, numbness extremities, foot ulcers/trauma  Lab Results  Component Value Date   HGBA1C 6.2 05/03/2023   Lab Results  Component Value Date   MICROALBUR <0.7 11/02/2022   Hx of PE:  Currently on Eliquis 5 mg twice daily. Denies more bruising than usual, gum/nose bleeding, blood in his stool, melena,or gross hematuria.   Gout:  Currently on allopurinol 300 mg daily. No concerns in this regard.  Venous stasis dermatitis: He says his legs are doing well currently and he has no ulcers.  He uses his triamcinolone cream occasionally but not daily.   OSA: He still sleeps  with his CPAP.   Review of Systems  Constitutional:  Negative for activity change, appetite change, chills and fever.  HENT:  Negative for sore throat and trouble swallowing.   Respiratory:  Negative for cough and wheezing.   Cardiovascular:  Negative for chest pain and palpitations.  Gastrointestinal:  Negative for abdominal pain, nausea and vomiting.  Genitourinary:  Negative for decreased urine volume and dysuria.  Musculoskeletal:  Positive for arthralgias.  Skin:  Negative for wound.  Neurological:  Negative for syncope, facial asymmetry and weakness.  See other pertinent positives and negatives in HPI.  Current Outpatient Medications on File Prior to Visit  Medication Sig Dispense Refill   allopurinol (ZYLOPRIM) 300 MG tablet TAKE 1 TABLET BY MOUTH DAILY 90 tablet 3   atorvastatin (LIPITOR) 20 MG tablet Take 1 tablet (20 mg total) by mouth daily. (Patient taking differently: Take 20 mg by mouth every evening.) 90 tablet 3   ELIQUIS 5 MG TABS tablet TAKE 1 TABLET(5 MG) BY MOUTH TWICE DAILY 180 tablet 3   hydrochlorothiazide (HYDRODIURIL) 25 MG tablet Take 1 tablet (25 mg total) by mouth daily. (Patient taking differently: Take 25 mg by mouth every evening.) 90 tablet 2   Na Sulfate-K Sulfate-Mg Sulf 17.5-3.13-1.6 GM/177ML SOLN Take 354 mLs by mouth once.     olmesartan (BENICAR) 40 MG tablet TAKE 1 TABLET(40 MG) BY MOUTH DAILY 90 tablet 2   OZEMPIC, 1 MG/DOSE, 4  MG/3ML SOPN INJECT 1 MG UNDER THE SKIN ONCE A WEEK AS DIRECTED 3 mL 3   No current facility-administered medications on file prior to visit.    Past Medical History:  Diagnosis Date   Arthritis    Lower back pain   DM (diabetes mellitus) (HCC)    Gout    Hyperlipidemia    Hypertension    Morbid obesity with BMI of 50.0-59.9, adult (HCC)    Pulmonary embolism (HCC)    Sleep apnea treated with continuous positive airway pressure (CPAP)    Stasis dermatitis    No Known Allergies  Social History   Socioeconomic  History   Marital status: Married    Spouse name: Not on file   Number of children: 0   Years of education: Not on file   Highest education level: Associate degree: occupational, Scientist, product/process development, or vocational program  Occupational History   Occupation: Engineer, water  Tobacco Use   Smoking status: Never   Smokeless tobacco: Never  Vaping Use   Vaping status: Never Used  Substance and Sexual Activity   Alcohol use: Yes    Comment: occasional   Drug use: No   Sexual activity: Yes  Other Topics Concern   Not on file  Social History Narrative   Not on file   Social Drivers of Health   Financial Resource Strain: Medium Risk (05/02/2023)   Overall Financial Resource Strain (CARDIA)    Difficulty of Paying Living Expenses: Somewhat hard  Food Insecurity: No Food Insecurity (05/02/2023)   Hunger Vital Sign    Worried About Running Out of Food in the Last Year: Never true    Ran Out of Food in the Last Year: Never true  Transportation Needs: No Transportation Needs (05/02/2023)   PRAPARE - Administrator, Civil Service (Medical): No    Lack of Transportation (Non-Medical): No  Physical Activity: Insufficiently Active (05/02/2023)   Exercise Vital Sign    Days of Exercise per Week: 3 days    Minutes of Exercise per Session: 30 min  Stress: No Stress Concern Present (05/02/2023)   [employer] of Occupational Health - Occupational Stress Questionnaire    Feeling of Stress : Not at all  Social Connections: Moderately Integrated (05/02/2023)   Social Connection and Isolation Panel [NHANES]    Frequency of Communication with Friends and Family: More than three times a week    Frequency of Social Gatherings with Friends and Family: Twice a week    Attends Religious Services: 1 to 4 times per year    Active Member of [employer] or Organizations: No    Attends Banker Meetings: Not on file    Marital Status: Married   Vitals:   05/03/23 1354  BP: 126/70  Pulse: 90   Resp: 16  SpO2: 98%   Body mass index is 55.61 kg/m.  Physical Exam Vitals and nursing note reviewed.  Constitutional:      General: He is not in acute distress.    Appearance: He is well-developed.  HENT:     Head: Normocephalic and atraumatic.     Mouth/Throat:     Mouth: Mucous membranes are moist.     Pharynx: Oropharynx is clear. Uvula midline.  Eyes:     Conjunctiva/sclera: Conjunctivae normal.  Cardiovascular:     Rate and Rhythm: Normal rate and regular rhythm.     Pulses:          Dorsalis pedis pulses are 2+ on the right  side and 2+ on the left side.     Heart sounds: No murmur heard.    Comments:  Lymphedema LE, bilateral. Trace pitting LE and peri ankle edema,bilateral.  Pulmonary:     Effort: Pulmonary effort is normal. No respiratory distress.     Breath sounds: Normal breath sounds.  Abdominal:     Palpations: Abdomen is soft. There is no mass.     Tenderness: There is no abdominal tenderness.  Musculoskeletal:     Right lower leg: Edema present.     Left lower leg: Edema present.  Lymphadenopathy:     Cervical: No cervical adenopathy.  Skin:    General: Skin is warm.     Findings: No erythema or rash.     Comments: Venous stasis dermatitis like changes, no erythema. Hyperkeratosis mainly on pretibial areas.  Neurological:     Mental Status: He is alert and oriented to person, place, and time.     Cranial Nerves: No cranial nerve deficit.     Gait: Gait normal.  Psychiatric:        Mood and Affect: Mood and affect normal.   ASSESSMENT AND PLAN:  Earl Donovan was seen today for chronic disease management.   Orders Placed This Encounter  Procedures   Microalbumin / creatinine urine ratio   Comprehensive metabolic panel   POC HgB A1c   Lab Results  Component Value Date   HGBA1C 6.4 05/03/2023   Lab Results  Component Value Date   NA 133 (L) 05/03/2023   CL 96 05/03/2023   K 4.4 05/03/2023   CO2 30 05/03/2023   BUN 10 05/03/2023    CREATININE 0.84 05/03/2023   GFR 96.46 05/03/2023   CALCIUM 9.4 05/03/2023   ALBUMIN 4.2 05/03/2023   GLUCOSE 138 (H) 05/03/2023   Lab Results  Component Value Date   ALT 16 05/03/2023   AST 31 05/03/2023   ALKPHOS 60 05/03/2023   BILITOT 1.4 (H) 05/03/2023   Type 2 diabetes mellitus with other specified complication, without long-term current use of insulin (HCC) Assessment & Plan: HgA1C at goal, went from 6.2 to 6.4 Continue Ozempic 1 mg weekly. Annual eye exam, periodic dental and foot care to continue. F/U in 5-6 months  Orders: -     POCT glycosylated hemoglobin (Hb A1C) -     Microalbumin / creatinine urine ratio; Future -     Comprehensive metabolic panel; Future  Essential hypertension, benign Assessment & Plan: BP adequately controlled. Continue Olmesartan 40 mg daily. Taking hydrochlorothiazide 25 mg every other day for LE edema. Eye exam is current.   Hx of PE (pulmonary thromboembolism) (HCC) Assessment & Plan: PE in 07/2019 and 07/2020. Continue Eliquis 5 mg twice daily.   Venous stasis dermatitis of both lower extremities Assessment & Plan: Problem has been stable. He has not using triamcinolone frequently. Hyperkeratosis on pretibial areas, he agrees with a trial of triamcinolone ointment 0.5% daily and ammonium lactate 12% daily for 2 weeks, he will continue as needed if he feels like treatment helps. Continue appropriate skin care and lower extremity elevation.  Orders: -     Ammonium Lactate; Apply 1 Application topically daily as needed for dry skin (on thick skin of distal legs.).  Dispense: 222 g; Refill: 0 -     Triamcinolone Acetonide; Apply 1 Application topically 2 (two) times daily as needed (stasis dermatitis.).  Dispense: 45 g; Refill: 1   He is not fasting today, so we will plan on fasting lipid  panel next visit. Right before leaving he mention that he is having right knee pain, exacerbated by certain activities like prolonged walking.  For now he is not interested in further work up or treatments.  Return in about 6 months (around 11/03/2023) for chronic problems.  I, Rolla Etienne Wierda, acting as a scribe for Corianna Avallone [place], MD., have documented all relevant documentation on the behalf of Hendel Gatliff [place], MD, as directed by  Braelon Sprung [place], MD while in the presence of Iolani Twilley [place], MD.   I, Jarvis Sawa [place], MD, have reviewed all documentation for this visit. The documentation on 05/03/23 for the exam, diagnosis, procedures, and orders are all accurate and complete.  Renald Haithcock G. [place], MD  [facility]. [place] office.

## 2023-05-03 NOTE — Assessment & Plan Note (Signed)
 Problem has been stable. He has not using triamcinolone frequently. Hyperkeratosis on pretibial areas, he agrees with a trial of triamcinolone ointment 0.5% daily and ammonium lactate 12% daily for 2 weeks, he will continue as needed if he feels like treatment helps. Continue appropriate skin care and lower extremity elevation.

## 2023-05-03 NOTE — Assessment & Plan Note (Signed)
 HgA1C at goal, went from 6.2 to 6.4 Continue Ozempic 1 mg weekly. Annual eye exam, periodic dental and foot care to continue. F/U in 5-6 months

## 2023-05-03 NOTE — Assessment & Plan Note (Signed)
 BP adequately controlled. Continue Olmesartan 40 mg daily. Taking hydrochlorothiazide 25 mg every other day for LE edema. Eye exam is current.

## 2023-05-03 NOTE — Assessment & Plan Note (Signed)
PE in 07/2019 and 07/2020. Continue Eliquis 5 mg twice daily.

## 2023-05-03 NOTE — Patient Instructions (Addendum)
 A few things to remember from today's visit:  Type 2 diabetes mellitus with other specified complication, without long-term current use of insulin (HCC) - Plan: POC HgB A1c, Microalbumin / creatinine urine ratio, Comprehensive metabolic panel  Essential hypertension, benign  Hx of PE (pulmonary thromboembolism) (HCC)  Venous stasis dermatitis of both lower extremities - Plan: triamcinolone ointment (KENALOG) 0.5 %  Let's try triamcinolone oint and Ammonium lactate lotion on thick skin of legs.  If you need refills for medications you take chronically, please call your pharmacy. Do not use My Chart to request refills or for acute issues that need immediate attention. If you send a my chart message, it may take a few days to be addressed, specially if I am not in the office.  Please be sure medication list is accurate. If a new problem present, please set up appointment sooner than planned today.

## 2023-05-04 ENCOUNTER — Encounter: Payer: Self-pay | Admitting: Family Medicine

## 2023-05-04 LAB — COMPREHENSIVE METABOLIC PANEL
ALT: 16 U/L (ref 0–53)
AST: 31 U/L (ref 0–37)
Albumin: 4.2 g/dL (ref 3.5–5.2)
Alkaline Phosphatase: 60 U/L (ref 39–117)
BUN: 10 mg/dL (ref 6–23)
CO2: 30 meq/L (ref 19–32)
Calcium: 9.4 mg/dL (ref 8.4–10.5)
Chloride: 96 meq/L (ref 96–112)
Creatinine, Ser: 0.84 mg/dL (ref 0.40–1.50)
GFR: 96.46 mL/min (ref >60.00)
Glucose, Bld: 138 mg/dL — ABNORMAL HIGH (ref 70–99)
Potassium: 4.4 meq/L (ref 3.5–5.1)
Sodium: 133 meq/L — ABNORMAL LOW (ref 135–145)
Total Bilirubin: 1.4 mg/dL — ABNORMAL HIGH (ref 0.2–1.2)
Total Protein: 7.9 g/dL (ref 6.0–8.3)

## 2023-05-31 ENCOUNTER — Other Ambulatory Visit: Payer: Self-pay | Admitting: Family Medicine

## 2023-05-31 DIAGNOSIS — I10 Essential (primary) hypertension: Secondary | ICD-10-CM

## 2023-05-31 DIAGNOSIS — I872 Venous insufficiency (chronic) (peripheral): Secondary | ICD-10-CM

## 2023-07-24 ENCOUNTER — Other Ambulatory Visit: Payer: Self-pay | Admitting: Family Medicine

## 2023-07-24 DIAGNOSIS — E1169 Type 2 diabetes mellitus with other specified complication: Secondary | ICD-10-CM

## 2023-08-27 ENCOUNTER — Other Ambulatory Visit: Payer: Self-pay | Admitting: Family Medicine

## 2023-08-27 DIAGNOSIS — I10 Essential (primary) hypertension: Secondary | ICD-10-CM

## 2023-09-18 ENCOUNTER — Encounter (HOSPITAL_BASED_OUTPATIENT_CLINIC_OR_DEPARTMENT_OTHER): Payer: Self-pay

## 2023-09-18 ENCOUNTER — Ambulatory Visit (INDEPENDENT_AMBULATORY_CARE_PROVIDER_SITE_OTHER): Admitting: Radiology

## 2023-09-18 ENCOUNTER — Other Ambulatory Visit (HOSPITAL_BASED_OUTPATIENT_CLINIC_OR_DEPARTMENT_OTHER): Payer: Self-pay

## 2023-09-18 ENCOUNTER — Ambulatory Visit (HOSPITAL_BASED_OUTPATIENT_CLINIC_OR_DEPARTMENT_OTHER)
Admission: RE | Admit: 2023-09-18 | Discharge: 2023-09-18 | Disposition: A | Discharge status: Home / Self Care | Source: Ambulatory Visit | Attending: Family Medicine | Admitting: Family Medicine

## 2023-09-18 VITALS — BP 130/78 | HR 55 | Temp 97.7°F | Resp 20

## 2023-09-18 DIAGNOSIS — R1013 Epigastric pain: Secondary | ICD-10-CM | POA: Diagnosis not present

## 2023-09-18 DIAGNOSIS — R319 Hematuria, unspecified: Secondary | ICD-10-CM | POA: Diagnosis not present

## 2023-09-18 DIAGNOSIS — R1012 Left upper quadrant pain: Secondary | ICD-10-CM | POA: Diagnosis not present

## 2023-09-18 DIAGNOSIS — R109 Unspecified abdominal pain: Secondary | ICD-10-CM

## 2023-09-18 DIAGNOSIS — K5900 Constipation, unspecified: Secondary | ICD-10-CM

## 2023-09-18 DIAGNOSIS — R1011 Right upper quadrant pain: Secondary | ICD-10-CM | POA: Diagnosis not present

## 2023-09-18 DIAGNOSIS — M545 Low back pain, unspecified: Secondary | ICD-10-CM

## 2023-09-18 LAB — POCT URINE DIPSTICK
Bilirubin, UA: NEGATIVE
Glucose, UA: NEGATIVE mg/dL
Ketones, POC UA: NEGATIVE mg/dL
Leukocytes, UA: NEGATIVE
Nitrite, UA: NEGATIVE
Protein Ur, POC: 30 mg/dL — AB
Spec Grav, UA: >=1.03 — AB (ref 1.010–1.025)
Urobilinogen, UA: 0.2 U/dL
pH, UA: 5.5 (ref 5.0–8.0)

## 2023-09-18 NOTE — ED Triage Notes (Addendum)
 Pt states that he has been dealing with constipation since Friday. Friday he had a normal BM but has not had one since then. This morning he felt like he needed to have a bm but was unable to get it all out. After attempting to have the bm he began to have lower back pain and upper quadrant abdominal pain. The pain is constant and dull. Pt took laxatives this morning around 8:30-9 with no relief. He was seen at [facility] this morning but they did not have imaging so he was advised to go to an ED. Pt thinks he may have a kidney stone due to the back pain. Pt denies seeing blood in his urine but on the UA from urgent care urine was positive for blood.

## 2023-09-18 NOTE — Discharge Instructions (Signed)
 Abdominal pain and constipation: Abdominal x-ray was negative for stones.  Showed a moderate stool burden.  Encouraged to get empty of stool.  Encouraged magnesium citrate to evacuate bowels.  Magnesium Citrate Bowel Cleanse  Night Before Bowel Cleanse:  Chill a Bottle of Magnesium Citrate overnight.  Day of the Bowel Cleanse:  At 7:00 am, drink half the bottle of Magnesium Citrate. At 7:00 am, drink 8-12 ounces of a clear liquid. At 8:00 am, drink 8-12 ounces of a clear liquid. At 9:00 am, drink 8-12 ounces of a clear liquid. At 10:00 am, drink 8-12 ounces of a clear liquid. At 11:00 am, drink 8-12 ounces of a clear liquid. At 12:00 pm, drink the other half the bottle of Magnesium Citrate. At 12:00 pm, drink 8-12 ounces of a clear liquid. At 1:00 pm, drink 8-12 ounces of a clear liquid. At 2:00 pm, drink 8-12 ounces of a clear liquid. At 3:00 pm, drink 8-12 ounces of a clear liquid. At 4:00 pm, drink 8-12 ounces of a clear liquid. At 5:00 pm, drink 8-12 ounces of a clear liquid.   Day after the Bowel Cleanse:  The next day, take Miralax , 1 capful mixed in 8 ounces of liquids, twice (morning and afternoon).  Drink lots of liquids all day long.  Follow up if symptoms do not improve, worsen or new symptoms occur.  If abdominal pain worsens go to an emergency room, may need a CT scan.

## 2023-09-18 NOTE — ED Provider Notes (Signed)
 [facility]    CSN: [id number] Arrival date & time: 09/18/23  1300      History   Chief Complaint Chief Complaint  Patient presents with   Constipation    HPI Earl Donovan is a 58 y.o. male.   58 year old male who reports he has been constipated since Friday, 09/15/2023.  He had a normal BM on Friday but has not had a bowel movement since that time.  He feels like he needs to have a BM but is not able to.  He started with abdominal pain on 09/16/23 and has developed lower back pain on 09/17/2023.  He has taken laxatives without any relief.  He went to [facility] urgent care this morning and they told him he had blood in his urine and might have a kidney stone.  Encouraged him to go to an emergency room.  He came here for further workup.  He has not previously had a kidney stone.   Constipation Associated symptoms: abdominal pain   Associated symptoms: no back pain, no diarrhea, no dysuria, no fever, no nausea and no vomiting     Past Medical History:  Diagnosis Date   Arthritis    Lower back pain   DM (diabetes mellitus) (HCC)    Gout    Hyperlipidemia    Hypertension    Morbid obesity with BMI of 50.0-59.9, adult (HCC)    Pulmonary embolism (HCC)    Sleep apnea treated with continuous positive airway pressure (CPAP)    Stasis dermatitis     Patient Active Problem List   Diagnosis Date Noted   Positive colorectal cancer screening using Cologuard test 09/05/2022   Hx of PE (pulmonary thromboembolism) (HCC) 07/27/2020   Bilateral pulmonary embolism (HCC) 07/20/2019   Type 2 diabetes mellitus with other specified complication (HCC) 02/03/2017   Hyperlipidemia associated with type 2 diabetes mellitus (HCC) 07/31/2015   Gout, arthropathy 07/31/2015   Essential hypertension, benign 07/31/2015   Venous stasis dermatitis of both lower extremities 07/31/2015   OSA on CPAP 07/31/2015   Spinal stenosis of lumbar region 07/23/2012   Numbness 07/23/2012   Back pain,  chronic 07/23/2012    Past Surgical History:  Procedure Laterality Date   COLONOSCOPY WITH PROPOFOL  N/A 09/05/2022   Procedure: COLONOSCOPY WITH PROPOFOL ;  Surgeon: Federico Rosario BROCKS, MD;  Location: [facility];  Service: Gastroenterology;  Laterality: N/A;   POLYPECTOMY  09/05/2022   Procedure: POLYPECTOMY;  Surgeon: Federico Rosario BROCKS, MD;  Location: [facility];  Service: Gastroenterology;;       Home Medications    Prior to Admission medications   Medication Sig Start Date End Date Taking? Authorizing Provider  allopurinol  (ZYLOPRIM ) 300 MG tablet TAKE 1 TABLET BY MOUTH DAILY 02/20/23   [place], Betty G, MD  ammonium lactate  (LAC-HYDRIN ) 12 % lotion Apply 1 Application topically daily as needed for dry skin (on thick skin of distal legs.). 05/03/23   [place], Betty G, MD  atorvastatin  (LIPITOR) 20 MG tablet Take 1 tablet (20 mg total) by mouth daily. Patient taking differently: Take 20 mg by mouth every evening. 10/25/21   [place], Betty G, MD  ELIQUIS  5 MG TABS tablet TAKE 1 TABLET(5 MG) BY MOUTH TWICE DAILY 03/17/23   [place], Betty G, MD  hydrochlorothiazide  (HYDRODIURIL ) 25 MG tablet TAKE 1 TABLET(25 MG) BY MOUTH DAILY 05/31/23   [place], Betty G, MD  Na Sulfate-K Sulfate-Mg Sulf 17.5-3.13-1.6 GM/177ML SOLN Take 354 mLs by mouth once. 05/11/22   [provider]  olmesartan  (BENICAR ) 40 MG tablet TAKE 1 TABLET(40 MG) BY MOUTH DAILY 08/28/23   [place], Betty G, MD  OZEMPIC , 1 MG/DOSE, 4 MG/3ML SOPN INJECT 1 MG UNDER THE SKIN ONCE A WEEK AS DIRECTED 07/25/23   [place], Betty G, MD  triamcinolone  ointment (KENALOG ) 0.5 % Apply 1 Application topically 2 (two) times daily as needed (stasis dermatitis.). 05/03/23   [place], Betty G, MD    Family History Family History  Problem Relation Age of Onset   Arthritis Mother    Breast cancer Mother    Melanoma Father    Anxiety disorder Brother    Hyperlipidemia Brother    Asthma Maternal Grandmother    Asthma Maternal Grandfather    Dementia  Maternal Grandfather    Cancer Paternal Grandmother        type unknown   Clotting disorder Neg Hx     Social History Social History   Tobacco Use   Smoking status: Never   Smokeless tobacco: Never  Vaping Use   Vaping status: Never Used  Substance Use Topics   Alcohol use: Yes    Comment: occasional   Drug use: No     Allergies   Patient has no known allergies.   Review of Systems Review of Systems  Constitutional:  Negative for chills and fever.  HENT:  Negative for ear pain and sore throat.   Eyes:  Negative for pain and visual disturbance.  Respiratory:  Negative for cough.   Cardiovascular:  Negative for chest pain and palpitations.  Gastrointestinal:  Positive for abdominal pain and constipation. Negative for diarrhea, nausea and vomiting.  Genitourinary:  Negative for dysuria and hematuria.  Musculoskeletal:  Negative for arthralgias and back pain.  Skin:  Negative for color change and rash.  Neurological:  Negative for seizures and syncope.  All other systems reviewed and are negative.    Physical Exam Triage Vital Signs ED Triage Vitals  Encounter Vitals Group     BP 09/18/23 1327 130/78     Girls Systolic BP Percentile --      Girls Diastolic BP Percentile --      Boys Systolic BP Percentile --      Boys Diastolic BP Percentile --      Pulse Rate 09/18/23 1327 (!) 55     Resp 09/18/23 1327 20     Temp 09/18/23 1327 97.7 F (36.5 C)     Temp Source 09/18/23 1327 Oral     SpO2 09/18/23 1327 98 %     Weight --      Height --      Head Circumference --      Peak Flow --      Pain Score 09/18/23 1326 6     Pain Loc --      Pain Education --      Exclude from Growth Chart --    No data found.  Updated Vital Signs BP 130/78 (BP Location: Right Arm)   Pulse (!) 55   Temp 97.7 F (36.5 C) (Oral)   Resp 20   SpO2 98%   Visual Acuity Right Eye Distance:   Left Eye Distance:   Bilateral Distance:    Right Eye Near:   Left Eye Near:     Bilateral Near:     Physical Exam Vitals and nursing note reviewed.  Constitutional:      General: He is not in acute distress.    Appearance: He is well-developed. He is not ill-appearing or  toxic-appearing.  HENT:     Head: Normocephalic and atraumatic.     Right Ear: Hearing, tympanic membrane, ear canal and external ear normal.     Left Ear: Hearing, tympanic membrane, ear canal and external ear normal.     Nose: No congestion or rhinorrhea.     Right Sinus: No maxillary sinus tenderness or frontal sinus tenderness.     Left Sinus: No maxillary sinus tenderness or frontal sinus tenderness.     Mouth/Throat:     Lips: Pink.     Mouth: Mucous membranes are moist.     Pharynx: Uvula midline. No oropharyngeal exudate or posterior oropharyngeal erythema.     Tonsils: No tonsillar exudate.  Eyes:     Conjunctiva/sclera: Conjunctivae normal.     Pupils: Pupils are equal, round, and reactive to light.  Cardiovascular:     Rate and Rhythm: Normal rate and regular rhythm.     Heart sounds: S1 normal and S2 normal. No murmur heard. Pulmonary:     Effort: Pulmonary effort is normal. No respiratory distress.     Breath sounds: Normal breath sounds. No decreased breath sounds, wheezing, rhonchi or rales.  Abdominal:     General: Bowel sounds are normal.     Palpations: Abdomen is soft.     Tenderness: There is abdominal tenderness in the right upper quadrant, epigastric area and left upper quadrant. There is no right CVA tenderness, left CVA tenderness, guarding or rebound. Negative signs include Murphy's sign, Rovsing's sign and McBurney's sign.  Musculoskeletal:        General: No swelling.     Cervical back: Neck supple.     Lumbar back: Tenderness and bony tenderness present. No swelling, edema, deformity, signs of trauma, lacerations or spasms. Normal range of motion. No scoliosis.     Comments: Central midline lower back pain without radiculopathy to the legs.  Lymphadenopathy:      Head:     Right side of head: No submental, submandibular, tonsillar, preauricular or posterior auricular adenopathy.     Left side of head: No submental, submandibular, tonsillar, preauricular or posterior auricular adenopathy.     Cervical: No cervical adenopathy.     Right cervical: No superficial cervical adenopathy.    Left cervical: No superficial cervical adenopathy.  Skin:    General: Skin is warm and dry.     Capillary Refill: Capillary refill takes less than 2 seconds.     Findings: No rash.  Neurological:     Mental Status: He is alert and oriented to person, place, and time.  Psychiatric:        Mood and Affect: Mood normal.      UC Treatments / Results  Labs (all labs ordered are listed, but only abnormal results are displayed) Labs Reviewed  POCT URINE DIPSTICK - Abnormal; Notable for the following components:      Result Value   Spec Grav, UA >=1.030 (*)    Blood, UA large (*)    Protein Ur, POC =30 (*)    All other components within normal limits  URINE CULTURE    EKG   Radiology DG Abd 1 View Result Date: 09/18/2023 CLINICAL DATA:  abdominal pain EXAM: ABDOMEN - 1 VIEW COMPARISON:  None Available. FINDINGS: Nonobstructive bowel gas pattern. Moderate volume fecal loading in the right colon. No pneumoperitoneum. No organomegaly or radiopaque calculi. No acute fracture or destructive lesion. The lung bases are clear. IMPRESSION: Nonobstructive bowel gas pattern. Moderate volume fecal loading in  the right colon, as can be seen in constipation. Electronically Signed   By: Rogelia Myers M.D.   On: 09/18/2023 14:39    Procedures Procedures (including critical care time)  Medications Ordered in UC Medications - No data to display  Initial Impression / Assessment and Plan / UC Course  I have reviewed the triage vital signs and the nursing notes.  Pertinent labs & imaging results that were available during my care of the patient were reviewed by me and  considered in my medical decision making (see chart for details).  Plan of Care: Abdominal pain and lower back pain with hematuria: Urinalysis is abnormal but no does not show UTI.  Urine C&S sent.  Will adjust the plan of care, if needed once the culture results.  KUB shows large stool burden but is otherwise negative for stones.  Encouraged to get empty of stool.  See discharge instructions for specific instructions.  If symptoms persist or do not resolve, encouraged to return here or go to an emergency room for CT scan.  I reviewed the plan of care with the patient and/or the patient's guardian.  The patient and/or guardian had time to ask questions and acknowledged that the questions were answered.  I provided instruction on symptoms or reasons to return here or to go to an ER, if symptoms/condition did not improve, worsened or if new symptoms occurred.  Final Clinical Impressions(s) / UC Diagnoses   Final diagnoses:  Hematuria, unspecified type  Left upper quadrant abdominal pain  Right upper quadrant abdominal pain  Abdominal pain, epigastric  Acute midline low back pain without sciatica  Constipation, unspecified constipation type     Discharge Instructions      Abdominal pain and constipation: Abdominal x-ray was negative for stones.  Showed a moderate stool burden.  Encouraged to get empty of stool.  Encouraged magnesium citrate to evacuate bowels.  Magnesium Citrate Bowel Cleanse  Night Before Bowel Cleanse:  Chill a Bottle of Magnesium Citrate overnight.  Day of the Bowel Cleanse:  At 7:00 am, drink half the bottle of Magnesium Citrate. At 7:00 am, drink 8-12 ounces of a clear liquid. At 8:00 am, drink 8-12 ounces of a clear liquid. At 9:00 am, drink 8-12 ounces of a clear liquid. At 10:00 am, drink 8-12 ounces of a clear liquid. At 11:00 am, drink 8-12 ounces of a clear liquid. At 12:00 pm, drink the other half the bottle of Magnesium Citrate. At 12:00 pm, drink 8-12  ounces of a clear liquid. At 1:00 pm, drink 8-12 ounces of a clear liquid. At 2:00 pm, drink 8-12 ounces of a clear liquid. At 3:00 pm, drink 8-12 ounces of a clear liquid. At 4:00 pm, drink 8-12 ounces of a clear liquid. At 5:00 pm, drink 8-12 ounces of a clear liquid.   Day after the Bowel Cleanse:  The next day, take Miralax , 1 capful mixed in 8 ounces of liquids, twice (morning and afternoon).  Drink lots of liquids all day long.  Follow up if symptoms do not improve, worsen or new symptoms occur.  If abdominal pain worsens go to an emergency room, may need a CT scan.     ED Prescriptions   None    PDMP not reviewed this encounter.   Ival Domino, FNP 09/18/23 [phone]

## 2023-10-08 ENCOUNTER — Emergency Department (HOSPITAL_COMMUNITY)
Admission: EM | Admit: 2023-10-08 | Discharge: 2023-10-08 | Disposition: A | Discharge status: Home / Self Care | Attending: Emergency Medicine | Admitting: Emergency Medicine

## 2023-10-08 ENCOUNTER — Emergency Department (HOSPITAL_COMMUNITY)

## 2023-10-08 ENCOUNTER — Other Ambulatory Visit: Payer: Self-pay

## 2023-10-08 ENCOUNTER — Encounter (HOSPITAL_COMMUNITY): Payer: Self-pay

## 2023-10-08 DIAGNOSIS — K529 Noninfective gastroenteritis and colitis, unspecified: Secondary | ICD-10-CM | POA: Diagnosis not present

## 2023-10-08 DIAGNOSIS — S81801A Unspecified open wound, right lower leg, initial encounter: Secondary | ICD-10-CM | POA: Insufficient documentation

## 2023-10-08 DIAGNOSIS — E119 Type 2 diabetes mellitus without complications: Secondary | ICD-10-CM | POA: Insufficient documentation

## 2023-10-08 DIAGNOSIS — Z7901 Long term (current) use of anticoagulants: Secondary | ICD-10-CM | POA: Diagnosis not present

## 2023-10-08 DIAGNOSIS — E871 Hypo-osmolality and hyponatremia: Secondary | ICD-10-CM | POA: Diagnosis not present

## 2023-10-08 DIAGNOSIS — I872 Venous insufficiency (chronic) (peripheral): Secondary | ICD-10-CM | POA: Diagnosis not present

## 2023-10-08 DIAGNOSIS — R509 Fever, unspecified: Secondary | ICD-10-CM | POA: Diagnosis present

## 2023-10-08 DIAGNOSIS — X58XXXA Exposure to other specified factors, initial encounter: Secondary | ICD-10-CM | POA: Insufficient documentation

## 2023-10-08 DIAGNOSIS — K7689 Other specified diseases of liver: Secondary | ICD-10-CM

## 2023-10-08 LAB — COMPREHENSIVE METABOLIC PANEL WITH GFR
ALT: 14 U/L (ref 0–44)
AST: 24 U/L (ref 15–41)
Albumin: 3.4 g/dL — ABNORMAL LOW (ref 3.5–5.0)
Alkaline Phosphatase: 55 U/L (ref 38–126)
Anion gap: 10 (ref 5–15)
BUN: 23 mg/dL — ABNORMAL HIGH (ref 6–20)
CO2: 19 mmol/L — ABNORMAL LOW (ref 22–32)
Calcium: 8.7 mg/dL — ABNORMAL LOW (ref 8.9–10.3)
Chloride: 102 mmol/L (ref 98–111)
Creatinine, Ser: 1.09 mg/dL (ref 0.61–1.24)
GFR, Estimated: >60 mL/min (ref >60)
Glucose, Bld: 169 mg/dL — ABNORMAL HIGH (ref 70–99)
Potassium: 4.4 mmol/L (ref 3.5–5.1)
Sodium: 131 mmol/L — ABNORMAL LOW (ref 135–145)
Total Bilirubin: 1.9 mg/dL — ABNORMAL HIGH (ref 0.0–1.2)
Total Protein: 7.9 g/dL (ref 6.5–8.1)

## 2023-10-08 LAB — URINALYSIS, ROUTINE W REFLEX MICROSCOPIC
Bilirubin Urine: NEGATIVE
Glucose, UA: NEGATIVE mg/dL
Hgb urine dipstick: NEGATIVE
Ketones, ur: NEGATIVE mg/dL
Leukocytes,Ua: NEGATIVE
Nitrite: NEGATIVE
Protein, ur: NEGATIVE mg/dL
Specific Gravity, Urine: 1.024 (ref 1.005–1.030)
pH: 5 (ref 5.0–8.0)

## 2023-10-08 LAB — CBC
HCT: 46.9 % (ref 39.0–52.0)
Hemoglobin: 16 g/dL (ref 13.0–17.0)
MCH: 30.2 pg (ref 26.0–34.0)
MCHC: 34.1 g/dL (ref 30.0–36.0)
MCV: 88.7 fL (ref 80.0–100.0)
Platelets: 277 K/uL (ref 150–400)
RBC: 5.29 MIL/uL (ref 4.22–5.81)
RDW: 13.3 % (ref 11.5–15.5)
WBC: 11.1 K/uL — ABNORMAL HIGH (ref 4.0–10.5)
nRBC: 0 % (ref 0.0–0.2)

## 2023-10-08 LAB — LIPASE, BLOOD: Lipase: 31 U/L (ref 11–51)

## 2023-10-08 MED ORDER — MEDIHONEY WOUND/BURN DRESSING EX GEL: 1.0000 | Freq: Every day | CUTANEOUS | 3 refills | Status: AC

## 2023-10-08 MED ORDER — IOHEXOL 350 MG/ML SOLN
100.0000 mL | Freq: Once | INTRAVENOUS | Status: AC | PRN
Start: 2023-10-08 — End: 2023-10-08
  Administered 2023-10-08: 100 mL via INTRAVENOUS

## 2023-10-08 MED ORDER — SODIUM CHLORIDE 0.9 % IV BOLUS
1000.0000 mL | Freq: Once | INTRAVENOUS | Status: AC
  Administered 2023-10-08: 1000 mL via INTRAVENOUS

## 2023-10-08 MED ORDER — ONDANSETRON HCL 4 MG/2ML IJ SOLN
4.0000 mg | Freq: Once | INTRAMUSCULAR | Status: AC
  Administered 2023-10-08: 4 mg via INTRAVENOUS
  Filled 2023-10-08: qty 2

## 2023-10-08 MED ORDER — ONDANSETRON 4 MG PO TBDP: 4.0000 mg | ORAL_TABLET | Freq: Three times a day (TID) | ORAL | 0 refills | Status: AC | PRN

## 2023-10-08 MED ORDER — ONDANSETRON 4 MG PO TBDP
4.0000 mg | ORAL_TABLET | Freq: Once | ORAL | Status: AC | PRN
  Administered 2023-10-08: 4 mg via ORAL
  Filled 2023-10-08: qty 1

## 2023-10-08 NOTE — Discharge Instructions (Addendum)
 I recommend that you stick to a liquid and soft food diet for the next 2 days.  Drink plenty of water or low calorie Gatorade, consider adding a banana or 2 daily to supplement your potassium.  Try to avoid greasy food, fried food, bread, creamy food, or food that will make you gassy.   Sometimes these type of viral illnesses that cause gastroenteritis can lead to severe constipation.  In some cases this is called an ileus or obstruction, which can cause very severe abdominal pain and persistent vomiting.  If this is the case you need to return to the hospital.  Please note that your CT scan showed that you have several enlarged lymph nodes in your abdomen and pelvis.  These may be a reaction to the virus.  However these may be a sign of another medical condition, such as cancer.  It is recommended that your doctor order a repeat CT scan of your abdomen and pelvis with contrast of the next 3 to 6 months.  Reach out to your doctor's office for this

## 2023-10-08 NOTE — ED Triage Notes (Signed)
 Pt c.o generalized abd pain, fever and vomiting since Friday. Last BM was last night.

## 2023-10-08 NOTE — ED Provider Notes (Signed)
 [place] EMERGENCY DEPARTMENT AT [facility] Provider Note   CSN: [id number] Arrival date & time: 10/08/23  1012     Patient presents with: Abdominal Pain, Fever, and Emesis   Earl Donovan is a 58 y.o. male w/ hx of diabetes, stasis dermatitis, obesity, here with nausea/vomiting and diarrhea.  Patient went to UC on 09/18/23 with constipation at that time, had xray showing high stool burden, and had a bowel cleanout.  He reports 2 days ago he started having nausea, vomiting and nonbloody diarrhea, subjective fevers, and abdominal pain, mostly LLQ.  Denies hx of abdominal surgery or known diverticulitis    HPI     Prior to Admission medications   Medication Sig Start Date End Date Taking? Authorizing Provider  leptospermum manuka honey (MEDIHONEY) gel Apply 1 Application topically daily. Apply to ulceration on right lower leg 10/08/23  Yes Treniya Lobb, Donnice PARAS, MD  ondansetron  (ZOFRAN -ODT) 4 MG disintegrating tablet Take 1 tablet (4 mg total) by mouth every 8 (eight) hours as needed for up to 15 doses for nausea or vomiting. 10/08/23  Yes Cottie Donnice PARAS, MD  allopurinol  (ZYLOPRIM ) 300 MG tablet TAKE 1 TABLET BY MOUTH DAILY 02/20/23   [place], Betty G, MD  ammonium lactate  (LAC-HYDRIN ) 12 % lotion Apply 1 Application topically daily as needed for dry skin (on thick skin of distal legs.). 05/03/23   [place], Betty G, MD  atorvastatin  (LIPITOR) 20 MG tablet Take 1 tablet (20 mg total) by mouth daily. Patient taking differently: Take 20 mg by mouth every evening. 10/25/21   [place], Betty G, MD  ELIQUIS  5 MG TABS tablet TAKE 1 TABLET(5 MG) BY MOUTH TWICE DAILY 03/17/23   [place], Betty G, MD  hydrochlorothiazide  (HYDRODIURIL ) 25 MG tablet TAKE 1 TABLET(25 MG) BY MOUTH DAILY 05/31/23   [place], Betty G, MD  Na Sulfate-K Sulfate-Mg Sulf 17.5-3.13-1.6 GM/177ML SOLN Take 354 mLs by mouth once. 05/11/22   [provider]  olmesartan  (BENICAR ) 40 MG tablet TAKE 1 TABLET(40 MG) BY MOUTH  DAILY 08/28/23   [place], Betty G, MD  OZEMPIC , 1 MG/DOSE, 4 MG/3ML SOPN INJECT 1 MG UNDER THE SKIN ONCE A WEEK AS DIRECTED 07/25/23   [place], Betty G, MD  triamcinolone  ointment (KENALOG ) 0.5 % Apply 1 Application topically 2 (two) times daily as needed (stasis dermatitis.). 05/03/23   [place], Betty G, MD    Allergies: Patient has no known allergies.    Review of Systems  Updated Vital Signs BP (!) 127/58   Pulse 67   Temp 97.6 F (36.4 C) (Oral)   Resp 18   Ht 6' (1.829 m)   Wt (!) 181.4 kg   SpO2 100%   BMI 54.25 kg/m   Physical Exam Constitutional:      General: He is not in acute distress.    Appearance: He is obese.  HENT:     Head: Normocephalic and atraumatic.  Eyes:     Conjunctiva/sclera: Conjunctivae normal.     Pupils: Pupils are equal, round, and reactive to light.  Cardiovascular:     Rate and Rhythm: Normal rate and regular rhythm.  Pulmonary:     Effort: Pulmonary effort is normal. No respiratory distress.  Abdominal:     General: There is no distension.     Tenderness: There is generalized abdominal tenderness. There is no guarding or rebound.  Skin:    General: Skin is warm and dry.     Comments: Stasis dermatitis of bilaterlal lower legs with +4  pitting edema, 3 cm circular pressure wound on lateral aspect of right calf, weeping clear fluid, no purulence  Neurological:     General: No focal deficit present.     Mental Status: He is alert. Mental status is at baseline.  Psychiatric:        Mood and Affect: Mood normal.        Behavior: Behavior normal.     (all labs ordered are listed, but only abnormal results are displayed) Labs Reviewed  COMPREHENSIVE METABOLIC PANEL WITH GFR - Abnormal; Notable for the following components:      Result Value   Sodium 131 (*)    CO2 19 (*)    Glucose, Bld 169 (*)    BUN 23 (*)    Calcium  8.7 (*)    Albumin 3.4 (*)    Total Bilirubin 1.9 (*)    All other components within normal limits  CBC - Abnormal;  Notable for the following components:   WBC 11.1 (*)    All other components within normal limits  URINALYSIS, ROUTINE W REFLEX MICROSCOPIC - Abnormal; Notable for the following components:   Color, Urine AMBER (*)    All other components within normal limits  LIPASE, BLOOD    EKG: None  Radiology: CT ABDOMEN PELVIS W CONTRAST Result Date: 10/08/2023 CLINICAL DATA:  Generalized abdominal pain with fever and vomiting. EXAM: CT ABDOMEN AND PELVIS WITH CONTRAST TECHNIQUE: Multidetector CT imaging of the abdomen and pelvis was performed using the standard protocol following bolus administration of intravenous contrast. RADIATION DOSE REDUCTION: This exam was performed according to the departmental dose-optimization program which includes automated exposure control, adjustment of the mA and/or kV according to patient size and/or use of iterative reconstruction technique. CONTRAST:  OMNIPAQUE  IOHEXOL  350 MG/ML SOLN COMPARISON:  None Available. FINDINGS: Lower chest: No acute findings. Hepatobiliary: No suspicious focal abnormality within the liver parenchyma. Subtle nodularity of liver contour raises the question of cirrhosis. See coronal image 89 of series 7). There is no evidence for gallstones, gallbladder wall thickening, or pericholecystic fluid. No intrahepatic or extrahepatic biliary dilation. Pancreas: No focal mass lesion. No dilatation of the main duct. No intraparenchymal cyst. No peripancreatic edema. Spleen: No splenomegaly. No suspicious focal mass lesion. Adrenals/Urinary Tract: No adrenal nodule or mass. Right kidney unremarkable adjacent tiny 1 mm nonobstructing stones are seen in the upper pole left kidney (coronal image 62/7). No evidence for hydroureter. The urinary bladder appears normal for the degree of distention. Stomach/Bowel: Stomach is unremarkable. No gastric wall thickening. No evidence of outlet obstruction. Duodenum is normally positioned as is the ligament of Treitz.  Proximal small bowel loops are mildly distended up to 3.1 cm diameter gradual transition to decompressed small bowel loops in the right lower quadrant without a distinct or abrupt transition zone. Terminal ileum is decompressed. The appendix is not well visualized, but there is no edema or inflammation in the region of the cecal tip to suggest appendicitis. No gross colonic mass. No colonic wall thickening. Vascular/Lymphatic: There is mild atherosclerotic calcification of the abdominal aorta without aneurysm. There is no gastrohepatic or hepatoduodenal ligament lymphadenopathy. No retroperitoneal or mesenteric lymphadenopathy. Mildly enlarged right external iliac nodes measure up to 14 mm short axis (image 90/3). No groin lymphadenopathy. Reproductive: The prostate gland and seminal vesicles are unremarkable. Other: No intraperitoneal free fluid. Musculoskeletal: No worrisome lytic or sclerotic osseous abnormality. IMPRESSION: 1. Proximal small bowel loops are mildly distended up to 3.1 cm diameter with gradual transition to decompressed  small bowel loops in the right lower quadrant. No distinct or abrupt transition zone. Imaging features may reflect an underlying component of ileus. No small bowel wall thickening or perienteric/mesenteric edema. 2. Subtle nodularity of liver contour raises the question of cirrhosis. 3. Tiny 1 mm nonobstructing stones in the upper pole left kidney. 4. Mildly enlarged right external iliac nodes measuring up to 14 mm short axis. These are nonspecific and may be reactive. Consider follow-up CT pelvis in 3 months to ensure stability as metastatic disease or lymphoproliferative process could have a similar appearance. 5.  Aortic Atherosclerosis (ICD10-I70.0). Electronically Signed   By: Camellia Candle M.D.   On: 10/08/2023 13:45     Procedures   Medications Ordered in the ED  ondansetron  (ZOFRAN -ODT) disintegrating tablet 4 mg (4 mg Oral Given 10/08/23 1022)  ondansetron  (ZOFRAN )  injection 4 mg (4 mg Intravenous Given 10/08/23 1148)  sodium chloride  0.9 % bolus 1,000 mL (0 mLs Intravenous Stopped 10/08/23 1317)  iohexol  (OMNIPAQUE ) 350 MG/ML injection 100 mL (100 mLs Intravenous Contrast Given 10/08/23 1258)    Clinical Course as of 10/08/23 1446  Sun Oct 08, 2023  1445 Patient is feeling much better after his IV fluids and Zofran .  He was able to tolerate p.o.  We discussed his CT findings including incidental findings of enlarged lymph nodes, need for repeat surveillance screening in 3 to 6 months.  He verbalized understanding.  He is comfortable with discharge and I will prescribe him Zofran  as needed at home, and encouraged staying hydrated.  I suspect this is likely viral illness [MT]    Clinical Course User Index [MT] Coy Vandoren, Donnice PARAS, MD                                 Medical Decision Making Amount and/or Complexity of Data Reviewed Labs: ordered. Radiology: ordered.  Risk Prescription drug management.   This patient presents to the ED with concern for abd pain, n/v/diahrrea. This involves an extensive number of treatment options, and is a complaint that carries with it a high risk of complications and morbidity.  The differential diagnosis includes viral gastroenteritis vs pancreatitis vs biliary disease vs diverticulitis vs other  Co-morbidities that complicate the patient evaluation: diabetes  External records from outside source obtained and reviewed including UC evaluation and xray abd 09/18/23  I ordered and personally interpreted labs.  The pertinent results include: UA without sign of infection.  No acute anemia.  Lipase and LFTs largely unremarkable.  No AKI.  Mild hyponatremia sodium 131.  White blood cell count 11.1, likely mildly reactive from vomiting  I ordered imaging studies including ct abdomen pelvis I independently visualized and interpreted imaging which showed some mild bowel dilatation without clear evidence of bowel obstruction.   Lymphadenopathy noted in the abdomen and pelvis. I agree with the radiologist interpretation  I ordered medication including IV fluids and nausea medications  I have reviewed the patients home medicines and have made adjustments as needed  Test Considered: doubt AAA, testicular pathology, mesenteric ischemia  After the interventions noted above, I reevaluated the patient and found that they have: improved  Weeping edema and venous stasis dermatitis lower extremity.  Doubt cellulitis or infection.  He already uses lasix and compression stockings at home.  We'll dress the wound, try to keep it dry.  I advised med honey may help with healing, but mostly this is poor healing due to diabetes  and his degree of edema.  Needs PCP follow up and wound care in the future for this; he is aware.  Dispostion:  After consideration of the diagnostic results and the patients response to treatment, I feel that the patent would benefit from close outpatient follow-up      Final diagnoses:  Gastroenteritis  Hyponatremia  Wound of right lower extremity, initial encounter  Venous stasis dermatitis    ED Discharge Orders          Ordered    leptospermum manuka honey (MEDIHONEY) gel  Daily        10/08/23 1443    ondansetron  (ZOFRAN -ODT) 4 MG disintegrating tablet  Every 8 hours PRN        10/08/23 1445               Cottie Donnice PARAS, MD 10/08/23 1446

## 2023-10-11 ENCOUNTER — Ambulatory Visit: Admitting: Family Medicine

## 2023-10-11 VITALS — BP 102/70 | HR 85 | Temp 97.8°F | Resp 16 | Ht 72.0 in | Wt 398.0 lb

## 2023-10-11 DIAGNOSIS — E785 Hyperlipidemia, unspecified: Secondary | ICD-10-CM

## 2023-10-11 DIAGNOSIS — I872 Venous insufficiency (chronic) (peripheral): Secondary | ICD-10-CM

## 2023-10-11 DIAGNOSIS — D6859 Other primary thrombophilia: Secondary | ICD-10-CM | POA: Diagnosis not present

## 2023-10-11 DIAGNOSIS — E1169 Type 2 diabetes mellitus with other specified complication: Secondary | ICD-10-CM | POA: Diagnosis not present

## 2023-10-11 DIAGNOSIS — I10 Essential (primary) hypertension: Secondary | ICD-10-CM

## 2023-10-11 LAB — POCT GLYCOSYLATED HEMOGLOBIN (HGB A1C): Hemoglobin A1C: 6.8 % — AB (ref 4.0–5.6)

## 2023-10-11 NOTE — Patient Instructions (Addendum)
 A few things to remember from today's visit:  Type 2 diabetes mellitus with other specified complication, without long-term current use of insulin  (HCC) - Plan: POC HgB A1c  Essential hypertension, benign  Venous stasis dermatitis of both lower extremities  Hyperlipidemia associated with type 2 diabetes mellitus (HCC)  No changes today. Fasting labs next visit.  If you need refills for medications you take chronically, please call your pharmacy. Do not use My Chart to request refills or for acute issues that need immediate attention. If you send a my chart message, it may take a few days to be addressed, specially if I am not in the office.  Please be sure medication list is accurate. If a new problem present, please set up appointment sooner than planned today.

## 2023-10-11 NOTE — Progress Notes (Signed)
 HPI:  Chief Complaint  Patient presents with   Medical Management of Chronic Issues    Earl Donovan is a 58 y.o. male, with a PMHx significant for HTN, PE, HLD, and DM II, who is here today for chronic disease management.  Last seen on 05/03/2023, in the interim he was seen at an [facility] 09/18/2023 and in the ED on 10/08/2023.   Hypertension:  Medications: Olmesartan  40 mg daily and for edema he takes HCTZ 25 mg daily. Side effects: none mentioned.  Negative for unusual or severe headache, visual changes, exertional chest pain, dyspnea, palpitations, or focal weakness. BP Readings from Last 3 Encounters:  10/11/23 102/70  10/08/23 (!) 142/63  09/18/23 130/78   Lab Results  Component Value Date   CREATININE 1.09 10/08/2023   BUN 23 (H) 10/08/2023   NA 131 (L) 10/08/2023   K 4.4 10/08/2023   CL 102 10/08/2023   CO2 19 (L) 10/08/2023    Diabetes Mellitus II:  - Checking BG at home: no - Medications: Ozempic  1 mg injected weekly. - Diet: improved some  - Exercise: walking 15 minutes  - eye exam: 08/2022 - foot exam: 04/2023  - Negative for symptoms of hypoglycemia, polyuria, polydipsia, numbness extremities Positive for LE ulcers due to Chronic Stasis Dermatitis, which is managed with MediHoney daily and Kenalog  twice daily as needed. Lab Results  Component Value Date   HGBA1C 6.8 (A) 10/11/2023   HGBA1C 6.4 05/03/2023   Lab Results  Component Value Date   MICROALBUR 1.5 05/03/2023    Hyperlipidemia: Currently on Atorvastatin  20 mg daily Side effects from medication: none mentioned Lab Results  Component Value Date   CHOL 120 05/02/2022   HDL 39.20 05/02/2022   LDLCALC 67 05/02/2022   TRIG 69.0 05/02/2022   CHOLHDL 3 05/02/2022   Gout managed with Allopurinol  300 mg daily.  Hx of PE managed on Eliquis  5 mg daily.   Gastroenteritis Seen 10/08/2023 in ED with complaints of N/V/D. Earlier in the month seen at [facility] for abdominal pain. Abdomen X-ray  09/18/2023 showed nonobstructive bowel gas pattern, as well as moderate fecal volume in the right colon, consistent with constipation.  CT Abdomen 10/08/2023:  Proximal small bowel loops are mildly distended up to 3.1 cm diameter with gradual transition to decompressed small bowel loops in the right lower quadrant. No distinct or abrupt transition zone. Imaging features may reflect an underlying component of ileus. No small bowel wall thickening or perienteric/mesenteric edema. Subtle nodularity of liver contour raises the question of cirrhosis. Tiny 1 mm nonobstructing stones in the upper pole left kidney. Mildly enlarged right external iliac nodes measuring up to 14 mm short axis. These are nonspecific and may be reactive.   Aortic Atherosclerosis  Today he says that his last good bowel movement was Sunday. Denies fever/chills or nausea/vomiting/diarrhea. Due to his symptoms, he was unable to eat much and subsequently between March and today he has lost 12 pounds and today weighs 398 LBS.  Review of Systems  Constitutional:  Negative for activity change, appetite change, fatigue, fever and unexpected weight change.  HENT:  Negative for nosebleeds, sore throat and trouble swallowing.   Eyes:  Negative for redness and visual disturbance.  Respiratory:  Negative for apnea, cough, shortness of breath and wheezing.   Cardiovascular:  Negative for chest pain, palpitations and leg swelling.  Gastrointestinal:  Negative for abdominal pain, nausea and vomiting.  Genitourinary:  Negative for decreased urine volume, dysuria and hematuria.  Skin:  Positive for wound (chronic stasis dermatitis).  Neurological:  Negative for dizziness, seizures, weakness, numbness and headaches.  Psychiatric/Behavioral: Negative.     See other pertinent positives and negatives in HPI.  Current Outpatient Medications on File Prior to Visit  Medication Sig Dispense Refill   allopurinol  (ZYLOPRIM ) 300 MG tablet TAKE 1  TABLET BY MOUTH DAILY 90 tablet 3   ammonium lactate  (LAC-HYDRIN ) 12 % lotion Apply 1 Application topically daily as needed for dry skin (on thick skin of distal legs.). 222 g 0   atorvastatin  (LIPITOR) 20 MG tablet Take 1 tablet (20 mg total) by mouth daily. (Patient taking differently: Take 20 mg by mouth every evening.) 90 tablet 3   ELIQUIS  5 MG TABS tablet TAKE 1 TABLET(5 MG) BY MOUTH TWICE DAILY 180 tablet 3   hydrochlorothiazide  (HYDRODIURIL ) 25 MG tablet TAKE 1 TABLET(25 MG) BY MOUTH DAILY 90 tablet 2   leptospermum manuka honey (MEDIHONEY) gel Apply 1 Application topically daily. Apply to ulceration on right lower leg 44 mL 3   Na Sulfate-K Sulfate-Mg Sulf 17.5-3.13-1.6 GM/177ML SOLN Take 354 mLs by mouth once.     olmesartan  (BENICAR ) 40 MG tablet TAKE 1 TABLET(40 MG) BY MOUTH DAILY 90 tablet 2   ondansetron  (ZOFRAN -ODT) 4 MG disintegrating tablet Take 1 tablet (4 mg total) by mouth every 8 (eight) hours as needed for up to 15 doses for nausea or vomiting. 15 tablet 0   OZEMPIC , 1 MG/DOSE, 4 MG/3ML SOPN INJECT 1 MG UNDER THE SKIN ONCE A WEEK AS DIRECTED 3 mL 3   triamcinolone  ointment (KENALOG ) 0.5 % Apply 1 Application topically 2 (two) times daily as needed (stasis dermatitis.). 45 g 1   No current facility-administered medications on file prior to visit.    Past Medical History:  Diagnosis Date   Arthritis    Lower back pain   DM (diabetes mellitus) (HCC)    Gout    Hyperlipidemia    Hypertension    Morbid obesity with BMI of 50.0-59.9, adult (HCC)    Pulmonary embolism (HCC)    Sleep apnea treated with continuous positive airway pressure (CPAP)    Stasis dermatitis     No Known Allergies  Social History   Socioeconomic History   Marital status: Married    Spouse name: Not on file   Number of children: 0   Years of education: Not on file   Highest education level: Associate degree: occupational, Scientist, product/process development, or vocational program  Occupational History   Occupation:  Engineer, water  Tobacco Use   Smoking status: Never   Smokeless tobacco: Never  Vaping Use   Vaping status: Never Used  Substance and Sexual Activity   Alcohol use: Yes    Comment: occasional   Drug use: No   Sexual activity: Yes  Other Topics Concern   Not on file  Social History Narrative   Not on file   Social Drivers of Health   Financial Resource Strain: Medium Risk (10/11/2023)   Overall Financial Resource Strain (CARDIA)    Difficulty of Paying Living Expenses: Somewhat hard  Food Insecurity: No Food Insecurity (10/11/2023)   Hunger Vital Sign    Worried About Running Out of Food in the Last Year: Never true    Ran Out of Food in the Last Year: Never true  Transportation Needs: No Transportation Needs (10/11/2023)   PRAPARE - Administrator, Civil Service (Medical): No    Lack of Transportation (Non-Medical): No  Physical Activity: Insufficiently Active (  10/11/2023)   Exercise Vital Sign    Days of Exercise per Week: 2 days    Minutes of Exercise per Session: 20 min  Stress: No Stress Concern Present (10/11/2023)   [employer] of Occupational Health - Occupational Stress Questionnaire    Feeling of Stress: Not at all  Social Connections: Moderately Integrated (10/11/2023)   Social Connection and Isolation Panel    Frequency of Communication with Friends and Family: Twice a week    Frequency of Social Gatherings with Friends and Family: Once a week    Attends Religious Services: 1 to 4 times per year    Active Member of [employer] or Organizations: No    Attends Banker Meetings: Not on file    Marital Status: Married    Today's Vitals   10/11/23 1317  BP: 102/70  Pulse: 85  Resp: 16  Temp: 97.8 F (36.6 C)  TempSrc: Oral  SpO2: 98%  Weight: (!) 398 lb (180.5 kg)  Height: 6' (1.829 m)    Body mass index is 53.98 kg/m.  Physical Exam Nursing note reviewed.  Constitutional:      General: He is not in acute distress.     Appearance: He is well-developed.  HENT:     Head: Normocephalic and atraumatic.  Eyes:     Conjunctiva/sclera: Conjunctivae normal.  Cardiovascular:     Rate and Rhythm: Normal rate and regular rhythm.     Pulses:          Dorsalis pedis pulses are 2+ on the right side and 2+ on the left side.     Heart sounds: No murmur heard. Pulmonary:     Effort: Pulmonary effort is normal. No respiratory distress.     Breath sounds: Normal breath sounds.  Abdominal:     Palpations: Abdomen is soft. There is no hepatomegaly or mass.     Tenderness: There is no abdominal tenderness.  Feet:     Right foot:     Skin integrity: Ulcer present.     Left foot:     Skin integrity: Ulcer (weeping) present.  Lymphadenopathy:     Cervical: No cervical adenopathy.  Skin:    General: Skin is warm.     Findings: No erythema or rash.  Neurological:     Mental Status: He is alert and oriented to person, place, and time.     Cranial Nerves: No cranial nerve deficit.     Gait: Gait normal.  Psychiatric:     Comments: Well groomed, good eye contact.     ASSESSMENT AND PLAN: Mr.Celestine D Tavella was seen here today for chronic disease management.  Orders Placed This Encounter  Procedures   POC HgB A1c    No problem-specific Assessment & Plan notes found for this encounter.   No follow-ups on file.  I,Emily Lagle,acting as a Neurosurgeon for Kalven Ganim [place], MD.,have documented all relevant documentation on the behalf of Leta Bucklin [place], MD,as directed by  Tasheika Kitzmiller [place], MD while in the presence of Ausha Sieh [place], MD.  *** (refresh reminder)  I, Lindaann Gradilla [place], MD, have reviewed all documentation for this visit. The documentation on 10/11/23 for the exam, diagnosis, procedures, and orders are all accurate and complete. Emily Lagle [facility]. [place] office.

## 2023-10-12 DIAGNOSIS — D6859 Other primary thrombophilia: Secondary | ICD-10-CM | POA: Insufficient documentation

## 2023-10-12 NOTE — Assessment & Plan Note (Signed)
 Problem has been stable. Compression stockings daily. Continue appropriate skin care and lower extremity elevation.

## 2023-10-12 NOTE — Assessment & Plan Note (Signed)
 Last LDL 67 in 04/2022. He is not fasting today, so will plan on FLP next visit. Continue Atorvastatin  20 mg daily and low fat diet.

## 2023-10-12 NOTE — Assessment & Plan Note (Signed)
 Problem has been well controlled. HgA1C went from 6.4 to 6.8. We discussed other pharmacologic options to add, he prefers to hold adding a new agent and will work on dietary changes and increased physical activity. Continue Ozempic  1 mg weekly. Annual eye exam, periodic dental and foot care to continue. F/U in 5-6 months.

## 2023-10-12 NOTE — Assessment & Plan Note (Signed)
 BP adequately controlled. Continue Olmesartan  40 mg daily and hydrochlorothiazide  25 mg daily, which he also takes for LE edema. Eye exam is due.

## 2023-10-12 NOTE — Assessment & Plan Note (Signed)
 On chronic anticoagulation, Eliquis  5 mg bid, due to recurrent DVT/PE.

## 2023-11-21 ENCOUNTER — Other Ambulatory Visit (HOSPITAL_COMMUNITY): Payer: Self-pay

## 2023-11-21 ENCOUNTER — Telehealth: Payer: Self-pay

## 2023-11-21 NOTE — Telephone Encounter (Signed)
 Left voicemail for patient to return my call.

## 2023-11-21 NOTE — Telephone Encounter (Signed)
 Pharmacy Patient Advocate Encounter   Received notification from Onbase that prior authorization for Ozempic  4 is required/requested.   Insurance verification completed.   The patient is insured through [facility].   Per test claim: PA required and submitted KEY/EOC/Request #: [id number] from 11/21/23 to 11/20/24. Ran test claim, Copay is $24.99. This test claim was processed through [facility]- copay amounts may vary at other pharmacies due to pharmacy/plan contracts, or as the patient moves through the different stages of their insurance plan.

## 2023-11-22 ENCOUNTER — Other Ambulatory Visit: Payer: Self-pay | Admitting: Family Medicine

## 2023-11-22 DIAGNOSIS — E1169 Type 2 diabetes mellitus with other specified complication: Secondary | ICD-10-CM

## 2023-11-22 MED ORDER — OZEMPIC (1 MG/DOSE) 4 MG/3ML ~~LOC~~ SOPN: 1.0000 mg | PEN_INJECTOR | SUBCUTANEOUS | 3 refills | Status: DC

## 2023-11-22 NOTE — Telephone Encounter (Signed)
Rx sent to his pharmacy.  BJ

## 2023-11-22 NOTE — Telephone Encounter (Signed)
 Notified patient of approval. Patient states he still have one more pen left. Confirmed [facility] pharmacy. Patient voiced understanding.

## 2023-11-22 NOTE — Telephone Encounter (Signed)
 Left voicemail for patient to return my call.

## 2023-12-20 ENCOUNTER — Telehealth: Payer: Self-pay | Admitting: Family Medicine

## 2023-12-20 DIAGNOSIS — I872 Venous insufficiency (chronic) (peripheral): Secondary | ICD-10-CM

## 2023-12-20 NOTE — Telephone Encounter (Signed)
 Hx of venous stasis dermatitis and venous ulcers. Referral to wound clinic can be placed as requested. Thanks, BJ

## 2023-12-20 NOTE — Telephone Encounter (Signed)
 Copied from CRM [id number]. Topic: Referral - Request for Referral >> Dec 20, 2023  9:19 AM Larissa RAMAN wrote: Did the patient discuss referral with their provider in the last year? Yes (If No - schedule appointment) (If Yes - send message)  Appointment offered? Yes  Type of order/referral and detailed reason for visit: wound care  Preference of office, provider, location: [place] or [place]  If referral order, have you been seen by this specialty before? No (If Yes, this issue or another issue? When? Where?  Can we respond through MyChart? Yes

## 2023-12-21 NOTE — Telephone Encounter (Signed)
 Patient notified referral to wound care has been place. Patient voiced understanding.

## 2023-12-26 ENCOUNTER — Other Ambulatory Visit: Payer: Self-pay | Admitting: Family Medicine

## 2023-12-26 DIAGNOSIS — E1169 Type 2 diabetes mellitus with other specified complication: Secondary | ICD-10-CM

## 2024-01-09 ENCOUNTER — Encounter (HOSPITAL_BASED_OUTPATIENT_CLINIC_OR_DEPARTMENT_OTHER): Attending: General Surgery | Admitting: General Surgery

## 2024-01-09 DIAGNOSIS — I872 Venous insufficiency (chronic) (peripheral): Secondary | ICD-10-CM | POA: Insufficient documentation

## 2024-01-09 DIAGNOSIS — L97812 Non-pressure chronic ulcer of other part of right lower leg with fat layer exposed: Secondary | ICD-10-CM | POA: Insufficient documentation

## 2024-01-09 DIAGNOSIS — E11622 Type 2 diabetes mellitus with other skin ulcer: Secondary | ICD-10-CM | POA: Insufficient documentation

## 2024-01-09 DIAGNOSIS — I890 Lymphedema, not elsewhere classified: Secondary | ICD-10-CM | POA: Insufficient documentation

## 2024-01-16 ENCOUNTER — Encounter (HOSPITAL_BASED_OUTPATIENT_CLINIC_OR_DEPARTMENT_OTHER): Attending: General Surgery | Admitting: General Surgery

## 2024-01-16 DIAGNOSIS — E11622 Type 2 diabetes mellitus with other skin ulcer: Secondary | ICD-10-CM | POA: Diagnosis present

## 2024-01-16 DIAGNOSIS — L97812 Non-pressure chronic ulcer of other part of right lower leg with fat layer exposed: Secondary | ICD-10-CM | POA: Diagnosis not present

## 2024-01-16 DIAGNOSIS — I890 Lymphedema, not elsewhere classified: Secondary | ICD-10-CM | POA: Insufficient documentation

## 2024-01-16 DIAGNOSIS — I872 Venous insufficiency (chronic) (peripheral): Secondary | ICD-10-CM | POA: Insufficient documentation

## 2024-01-24 ENCOUNTER — Encounter (HOSPITAL_BASED_OUTPATIENT_CLINIC_OR_DEPARTMENT_OTHER): Admitting: Internal Medicine

## 2024-01-24 DIAGNOSIS — E11622 Type 2 diabetes mellitus with other skin ulcer: Secondary | ICD-10-CM | POA: Diagnosis not present

## 2024-02-02 ENCOUNTER — Encounter (HOSPITAL_BASED_OUTPATIENT_CLINIC_OR_DEPARTMENT_OTHER): Admitting: General Surgery

## 2024-02-02 DIAGNOSIS — E11622 Type 2 diabetes mellitus with other skin ulcer: Secondary | ICD-10-CM | POA: Diagnosis not present

## 2024-02-07 ENCOUNTER — Encounter (HOSPITAL_BASED_OUTPATIENT_CLINIC_OR_DEPARTMENT_OTHER): Admitting: General Surgery

## 2024-02-07 DIAGNOSIS — E11622 Type 2 diabetes mellitus with other skin ulcer: Secondary | ICD-10-CM | POA: Diagnosis not present

## 2024-02-23 ENCOUNTER — Other Ambulatory Visit: Payer: Self-pay | Admitting: Family Medicine

## 2024-02-23 DIAGNOSIS — M109 Gout, unspecified: Secondary | ICD-10-CM
